# Patient Record
Sex: Female | Born: 1985
Health system: Southern US, Community
[De-identification: ages and names within clinical notes are randomized; demographics above are authoritative.]

## PROBLEM LIST (undated history)

## (undated) ENCOUNTER — Inpatient Hospital Stay (HOSPITAL_COMMUNITY): Payer: Self-pay

## (undated) DIAGNOSIS — J45909 Unspecified asthma, uncomplicated: Secondary | ICD-10-CM

## (undated) DIAGNOSIS — I517 Cardiomegaly: Secondary | ICD-10-CM

## (undated) DIAGNOSIS — G500 Trigeminal neuralgia: Secondary | ICD-10-CM

## (undated) HISTORY — DX: Unspecified asthma, uncomplicated: J45.909

## (undated) HISTORY — DX: Trigeminal neuralgia: G50.0

## (undated) HISTORY — PX: TOOTH EXTRACTION: SUR596

---
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
## 2007-04-13 ENCOUNTER — Emergency Department (HOSPITAL_COMMUNITY): Admission: EM | Admit: 2007-04-13 | Discharge: 2007-04-13 | Payer: Self-pay | Admitting: *Deleted

## 2007-10-12 ENCOUNTER — Emergency Department (HOSPITAL_COMMUNITY): Admission: EM | Admit: 2007-10-12 | Discharge: 2007-10-12 | Payer: Self-pay | Admitting: Emergency Medicine

## 2010-10-18 NOTE — Op Note (Signed)
[place]  Patient:    Nicole Brooks, Nicole Brooks Visit Number: [id number] MRN: [id number]          Service Type: EMS Location: ED Attending Physician:  Corlis Leak. Dictated by:   Angelia Mould. Derrell Lolling, M.D. Proc. Date: 04/22/01 Admit Date:  02/17/2001 Discharge Date: 02/17/2001   CC:         Dyanne Carrel, M.D.   Operative Report  PREOPERATIVE DIAGNOSIS:  Enlarging soft tissue mass, left buttock, suspect lipoma, 1.5 cm diameter.  POSTOPERATIVE DIAGNOSIS:  Enlarging soft tissue mass, left buttock, suspect lipoma, 1.5 cm diameter.  OPERATION PERFORMED:  Excision of 1.5 cm soft tissue mass from left buttock.  SURGEON:  Angelia Mould. Derrell Lolling, M.D.  ANESTHESIA:  INDICATIONS FOR PROCEDURE:  This is a 25 year old black female who comes with her mother.  There is a soft tissue mass on the left buttock that has been enlarging.  It is on exam a 1.5 to 2.0 cm diameter area of raised skin which is soft and compressible but quite noticeable.  The overlying skin is thinned out but not ulcerated or inflamed.  This feels like a lipoma.  Both the patient and her mother would like this excised because of the cosmetic problem and because of the concern for diagnosis because it has been enlarging.  DESCRIPTION OF PROCEDURE:  The patient was brought to the minor procedure room at [facility] and placed prone.  The left buttock was prepped and draped in sterile fashion.  1% Xylocaine with epinephrine was used as a local infiltration anesthetic.  An oblique elliptical incision was made around this mass.  This incision was parallel to the gluteal crease. Dissection was carried down into the subcutaneous tissues where I excised a fatty mass consistent with a benign lipoma.  This was aggressively removed and sent for pathologic exam.  Hemostasis was excellent.  The skin was closed with a running subcuticular suture of 3-0 Monocryl and  Steri-Strips.  Clean bandages were placed and the recovery room in stable condition.  Estimated blood loss was about 3 cc.  Complications were none.  Sponge, needle and instrument counts were correct. Dictated by:   Angelia Mould. Derrell Lolling, M.D. Attending Physician:  Corlis Leak DD:  04/22/01 TD:  04/22/01 Job: [id number]

## 2011-03-31 ENCOUNTER — Emergency Department (HOSPITAL_COMMUNITY)
Admission: EM | Admit: 2011-03-31 | Discharge: 2011-03-31 | Disposition: A | Discharge status: Home / Self Care | Payer: Self-pay | Attending: Emergency Medicine | Admitting: Emergency Medicine

## 2011-03-31 ENCOUNTER — Emergency Department (HOSPITAL_COMMUNITY): Payer: Self-pay

## 2011-03-31 DIAGNOSIS — R079 Chest pain, unspecified: Secondary | ICD-10-CM | POA: Insufficient documentation

## 2011-03-31 DIAGNOSIS — R071 Chest pain on breathing: Secondary | ICD-10-CM | POA: Insufficient documentation

## 2011-03-31 DIAGNOSIS — R51 Headache: Secondary | ICD-10-CM | POA: Insufficient documentation

## 2011-03-31 DIAGNOSIS — R0602 Shortness of breath: Secondary | ICD-10-CM | POA: Insufficient documentation

## 2011-03-31 DIAGNOSIS — J45909 Unspecified asthma, uncomplicated: Secondary | ICD-10-CM | POA: Insufficient documentation

## 2011-03-31 LAB — DIFFERENTIAL
Basophils Absolute: 0 10*3/uL (ref 0.0–0.1)
Basophils Relative: 0 % (ref 0–1)
Eosinophils Absolute: 0.1 10*3/uL (ref 0.0–0.7)
Eosinophils Relative: 1 % (ref 0–5)
Lymphocytes Relative: 26 % (ref 12–46)
Lymphs Abs: 2 10*3/uL (ref 0.7–4.0)
Monocytes Absolute: 0.7 10*3/uL (ref 0.1–1.0)
Monocytes Relative: 9 % (ref 3–12)
Neutro Abs: 4.8 10*3/uL (ref 1.7–7.7)
Neutrophils Relative %: 63 % (ref 43–77)

## 2011-03-31 LAB — CBC
HCT: 37.8 % (ref 36.0–46.0)
Hemoglobin: 12.3 g/dL (ref 12.0–15.0)
MCH: 28.3 pg (ref 26.0–34.0)
MCHC: 32.5 g/dL (ref 30.0–36.0)
MCV: 87.1 fL (ref 78.0–100.0)
Platelets: 300 10*3/uL (ref 150–400)
RBC: 4.34 MIL/uL (ref 3.87–5.11)
RDW: 12.9 % (ref 11.5–15.5)
WBC: 7.6 10*3/uL (ref 4.0–10.5)

## 2011-03-31 LAB — BASIC METABOLIC PANEL
BUN: 12 mg/dL (ref 6–23)
CO2: 29 mEq/L (ref 19–32)
Chloride: 100 mEq/L (ref 96–112)
Creatinine, Ser: 0.61 mg/dL (ref 0.50–1.10)
Glucose, Bld: 99 mg/dL (ref 70–99)

## 2012-01-26 ENCOUNTER — Encounter (HOSPITAL_COMMUNITY): Payer: Self-pay | Admitting: Emergency Medicine

## 2012-01-26 ENCOUNTER — Emergency Department (HOSPITAL_COMMUNITY)
Admission: EM | Admit: 2012-01-26 | Discharge: 2012-01-26 | Disposition: A | Discharge status: Home / Self Care | Payer: No Typology Code available for payment source | Attending: Emergency Medicine | Admitting: Emergency Medicine

## 2012-01-26 DIAGNOSIS — Y92410 Unspecified street and highway as the place of occurrence of the external cause: Secondary | ICD-10-CM | POA: Insufficient documentation

## 2012-01-26 DIAGNOSIS — S139XXA Sprain of joints and ligaments of unspecified parts of neck, initial encounter: Secondary | ICD-10-CM | POA: Insufficient documentation

## 2012-01-26 DIAGNOSIS — Y998 Other external cause status: Secondary | ICD-10-CM | POA: Insufficient documentation

## 2012-01-26 DIAGNOSIS — S161XXA Strain of muscle, fascia and tendon at neck level, initial encounter: Secondary | ICD-10-CM

## 2012-01-26 DIAGNOSIS — Y93I9 Activity, other involving external motion: Secondary | ICD-10-CM | POA: Insufficient documentation

## 2012-01-26 MED ORDER — IBUPROFEN 800 MG PO TABS: 800.0000 mg | ORAL_TABLET | Freq: Three times a day (TID) | ORAL | Status: AC

## 2012-01-26 MED ORDER — CYCLOBENZAPRINE HCL 10 MG PO TABS
5.0000 mg | ORAL_TABLET | Freq: Once | ORAL | Status: AC
  Administered 2012-01-26: 5 mg via ORAL
  Filled 2012-01-26: qty 1

## 2012-01-26 MED ORDER — ACETAMINOPHEN-CODEINE #3 300-30 MG PO TABS: 1.0000 | ORAL_TABLET | Freq: Four times a day (QID) | ORAL | Status: AC | PRN

## 2012-01-26 MED ORDER — IBUPROFEN 400 MG PO TABS
600.0000 mg | ORAL_TABLET | Freq: Once | ORAL | Status: AC
  Administered 2012-01-26: 600 mg via ORAL
  Filled 2012-01-26: qty 1

## 2012-01-26 MED ORDER — CYCLOBENZAPRINE HCL 5 MG PO TABS: 5.0000 mg | ORAL_TABLET | Freq: Three times a day (TID) | ORAL | Status: AC | PRN
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
## 2012-01-26 NOTE — Progress Notes (Signed)
Orthopedic Tech Progress Note Patient Details:  Nicole Brooks February 24, 1986 [id number] Cervical collar applied, care instruction given Ortho Devices Type of Ortho Device: Aspen cervical collar Ortho Device/Splint Location: Cervial collar applied Ortho Device/Splint Interventions: Application   Asia R Thompson 01/26/2012, 11:35 AM
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
## 2012-02-10 ENCOUNTER — Ambulatory Visit: Payer: No Typology Code available for payment source | Admitting: Physical Therapy

## 2012-02-17 ENCOUNTER — Ambulatory Visit: Payer: No Typology Code available for payment source | Attending: Sports Medicine | Admitting: Physical Therapy

## 2012-02-17 DIAGNOSIS — R293 Abnormal posture: Secondary | ICD-10-CM | POA: Insufficient documentation

## 2012-02-17 DIAGNOSIS — M2550 Pain in unspecified joint: Secondary | ICD-10-CM | POA: Insufficient documentation

## 2012-02-17 DIAGNOSIS — IMO0001 Reserved for inherently not codable concepts without codable children: Secondary | ICD-10-CM | POA: Insufficient documentation

## 2012-02-23 ENCOUNTER — Ambulatory Visit: Payer: No Typology Code available for payment source | Admitting: Physical Therapy

## 2012-02-25 ENCOUNTER — Encounter: Payer: Self-pay | Admitting: Physical Therapy

## 2012-02-26 ENCOUNTER — Encounter: Payer: Self-pay | Admitting: Physical Therapy

## 2012-03-02 ENCOUNTER — Ambulatory Visit: Payer: No Typology Code available for payment source | Attending: Sports Medicine | Admitting: Physical Therapy

## 2012-03-02 DIAGNOSIS — IMO0001 Reserved for inherently not codable concepts without codable children: Secondary | ICD-10-CM | POA: Insufficient documentation

## 2012-03-02 DIAGNOSIS — R293 Abnormal posture: Secondary | ICD-10-CM | POA: Insufficient documentation

## 2012-03-02 DIAGNOSIS — M2550 Pain in unspecified joint: Secondary | ICD-10-CM | POA: Insufficient documentation

## 2012-03-08 ENCOUNTER — Ambulatory Visit: Payer: No Typology Code available for payment source

## 2012-03-15 ENCOUNTER — Ambulatory Visit: Payer: No Typology Code available for payment source | Admitting: Rehabilitative and Restorative Service Providers"

## 2012-03-17 ENCOUNTER — Ambulatory Visit: Payer: No Typology Code available for payment source | Admitting: Rehabilitation

## 2012-03-23 ENCOUNTER — Ambulatory Visit: Payer: No Typology Code available for payment source | Admitting: Rehabilitation

## 2012-04-15 ENCOUNTER — Encounter (HOSPITAL_COMMUNITY): Payer: Self-pay | Admitting: Emergency Medicine

## 2012-04-15 ENCOUNTER — Emergency Department (HOSPITAL_COMMUNITY)
Admission: EM | Admit: 2012-04-15 | Discharge: 2012-04-15 | Disposition: A | Discharge status: Home / Self Care | Payer: Medicaid Other | Attending: Emergency Medicine | Admitting: Emergency Medicine

## 2012-04-15 DIAGNOSIS — Z3490 Encounter for supervision of normal pregnancy, unspecified, unspecified trimester: Secondary | ICD-10-CM

## 2012-04-15 DIAGNOSIS — Z3201 Encounter for pregnancy test, result positive: Secondary | ICD-10-CM | POA: Insufficient documentation

## 2012-04-15 DIAGNOSIS — N949 Unspecified condition associated with female genital organs and menstrual cycle: Secondary | ICD-10-CM | POA: Insufficient documentation

## 2012-04-15 DIAGNOSIS — N938 Other specified abnormal uterine and vaginal bleeding: Secondary | ICD-10-CM | POA: Insufficient documentation

## 2012-04-15 MED ORDER — FOLIC ACID 800 MCG PO TABS: 800.0000 ug | ORAL_TABLET | Freq: Every day | ORAL | Status: DC

## 2012-04-15 NOTE — ED Notes (Addendum)
Pt states she has taken 5 positive home pregnancy tests, "wants to make sure she is really pregnant". C/o abd pain for several days "when I'm sleeping".

## 2012-04-15 NOTE — ED Provider Notes (Signed)
History     CSN: [id number]  Arrival date & time 04/15/12  8657   First MD Initiated Contact with Patient 04/15/12 [phone]      Chief Complaint  Patient presents with  . Possible Pregnancy    (Consider location/radiation/quality/duration/timing/severity/associated sxs/prior treatment) HPI Comments: Patient presents with request for pregnancy test. Patient states that she took 5 tests at home but wanted confirmation from a hospital. Patient is currently taking prenatal vitamins. Denies abdominal pain, vaginal bleeding, or vaginal discharge. LMP: September 15th.  The history is provided by the patient. No language interpreter was used.    History reviewed. No pertinent past medical history.  History reviewed. No pertinent past surgical history.  History reviewed. No pertinent family history.  History  Substance Use Topics  . Smoking status: Never Smoker   . Smokeless tobacco: Not on file  . Alcohol Use: Yes     Comment: occasional    OB History    Grav Para Term Preterm Abortions TAB SAB Ect Mult Living                  Review of Systems  Gastrointestinal: Negative for abdominal pain.  Genitourinary: Positive for menstrual problem. Negative for vaginal bleeding and vaginal discharge.    Allergies  Review of patient's allergies indicates no known allergies.  Home Medications   Current Outpatient Rx  Name  Route  Sig  Dispense  Refill  . PRENATAL MULTIVITAMIN CH   Oral   Take 1 tablet by mouth daily.           BP 110/63  Pulse 95  Temp 98.4 F (36.9 C) (Oral)  Resp 16  SpO2 100%  LMP 01/14/2012  Physical Exam  Constitutional: She appears well-developed and well-nourished.  HENT:  Head: Normocephalic and atraumatic.  Mouth/Throat: Oropharynx is clear and moist.  Eyes: Conjunctivae normal and EOM are normal. No scleral icterus.  Neck: Normal range of motion. Neck supple.  Cardiovascular: Normal rate, regular rhythm and normal heart sounds.     Pulmonary/Chest: Effort normal and breath sounds normal.  Abdominal: Soft. Bowel sounds are normal. There is no tenderness.  Neurological: She is alert.  Skin: Skin is warm and dry.    ED Course  Procedures (including critical care time)  Labs Reviewed - No data to display No results found. Results for orders placed during the hospital encounter of 04/15/12  POCT PREGNANCY, URINE      Component Value Range   Preg Test, Ur POSITIVE (*) NEGATIVE     1. Pregnancy       MDM  Patient presented with request for confirmatory pregnancy test. Patient informed of POC results. Patient given Rx for supplemental folic acid and to continue taking prenatal vitamins. Referred to [facility] for first OB visit. Discharged with return precautions.         Pixie Casino, PA-C 04/15/12 1034

## 2012-04-16 NOTE — ED Provider Notes (Signed)
Medical screening examination/treatment/procedure(s) were performed by non-physician practitioner and as supervising physician I was immediately available for consultation/collaboration.   Tor Tsuda, MD 04/16/12 0809 

## 2012-05-12 ENCOUNTER — Ambulatory Visit (INDEPENDENT_AMBULATORY_CARE_PROVIDER_SITE_OTHER): Payer: Self-pay | Admitting: Obstetrics & Gynecology

## 2012-05-12 ENCOUNTER — Encounter: Payer: Self-pay | Admitting: Advanced Practice Midwife

## 2012-05-12 VITALS — BP 122/78 | Temp 99.2°F | Ht 61.0 in | Wt 176.0 lb

## 2012-05-12 DIAGNOSIS — B353 Tinea pedis: Secondary | ICD-10-CM

## 2012-05-12 DIAGNOSIS — Z3400 Encounter for supervision of normal first pregnancy, unspecified trimester: Secondary | ICD-10-CM

## 2012-05-12 DIAGNOSIS — O021 Missed abortion: Secondary | ICD-10-CM | POA: Insufficient documentation

## 2012-05-12 DIAGNOSIS — Z23 Encounter for immunization: Secondary | ICD-10-CM

## 2012-05-12 DIAGNOSIS — Z3490 Encounter for supervision of normal pregnancy, unspecified, unspecified trimester: Secondary | ICD-10-CM

## 2012-05-12 DIAGNOSIS — G43909 Migraine, unspecified, not intractable, without status migrainosus: Secondary | ICD-10-CM | POA: Insufficient documentation

## 2012-05-12 LAB — POCT URINALYSIS DIP (DEVICE)
Leukocytes, UA: NEGATIVE
Protein, ur: NEGATIVE mg/dL
Specific Gravity, Urine: 1.025 (ref 1.005–1.030)
pH: 6.5 (ref 5.0–8.0)

## 2012-05-12 MED ORDER — INFLUENZA VIRUS VACC SPLIT PF IM SUSP: 0.5000 mL | Freq: Once | INTRAMUSCULAR | Status: DC

## 2012-05-12 MED ORDER — CLOTRIMAZOLE-BETAMETHASONE 1-0.05 % EX CREA: TOPICAL_CREAM | Freq: Two times a day (BID) | CUTANEOUS | Status: DC

## 2012-05-12 MED ORDER — BUTALBITAL-APAP-CAFFEINE 50-325-40 MG PO TABS: 1.0000 | ORAL_TABLET | Freq: Two times a day (BID) | ORAL | Status: DC | PRN

## 2012-05-12 NOTE — Progress Notes (Signed)
   Subjective:    Nicole Brooks is a G2P0010 [redacted]w[redacted]d being seen today for her first obstetrical visit.  Her obstetrical history is significant for None. Patient does not intend to breast feed. Pregnancy history fully reviewed.  Patient reports Headaches with photo and scent sensitivity, and athletes foot. Marland Kitchen Headaches daily for 1 month, onset over and hour or so and last all fday long, some nausea previously, + photo and scent sensitivity.  Family Hx + for gestational diabetes, T2DM, and HTN  Filed Vitals:   05/12/12 0923 05/12/12 0927  BP: 122/78   Temp: 99.2 F (37.3 C)   Height:  5\' 1"  (1.549 m)  Weight: 176 lb (79.833 kg)     HISTORY: OB History    Grav Para Term Preterm Abortions TAB SAB Ect Mult Living   2 0 0 0 1 0 1 0 0 0      # Outc Date GA Lbr Len/2nd Wgt Sex Del Anes PTL Lv   1 SAB 2011           2 CUR              Past Medical History  Diagnosis Date  . Asthma    Past Surgical History  Procedure Date  . No past surgeries    Family History  Problem Relation Age of Onset  . Diabetes Mother   . Hypertension Mother      Exam    Uterus:     Pelvic Exam:    Perineum: Normal Perineum   Vulva: normal   Vagina:  Deferred   pH: Deferred   Cervix: Deferred   Adnexa: Deferred   Bony Pelvis: Deferred  System: Breast:  Deferred   Skin: normal coloration and turgor, no rashes    Neurologic: oriented, normal, grossly non-focal   Extremities: no deformities, No edema, areas of fine scale and hypopigmentation between toes BL   HEENT PERRLA, extra ocular movement intact and sclera clear, anicteric   Mouth/Teeth mucous membranes moist, pharynx normal without lesions   Neck supple and no masses   Cardiovascular: regular rate and rhythm, no murmurs or gallops   Respiratory:  appears well, vitals normal, no respiratory distress, acyanotic, normal RR, ear and throat exam is normal, neck free of mass or lymphadenopathy, chest clear, no wheezing, crepitations, rhonchi,  normal symmetric air entry   Abdomen: Pregnant, fundal height 2 finger breadths below umbilicus   Urinary: urethral meatus normal      Assessment:    Pregnancy: G2P0010 Patient Active Problem List  Diagnosis  . Supervision of normal pregnancy        Plan:     Initial labs drawn. Prenatal vitamins. Problem list reviewed and updated. Genetic Screening discussed Quad Screen: Deferred- see below.  Ultrasound discussed; fetal survey: Deferred- see below. .  Follow up in 4 weeks. 50% of 30 min visit spent on counseling and coordination of care.  Urine with trace blood and protein- Culture Headaches likely migraine with duration and photosensitivity- will Treat with Fioricet RX given Athletes foot for years, Lamasil PO not an option with pregnancy, has used lamasil cream for years. Will give Rx for Lotrizone cream, also suggest Tea tree oil.   Missed AB- Fetal heart tone not audible with doppler US, Bedside US confirms approx 12 week 4 days by CRL and no heartbeat. Will plan D&C.   Kevin Fenton 05/12/2012

## 2012-05-12 NOTE — Progress Notes (Signed)
P = 88  Patient complains of headaches.

## 2012-05-12 NOTE — Progress Notes (Signed)
Pt with missed Ab @12  weeks.  D/W pt method of delivery.  She has opted for D&C.  Will schedule.   Nicole Brooks L. Harraway-Smith, M.D., [place]

## 2012-05-12 NOTE — Patient Instructions (Addendum)
Incomplete Miscarriage Miscarriages in pregnancy are common. A miscarriage is a pregnancy that has ended before the twentieth week. You have had an incomplete miscarriage. Partial parts of the fetus or placenta (afterbirth) remain behind. Sometimes further treatment is needed. The most common reason for further treatment is continued bleeding (hemorrhage). Tissue left behind may also become infected. Treatment usually is curettage. Curettage for an incomplete abortion is a procedure in which the remaining products of pregnancy are removed. This can be done by a simple sucking procedure (suction curettage). It can also be done by a simple scraping (curettage) of the inside of the uterus (womb). This may be done in the hospital or in the caregiver's office. This is only done when your caregiver knows the pregnancy has ended. This is determined by physical examination and a negative pregnancy test. It may also include an ultrasound to confirm a dead fetus. The ultrasound may also prove that products of the pregnancy remain in the uterus. If your cervix remains dilated and you are still passing clots and tissue, your caregiver may wish to watch you for a little while. Your caregiver may want to see if you are going to finish passing all of the remaining parts of the pregnancy. If the bleeding continues, they may proceed with curettage. WHY DO I FEEL THIS WAY Miscarriages can be a very emotional time for prospective mothers. This is not you or your partner's fault. The miscarriage did not occur because of a lack in you or your partner. Nearly all miscarriages occur because the pregnancy has started off wrongly. At least half of miscarried pregnancies have a chromosomal abnormality (almost always not inherited). Others may have developmental problems with the fetus or placentas. Problems may not show up even when the products miscarried are studied under the microscope. You can usually begin trying for another  pregnancy as soon as your caregiver says it's okay. HOME CARE INSTRUCTIONS   Your caregiver may order bed rest (this means only getting up to use the bathroom). Your caregiver may allow you to continue light activity. If curettage was not done at this time, but you require further treatment.  Keep track of the number of pads you use each day. Keep track of how saturated (soaked) they are. Record this information.  Do not use tampons. Do not douche or have sexual intercourse until approved by your caregiver.  It is very important to keep all follow-up appointments for re-evaluation and continuing management.  Women who have an Rh negative blood type (ie, A, B, AB, or O negative) need to receive a drug called Rh(D) immune globulin. This medicine helps protect future fetuses against problems that can occur if an Rh negative mother is carrying a baby who is Rh positive. SEEK IMMEDIATE MEDICAL CARE IF:   You experience severe cramps in your stomach, back, or abdomen.  You run an unexplained temperature (record these).  You pass large clots or tissue (save any tissue for your caregiver to inspect).  Your bleeding increases or you become light-headed, weak, or have fainting episodes. MAKE SURE YOU:   Understand these instructions.  Will watch your condition.  Will get help right away if you are not doing well or get worse. Document Released: 05/19/2005 Document Revised: 08/11/2011 Document Reviewed: 01/07/2008 [facility] Patient Information 2013 [place], [place]. Recurrent Miscarriage Recurrent miscarriage means that a woman has lost two or more pregnancies in a row. The loss happens before 20 weeks of pregnancy. Primary recurrent miscarriage is with a woman  that has never been able to give birth to a child. Secondary recurrent miscarriage is a woman who had a child and then had two or more miscarriages in a row.  CAUSES   Your parents passed it to you (genetic).  Chromosomal  defects.  Endocrine disorders. A person's endocrine system includes glands that make hormones.  Having a lack of progesterone hormone in early pregnancy.  Thyroid problems.  Insulin resistance seen in diabetic women and women with Polycystic Ovary Syndrome that is hard to control.  Abnormalities of the uterus.  Certain viral and germ (bacterial) infections.  Autoimmune disorders. This is when the immune system attacks or destroys healthy body tissue.  Smoking, drinking or taking drugs or medicines.  Being exposed to certain chemicals or toxins at home or work. HOME CARE INSTRUCTIONS   See your caregiver if you have two or more miscarriages.  Follow the advice for testing and treatment that your caregiver recommends.  Discuss any concerns or questions with your caregiver.  Do not smoke or drink alcohol when pregnant.  Recurrent miscarriages can have a severe emotional and psychological effect on a couple wanting to have children. They may have feelings of anger, blame, guilt and become depressed after losing a pregnancy many times. Join a support group or see a grief counselor if you have emotional problems because of pregnancy loss. SEEK MEDICAL CARE IF:   You are or think you are pregnant and have any kind of vaginal spotting or bleeding.  You develop low abdominal cramps.  You develop a fever of 102 F (38.9 C) or higher.  You develop abnormal vaginal discharge.  You have been or think you have been exposed to a spreadable (contagious) illness, toxins or chemicals that make you sick to your stomach (nauseated) or throw up (vomit).  You think you have been exposed to a sexually transmitted disease. Document Released: 11/05/2007 Document Revised: 08/11/2011 Document Reviewed: 11/05/2007 [facility] Patient Information 2013 [place], [place].

## 2012-05-13 LAB — OBSTETRIC PANEL
Antibody Screen: NEGATIVE
Basophils Relative: 0 % (ref 0–1)
Eosinophils Absolute: 0.1 10*3/uL (ref 0.0–0.7)
HCT: 34.2 % — ABNORMAL LOW (ref 36.0–46.0)
Hemoglobin: 11.2 g/dL — ABNORMAL LOW (ref 12.0–15.0)
Lymphs Abs: 1.2 10*3/uL (ref 0.7–4.0)
MCH: 27.7 pg (ref 26.0–34.0)
MCHC: 32.7 g/dL (ref 30.0–36.0)
MCV: 84.4 fL (ref 78.0–100.0)
Monocytes Absolute: 0.5 10*3/uL (ref 0.1–1.0)
Monocytes Relative: 7 % (ref 3–12)
Rh Type: POSITIVE

## 2012-05-14 LAB — HEMOGLOBINOPATHY EVALUATION
Hemoglobin Other: 0 %
Hgb A2 Quant: 3 % (ref 2.2–3.2)
Hgb A: 96.6 % — ABNORMAL LOW (ref 96.8–97.8)
Hgb S Quant: 0 %

## 2012-05-14 LAB — CULTURE, OB URINE: Organism ID, Bacteria: NO GROWTH

## 2012-05-17 ENCOUNTER — Encounter (HOSPITAL_COMMUNITY): Payer: Self-pay | Admitting: Registered Nurse

## 2012-05-17 ENCOUNTER — Encounter (HOSPITAL_COMMUNITY)
Admission: RE | Disposition: A | Discharge status: Home / Self Care | Payer: Self-pay | Source: Ambulatory Visit | Attending: Obstetrics & Gynecology

## 2012-05-17 ENCOUNTER — Ambulatory Visit (HOSPITAL_COMMUNITY): Payer: Medicaid Other | Admitting: Registered Nurse

## 2012-05-17 ENCOUNTER — Encounter (HOSPITAL_COMMUNITY): Payer: Self-pay | Admitting: *Deleted

## 2012-05-17 ENCOUNTER — Ambulatory Visit (HOSPITAL_COMMUNITY)
Admission: RE | Admit: 2012-05-17 | Discharge: 2012-05-17 | Disposition: A | Discharge status: Home / Self Care | Payer: Medicaid Other | Source: Ambulatory Visit | Attending: Obstetrics & Gynecology | Admitting: Obstetrics & Gynecology

## 2012-05-17 DIAGNOSIS — O021 Missed abortion: Secondary | ICD-10-CM | POA: Insufficient documentation

## 2012-05-17 HISTORY — PX: DILATION AND EVACUATION: SHX1459

## 2012-05-17 LAB — CBC
HCT: 33.4 % — ABNORMAL LOW (ref 36.0–46.0)
MCHC: 33.5 g/dL (ref 30.0–36.0)
MCV: 83.5 fL (ref 78.0–100.0)
RDW: 12.2 % (ref 11.5–15.5)

## 2012-05-17 SURGERY — DILATION AND EVACUATION, UTERUS
Anesthesia: Monitor Anesthesia Care | Site: Vagina | Wound class: Clean Contaminated

## 2012-05-17 MED ORDER — PROPOFOL 10 MG/ML IV EMUL
INTRAVENOUS | Status: DC | PRN
  Administered 2012-05-17: 10 mg via INTRAVENOUS
  Administered 2012-05-17: 30 mg via INTRAVENOUS
  Administered 2012-05-17: 20 mg via INTRAVENOUS
  Administered 2012-05-17: 30 mg via INTRAVENOUS
  Administered 2012-05-17: 20 mg via INTRAVENOUS

## 2012-05-17 MED ORDER — ONDANSETRON HCL 4 MG/2ML IJ SOLN
INTRAMUSCULAR | Status: DC | PRN
  Administered 2012-05-17: 4 mg via INTRAVENOUS

## 2012-05-17 MED ORDER — KETOROLAC TROMETHAMINE 30 MG/ML IJ SOLN
INTRAMUSCULAR | Status: DC | PRN
  Administered 2012-05-17: 30 mg via INTRAVENOUS

## 2012-05-17 MED ORDER — ONDANSETRON HCL 4 MG/2ML IJ SOLN
INTRAMUSCULAR | Status: AC
  Filled 2012-05-17: qty 2

## 2012-05-17 MED ORDER — FENTANYL CITRATE 0.05 MG/ML IJ SOLN
INTRAMUSCULAR | Status: AC
  Filled 2012-05-17: qty 2

## 2012-05-17 MED ORDER — MIDAZOLAM HCL 5 MG/5ML IJ SOLN
INTRAMUSCULAR | Status: DC | PRN
  Administered 2012-05-17: 2 mg via INTRAVENOUS

## 2012-05-17 MED ORDER — DEXAMETHASONE SODIUM PHOSPHATE 10 MG/ML IJ SOLN
INTRAMUSCULAR | Status: DC | PRN
  Administered 2012-05-17: 10 mg via INTRAVENOUS

## 2012-05-17 MED ORDER — KETOROLAC TROMETHAMINE 30 MG/ML IJ SOLN: 15.0000 mg | Freq: Once | INTRAMUSCULAR | Status: DC | PRN

## 2012-05-17 MED ORDER — FENTANYL CITRATE 0.05 MG/ML IJ SOLN
INTRAMUSCULAR | Status: AC
  Administered 2012-05-17: 50 ug via INTRAVENOUS
  Filled 2012-05-17: qty 2

## 2012-05-17 MED ORDER — METHYLERGONOVINE MALEATE 0.2 MG/ML IJ SOLN
INTRAMUSCULAR | Status: DC | PRN
  Administered 2012-05-17: 0.2 mg via INTRAMUSCULAR

## 2012-05-17 MED ORDER — BUPIVACAINE HCL (PF) 0.5 % IJ SOLN
INTRAMUSCULAR | Status: AC
  Filled 2012-05-17: qty 30

## 2012-05-17 MED ORDER — LIDOCAINE HCL (CARDIAC) 20 MG/ML IV SOLN
INTRAVENOUS | Status: AC
  Filled 2012-05-17: qty 5

## 2012-05-17 MED ORDER — LIDOCAINE HCL (CARDIAC) 20 MG/ML IV SOLN
INTRAVENOUS | Status: DC | PRN
  Administered 2012-05-17: 50 mg via INTRAVENOUS

## 2012-05-17 MED ORDER — ONDANSETRON HCL 4 MG/2ML IJ SOLN: 4.0000 mg | Freq: Once | INTRAMUSCULAR | Status: DC | PRN

## 2012-05-17 MED ORDER — LACTATED RINGERS IV SOLN
INTRAVENOUS | Status: DC
  Administered 2012-05-17: 09:00:00 via INTRAVENOUS

## 2012-05-17 MED ORDER — OXYCODONE-ACETAMINOPHEN 5-325 MG PO TABS: 1.0000 | ORAL_TABLET | Freq: Four times a day (QID) | ORAL | Status: DC | PRN

## 2012-05-17 MED ORDER — IBUPROFEN 600 MG PO TABS: 600.0000 mg | ORAL_TABLET | Freq: Four times a day (QID) | ORAL | Status: DC | PRN

## 2012-05-17 MED ORDER — DOXYCYCLINE HYCLATE 100 MG PO CAPS: 100.0000 mg | ORAL_CAPSULE | Freq: Two times a day (BID) | ORAL | Status: DC

## 2012-05-17 MED ORDER — PROPOFOL 10 MG/ML IV EMUL
INTRAVENOUS | Status: AC
  Filled 2012-05-17: qty 20

## 2012-05-17 MED ORDER — LACTATED RINGERS IV SOLN
INTRAVENOUS | Status: DC
  Administered 2012-05-17 (×2): via INTRAVENOUS

## 2012-05-17 MED ORDER — MIDAZOLAM HCL 2 MG/2ML IJ SOLN
INTRAMUSCULAR | Status: AC
  Filled 2012-05-17: qty 2

## 2012-05-17 MED ORDER — FENTANYL CITRATE 0.05 MG/ML IJ SOLN
INTRAMUSCULAR | Status: DC | PRN
  Administered 2012-05-17 (×2): 50 ug via INTRAVENOUS
  Administered 2012-05-17: 100 ug via INTRAVENOUS

## 2012-05-17 MED ORDER — METHYLERGONOVINE MALEATE 0.2 MG/ML IJ SOLN
INTRAMUSCULAR | Status: AC
  Filled 2012-05-17: qty 1

## 2012-05-17 MED ORDER — BUPIVACAINE HCL (PF) 0.5 % IJ SOLN
INTRAMUSCULAR | Status: DC | PRN
  Administered 2012-05-17: 22 mL

## 2012-05-17 MED ORDER — KETOROLAC TROMETHAMINE 30 MG/ML IJ SOLN
INTRAMUSCULAR | Status: AC
  Filled 2012-05-17: qty 1

## 2012-05-17 MED ORDER — MEPERIDINE HCL 25 MG/ML IJ SOLN: 6.2500 mg | INTRAMUSCULAR | Status: DC | PRN

## 2012-05-17 MED ORDER — FENTANYL CITRATE 0.05 MG/ML IJ SOLN
25.0000 ug | INTRAMUSCULAR | Status: DC | PRN
  Administered 2012-05-17: 50 ug via INTRAVENOUS

## 2012-05-17 SURGICAL SUPPLY — 23 items
CATH ROBINSON RED A/P 16FR (CATHETERS) ×2 IMPLANT
CLOTH BEACON ORANGE TIMEOUT ST (SAFETY) ×2 IMPLANT
DECANTER SPIKE VIAL GLASS SM (MISCELLANEOUS) ×2 IMPLANT
GLOVE BIOGEL PI IND STRL 7.0 (GLOVE) ×1 IMPLANT
GLOVE BIOGEL PI INDICATOR 7.0 (GLOVE) ×1
GLOVE ECLIPSE 7.0 STRL STRAW (GLOVE) ×4 IMPLANT
GOWN PREVENTION PLUS XLARGE (GOWN DISPOSABLE) ×2 IMPLANT
GOWN STRL REIN XL XLG (GOWN DISPOSABLE) ×4 IMPLANT
KIT BERKELEY 1ST TRIMESTER 3/8 (MISCELLANEOUS) ×2 IMPLANT
NDL SPNL 22GX3.5 QUINCKE BK (NEEDLE) ×1 IMPLANT
NEEDLE SPNL 22GX3.5 QUINCKE BK (NEEDLE) ×2 IMPLANT
NS IRRIG 1000ML POUR BTL (IV SOLUTION) ×2 IMPLANT
PACK VAGINAL MINOR WOMEN LF (CUSTOM PROCEDURE TRAY) ×2 IMPLANT
PAD OB MATERNITY 4.3X12.25 (PERSONAL CARE ITEMS) ×2 IMPLANT
PAD PREP 24X48 CUFFED NSTRL (MISCELLANEOUS) ×2 IMPLANT
SET BERKELEY SUCTION TUBING (SUCTIONS) ×2 IMPLANT
SYR CONTROL 10ML LL (SYRINGE) ×2 IMPLANT
TOWEL OR 17X24 6PK STRL BLUE (TOWEL DISPOSABLE) ×4 IMPLANT
VACURETTE 10 RIGID CVD (CANNULA) IMPLANT
VACURETTE 12 RIGID CVD (CANNULA) ×1 IMPLANT
VACURETTE 7MM CVD STRL WRAP (CANNULA) IMPLANT
VACURETTE 8 RIGID CVD (CANNULA) IMPLANT
VACURETTE 9 RIGID CVD (CANNULA) IMPLANT

## 2012-05-17 NOTE — Anesthesia Preprocedure Evaluation (Signed)
Anesthesia Evaluation  Patient identified by MRN, date of birth, ID band Patient awake    Reviewed: Allergy & Precautions, H&P , NPO status , Patient's Chart, lab work & pertinent test results  Airway Mallampati: I TM Distance: >3 FB Neck ROM: full    Dental No notable dental hx.    Pulmonary    Pulmonary exam normal       Cardiovascular negative cardio ROS      Neuro/Psych negative psych ROS   GI/Hepatic negative GI ROS, Neg liver ROS,   Endo/Other  negative endocrine ROS  Renal/GU negative Renal ROS  negative genitourinary   Musculoskeletal negative musculoskeletal ROS (+)   Abdominal Normal abdominal exam  (+)   Peds  Hematology negative hematology ROS (+)   Anesthesia Other Findings   Reproductive/Obstetrics                           Anesthesia Physical Anesthesia Plan  ASA: II  Anesthesia Plan: MAC   Post-op Pain Management:    Induction: Intravenous  Airway Management Planned:   Additional Equipment:   Intra-op Plan:   Post-operative Plan:   Informed Consent: I have reviewed the patients History and Physical, chart, labs and discussed the procedure including the risks, benefits and alternatives for the proposed anesthesia with the patient or authorized representative who has indicated his/her understanding and acceptance.     Plan Discussed with: CRNA and Surgeon  Anesthesia Plan Comments:         Anesthesia Quick Evaluation

## 2012-05-17 NOTE — Transfer of Care (Signed)
Immediate Anesthesia Transfer of Care Note  Patient: Nicole Brooks  Procedure(s) Performed: Procedure(s) (LRB) with comments: DILATATION AND EVACUATION (N/A)  Patient Location: PACU  Anesthesia Type:MAC  Level of Consciousness: sedated  Airway & Oxygen Therapy: Patient Spontanous Breathing and Patient connected to nasal cannula oxygen  Post-op Assessment: Report given to PACU RN  Post vital signs: Reviewed and stable  Complications: No apparent anesthesia complications

## 2012-05-17 NOTE — Brief Op Note (Signed)
05/17/2012  9:29 AM  PATIENT:  Nicole Brooks  26 y.o. female  PRE-OPERATIVE DIAGNOSIS:  12 weeks;missed ab  POST-OPERATIVE DIAGNOSIS:  missed abortion  PROCEDURE:  Procedure(s) (LRB) with comments: DILATATION AND EVACUATION (N/A)  SURGEON:  Surgeon(s) and Role:    * Willodean Rosenthal, MD - Primary  ANESTHESIA:   general and paracervical block  EBL:  Total I/O In: 1000 [I.V.:1000] Out: 300 [Urine:100; Blood:200]  BLOOD ADMINISTERED:none  DRAINS: none   LOCAL MEDICATIONS USED:  MARCAINE     SPECIMEN:  Source of Specimen:  products of conception  DISPOSITION OF SPECIMEN:  PATHOLOGY  COUNTS:  YES  TOURNIQUET:  * No tourniquets in log *  DICTATION: .Note written in EPIC  PLAN OF CARE: Discharge to home after PACU  PATIENT DISPOSITION:  PACU - hemodynamically stable.   Delay start of Pharmacological VTE agent (>24hrs) due to surgical blood loss or risk of bleeding: not applicable

## 2012-05-17 NOTE — Anesthesia Postprocedure Evaluation (Signed)
Anesthesia Post Note  Patient: Nicole Brooks  Procedure(s) Performed: Procedure(s) (LRB): DILATATION AND EVACUATION (N/A)  Anesthesia type: MAC  Patient location: PACU  Post pain: Pain level controlled  Post assessment: Post-op Vital signs reviewed  Last Vitals:  Filed Vitals:   05/17/12 0935  BP: 120/68  Pulse:   Temp: 36.8 C  Resp: 16    Post vital signs: Reviewed  Level of consciousness: sedated  Complications: No apparent anesthesia complications

## 2012-05-17 NOTE — Op Note (Signed)
Nicole Brooks PROCEDURE DATE: 05/17/2012  PREOPERATIVE DIAGNOSIS: 12 week missed abortion POSTOPERATIVE DIAGNOSIS: The same PROCEDURE:     Dilation and Evacuation SURGEON:  Dr. Eber Jones L. Harraway-Smith  INDICATIONS: 26 y.o. G2P0010 with MAB at [redacted] weeks gestation   Risks of surgery were discussed with the patient including but not limited to: bleeding which may require transfusion; infection which may require antibiotics; injury to uterus or surrounding organs; need for additional procedures including laparotomy or laparoscopy; possibility of intrauterine scarring which may impair future fertility; and other postoperative/anesthesia complications. Written informed consent was obtained.    FINDINGS:  A 12week size uterus, moderate amounts of products of conception, specimen sent to pathology.  ANESTHESIA:    Monitored intravenous sedation, paracervical block. INTRAVENOUS FLUIDS:  1000 ml of LR ESTIMATED BLOOD LOSS:  Less than 20 ml. SPECIMENS:  Products of conception sent to pathology COMPLICATIONS:  None immediate.  PROCEDURE DETAILS:  The patient was taken to the operating room where monitored intravenous sedation was administered and was found to be adequate.  After an adequate timeout was performed, she was placed in the dorsal lithotomy position and examined; then prepped and draped in the sterile manner.   Her bladder was catheterized for an unmeasured amount of clear, yellow urine. A vaginal speculum was then placed in the patient's vagina and a single tooth tenaculum was applied to the anterior lip of the cervix.  A paracervical block using 20 ml of 0.5% Marcaine was administered. The cervix was gently dilated to accommodate a 12 mm curved suction curette that was gently advanced to the uterine fundus.  The suction device was then activated to a suction of 50-84mmHg and curette slowly rotated to clear the uterus of products of conception.  A sharp curettage was then performed to confirm  complete emptying of the uterus. There was minimal bleeding noted and the tenaculum removed with good hemostasis noted.   All instruments were removed from the patient's vagina. The patient tolerated the procedure well and was taken to the recovery area awake, and in stable condition.  The patient will be discharged to home as per PACU criteria.  Routine postoperative instructions given.  She was prescribed Percocet, Ibuprofen and Doxycycline.  She will follow up in the clinic on 2-3 for postoperative evaluation.

## 2012-05-17 NOTE — H&P (Signed)
  Nicole Brooks is a G2P0010 @12weeks  by CRL with missed abortion.   Family Hx + for gestational diabetes, T2DM, and HTN  Filed Vitals:    05/12/12 0923  05/12/12 0927   BP:  122/78    Temp:  99.2 F (37.3 C)    Height:   5\' 1"  (1.549 m)   Weight:  176 lb (79.833 kg)    HISTORY:  OB History    Grav  Para  Term  Preterm  Abortions  TAB  SAB  Ect  Mult  Living    2  0  0  0  1  0  1  0  0  0      #  Outc  Date  GA  Lbr Len/2nd  Wgt  Sex  Del  Anes  PTL  Lv    1  SAB  2011            2  CUR               Past Medical History   Diagnosis  Date   .  Asthma     Past Surgical History   Procedure  Date   .  No past surgeries     Family History   Problem  Relation  Age of Onset   .  Diabetes  Mother    .  Hypertension  Mother     Exam    Uterus:    Pelvic Exam:     Perineum:  Normal Perineum    Vulva:  normal    Vagina:  Deferred    pH:  Deferred    Cervix:  Deferred    Adnexa:  Deferred    Bony Pelvis:  Deferred   System:  Breast:  Deferred    Skin:  normal coloration and turgor, no rashes    Neurologic:  oriented, normal, grossly non-focal    Extremities:  no deformities, No edema, areas of fine scale and hypopigmentation between toes BL    HEENT  PERRLA, extra ocular movement intact and sclera clear, anicteric    Mouth/Teeth  mucous membranes moist, pharynx normal without lesions    Neck  supple and no masses    Cardiovascular:  regular rate and rhythm, no murmurs or gallops    Respiratory:  appears well, vitals normal, no respiratory distress, acyanotic, normal RR, ear and throat exam is normal, neck free of mass or lymphadenopathy, chest clear, no wheezing, crepitations, rhonchi, normal symmetric air entry    Abdomen:  Pregnant, fundal height 2 finger breadths below umbilicus    Urinary:  urethral meatus normal    Assessment:   Pregnancy: G2P0010  Patient Active Problem List   Diagnosis   .  Missed Abortion at 12 weeks   Plan:   D&C for missed abortion D/C  to home after procedure   Nicole Brooks, M.D., [facility]

## 2012-05-18 ENCOUNTER — Encounter (HOSPITAL_COMMUNITY): Payer: Self-pay | Admitting: Obstetrics & Gynecology

## 2012-06-09 ENCOUNTER — Encounter: Payer: Self-pay | Admitting: Family Medicine

## 2012-06-09 ENCOUNTER — Encounter: Payer: Self-pay | Admitting: Obstetrics & Gynecology

## 2012-06-09 LAB — POCT URINALYSIS DIP (DEVICE)
Bilirubin Urine: NEGATIVE
Glucose, UA: NEGATIVE mg/dL
Specific Gravity, Urine: 1.025 (ref 1.005–1.030)

## 2012-06-17 ENCOUNTER — Encounter: Payer: Self-pay | Admitting: Obstetrics & Gynecology

## 2012-06-17 ENCOUNTER — Ambulatory Visit: Payer: Self-pay | Admitting: Obstetrics & Gynecology

## 2012-06-17 ENCOUNTER — Ambulatory Visit (INDEPENDENT_AMBULATORY_CARE_PROVIDER_SITE_OTHER): Payer: Self-pay | Admitting: Obstetrics & Gynecology

## 2012-06-17 VITALS — BP 112/78 | HR 79 | Temp 98.3°F | Ht 60.0 in | Wt 177.3 lb

## 2012-06-17 DIAGNOSIS — O021 Missed abortion: Secondary | ICD-10-CM

## 2012-06-17 DIAGNOSIS — Z3049 Encounter for surveillance of other contraceptives: Secondary | ICD-10-CM

## 2012-06-17 DIAGNOSIS — Z309 Encounter for contraceptive management, unspecified: Secondary | ICD-10-CM

## 2012-06-17 MED ORDER — MEDROXYPROGESTERONE ACETATE 150 MG/ML IM SUSP
150.0000 mg | INTRAMUSCULAR | Status: DC
  Administered 2012-06-17: 150 mg via INTRAMUSCULAR

## 2012-06-17 NOTE — Progress Notes (Signed)
Subjective:     Patient ID: Nicole Brooks, female   DOB: 1985/06/30, 27 y.o.   MRN: [id number]  HPI Pt s/p D&C for missed AB.  Here for f/u.  No problems noted.  Does not desire pregnancy immediately want to try Depo Provera.   Review of Systems     Objective:   Physical ExamBP 112/78  Pulse 79  Temp 98.3 F (36.8 C) (Oral)  Ht 5' (1.524 m)  Wt 177 lb 4.8 oz (80.423 kg)  BMI 34.63 kg/m2  LMP 06/15/2012  Breastfeeding? No Pt in NAD GU: EGBUS: no lesions Vagina: no blood in vault Cervix: no lesion; no mucopurulent d/c Uterus: small, mobile Adnexa: no masses;non tender      05/17/12 Products of Conception - CHORIONIC VILLI CONSISTENT WITH PRODUCTS OF CONCEPTION.     Assessment:    h/o Missed AB s/p D&C Contraception management- desires Depo Provera     Plan:     Depo Provera today F/u 3months for Depo

## 2012-06-17 NOTE — Patient Instructions (Signed)

## 2012-07-05 NOTE — Progress Notes (Signed)
This encounter was created in error - please disregard.

## 2012-08-22 ENCOUNTER — Emergency Department (HOSPITAL_COMMUNITY)
Admission: EM | Admit: 2012-08-22 | Discharge: 2012-08-22 | Disposition: A | Discharge status: Home / Self Care | Payer: Self-pay | Source: Home / Self Care | Attending: Family Medicine | Admitting: Family Medicine

## 2012-09-09 ENCOUNTER — Ambulatory Visit: Payer: Self-pay | Admitting: Obstetrics & Gynecology

## 2012-09-15 ENCOUNTER — Ambulatory Visit: Payer: Self-pay

## 2013-07-02 ENCOUNTER — Emergency Department (HOSPITAL_COMMUNITY)
Admission: EM | Admit: 2013-07-02 | Discharge: 2013-07-02 | Disposition: A | Discharge status: Home / Self Care | Payer: Medicaid Other | Attending: Emergency Medicine | Admitting: Emergency Medicine

## 2013-07-02 ENCOUNTER — Encounter (HOSPITAL_COMMUNITY): Payer: Self-pay | Admitting: Emergency Medicine

## 2013-07-02 ENCOUNTER — Emergency Department (HOSPITAL_COMMUNITY): Payer: Medicaid Other

## 2013-07-02 DIAGNOSIS — B353 Tinea pedis: Secondary | ICD-10-CM

## 2013-07-02 DIAGNOSIS — R071 Chest pain on breathing: Secondary | ICD-10-CM | POA: Insufficient documentation

## 2013-07-02 DIAGNOSIS — R0789 Other chest pain: Secondary | ICD-10-CM

## 2013-07-02 DIAGNOSIS — J45901 Unspecified asthma with (acute) exacerbation: Secondary | ICD-10-CM | POA: Insufficient documentation

## 2013-07-02 DIAGNOSIS — R209 Unspecified disturbances of skin sensation: Secondary | ICD-10-CM | POA: Insufficient documentation

## 2013-07-02 DIAGNOSIS — M79602 Pain in left arm: Secondary | ICD-10-CM

## 2013-07-02 DIAGNOSIS — M79609 Pain in unspecified limb: Secondary | ICD-10-CM | POA: Insufficient documentation

## 2013-07-02 DIAGNOSIS — IMO0001 Reserved for inherently not codable concepts without codable children: Secondary | ICD-10-CM | POA: Insufficient documentation

## 2013-07-02 LAB — CBC
HCT: 39.2 % (ref 36.0–46.0)
Hemoglobin: 12.8 g/dL (ref 12.0–15.0)
MCH: 28.3 pg (ref 26.0–34.0)
MCHC: 32.7 g/dL (ref 30.0–36.0)
MCV: 86.7 fL (ref 78.0–100.0)
Platelets: 294 K/uL (ref 150–400)
RBC: 4.52 MIL/uL (ref 3.87–5.11)
RDW: 13.4 % (ref 11.5–15.5)
WBC: 7.8 K/uL (ref 4.0–10.5)

## 2013-07-02 LAB — BASIC METABOLIC PANEL
BUN: 14 mg/dL (ref 6–23)
Chloride: 102 mEq/L (ref 96–112)
GFR calc Af Amer: >90 mL/min (ref >90)
GFR calc non Af Amer: >90 mL/min (ref >90)

## 2013-07-02 LAB — POCT I-STAT TROPONIN I: Troponin i, poc: 0.05 ng/mL (ref 0.00–0.08)

## 2013-07-02 LAB — BASIC METABOLIC PANEL WITH GFR
CO2: 26 meq/L (ref 19–32)
Calcium: 9.5 mg/dL (ref 8.4–10.5)
Creatinine, Ser: 0.58 mg/dL (ref 0.50–1.10)
Glucose, Bld: 92 mg/dL (ref 70–99)
Potassium: 4 meq/L (ref 3.7–5.3)
Sodium: 140 meq/L (ref 137–147)

## 2013-07-02 MED ORDER — TERBINAFINE HCL 250 MG PO TABS: 250.0000 mg | ORAL_TABLET | Freq: Every day | ORAL | Status: DC

## 2013-07-02 MED ORDER — OMEPRAZOLE 20 MG PO CPDR: 20.0000 mg | DELAYED_RELEASE_CAPSULE | Freq: Every day | ORAL | Status: DC

## 2013-07-02 MED ORDER — HYDROCODONE-ACETAMINOPHEN 5-325 MG PO TABS
1.0000 | ORAL_TABLET | Freq: Once | ORAL | Status: AC
  Administered 2013-07-02: 1 via ORAL
  Filled 2013-07-02: qty 1

## 2013-07-02 MED ORDER — GI COCKTAIL ~~LOC~~
30.0000 mL | Freq: Once | ORAL | Status: AC
  Administered 2013-07-02: 30 mL via ORAL
  Filled 2013-07-02: qty 30

## 2013-07-02 NOTE — Discharge Instructions (Signed)
Please read and follow all provided instructions.  Your diagnoses today include:  1. Chest wall pain   2. Left arm pain   3. Tinea pedis     Tests performed today include:  An EKG of your heart  A chest x-ray - slightly large heart  Cardiac enzymes - a blood test for heart muscle damage, normal  Blood counts and electrolytes  Vital signs. See below for your results today.   Medications prescribed:   Terbinafine - oral medication for athlete's foot   Omeprazole (Prilosec) - stomach acid reducer  This medication can be found over-the-counter  Take any prescribed medications only as directed.  Follow-up instructions: Please follow-up with your primary care provider as soon as you can for further evaluation of your symptoms. If you do not have a primary care doctor -- see below for referral information.   Return instructions:  SEEK IMMEDIATE MEDICAL ATTENTION IF:  You have severe chest pain, especially if the pain is crushing or pressure-like and spreads to the arms, back, neck, or jaw, or if you have sweating, nausea (feeling sick to your stomach), or shortness of breath. THIS IS AN EMERGENCY. Don't wait to see if the pain will go away. Get medical help at once. Call 911 or 0 (operator). DO NOT drive yourself to the hospital.   Your chest pain gets worse and does not go away with rest.   You have an attack of chest pain lasting longer than usual, despite rest and treatment with the medications your caregiver has prescribed.   You wake from sleep with chest pain or shortness of breath.  You feel dizzy or faint.  You have chest pain not typical of your usual pain for which you originally saw your caregiver.   You have any other emergent concerns regarding your health.  Additional Information: Chest pain comes from many different causes. Your caregiver has diagnosed you as having chest pain that is not specific for one problem, but does not require admission.  You are at  low risk for an acute heart condition or other serious illness.   Your vital signs today were: BP 113/60   Pulse 76   Temp(Src) 98.8 F (37.1 C) (Oral)   Resp 18   Ht 5' (1.524 m)   Wt 160 lb (72.576 kg)   BMI 31.25 kg/m2   SpO2 100%   LMP 06/26/2013 If your blood pressure (BP) was elevated above 135/85 this visit, please have this repeated by your doctor within one month. --------------  Emergency Department Resource Guide 1) Find a Doctor and Pay Out of Pocket Although you won't have to find out who is covered by your insurance plan, it is a good idea to ask around and get recommendations. You will then need to call the office and see if the doctor you have chosen will accept you as a new patient and what types of options they offer for patients who are self-pay. Some doctors offer discounts or will set up payment plans for their patients who do not have insurance, but you will need to ask so you aren't surprised when you get to your appointment.  2) Contact Your Local Health Department Not all health departments have doctors that can see patients for sick visits, but many do, so it is worth a call to see if yours does. If you don't know where your local health department is, you can check in your phone book. The CDC also has a tool to help  you locate your state's health department, and many state websites also have listings of all of their local health departments.  3) Find a Walk-in Clinic If your illness is not likely to be very severe or complicated, you may want to try a walk in clinic. These are popping up all over the country in pharmacies, drugstores, and shopping centers. They're usually staffed by nurse practitioners or physician assistants that have been trained to treat common illnesses and complaints. They're usually fairly quick and inexpensive. However, if you have serious medical issues or chronic medical problems, these are probably not your best option.  No Primary Care  Doctor: - Call Health Connect at  [phone] - they can help you locate a primary care doctor that  accepts your insurance, provides certain services, etc. - Physician Referral Service- [phone]  Chronic Pain Problems: Organization         Address  Phone   Notes  [facility]  [phone] Patients need to be referred by their primary care doctor.   Medication Assistance: Organization         Address  Phone   Notes  [facility] Medication [facility] [address] [place] [place], [place] [address] [phone] --Must be a resident of [facility] -- Must have NO insurance coverage whatsoever (no Medicaid/ Medicare, etc.) -- The pt. MUST have a primary care doctor that directs their care regularly and follows them in the community   [facility]ist  [phone]   [employer]  [phone]    Agencies that provide inexpensive medical care: Organization         Address  Phone   Notes  [facility]  [phone]   [facility]    [phone]   [facility] [address] [place], [place] [address] [phone]   [facility] [address] [place], [place] [phone]   Planned Parenthood    [phone]   [facility]    [phone]   [facility]  [address], [place] Phone:  [phone], Fax:  [phone] Hours of Operation:  9 am - 6 pm, M-F.  Also accepts Medicaid/Medicare and self-pay.  [facility]  [address], [place] Phone: [phone], Fax: [phone]. Hours of Operation:  8:30 am - 5:30 pm, M-F.  Also accepts Medicaid and self-pay.  [facility] [place] [address], [place] Phone: [phone]   [facility] [address] Natasha Bence [place], [place] [phone], Ext. [phone] Mondays & Thursdays: 7-9 AM.  First 15 patients are seen on a first  come, first serve basis.    Medicaid-accepting [facility] Providers:  Organization         Address  Phone   Notes  [facility] [address], [place] [phone] Also accepts self-pay patients.  [facility] [address] Laurell Josephs [place], [place]  [phone]   [facility] [address], [place] [phone]   [facility] [address] [place] [phone]   Renaye Rakers [address], [place]   [phone] Only accepts [place] Access [place] patients after they have their name applied to their card.   Self-Pay (no insurance) in [facility]:  Organization  Address  Phone   Notes  Sickle Cell Patients, [facility] [place] [phone]   [facility] [place] [phone]   [facility] [place]  [place], [place] [phone]   [facility]/Dr. Osei-Bonsu  [address], [place] or [address], [place] [phone] Phone number for both [place] and [place] locations is the same.  Urgent Medical and [facility] [address] [place] [phone]   [facility] ([facility]) [address], [place] or [address] [phone]   [facility] [address] [place] [phone], phone; [phone], fax Sees patients 1st and 3rd Saturday of every month.  Must not qualify for public or private insurance (i.e. Medicaid, Medicare, [place] Health Choice, Veterans' Benefits)  Household income should be no more than 200% of the poverty level The clinic cannot treat you if you are pregnant or think you are pregnant  Sexually transmitted diseases are not treated at the clinic.    Dental Care: Organization          Address  Phone  Notes  [facility] Department of [facility] [place] [phone] Accepts children up to age 57 who are enrolled in [place] or [place]; pregnant women with a Medicaid card; and children who have applied for Medicaid or [place] Health Choice, but were declined, whose parents can pay a reduced fee at time of service.  [facility] Department of [facility]  [address], [place] [phone] Accepts children up to age 67 who are enrolled in [place] or [place]; pregnant women with a Medicaid card; and children who have applied for Medicaid or [place] Health Choice, but were declined, whose parents can pay a reduced fee at time of service.  [facility]  [place] [phone] Patients are seen by appointment only. Walk-ins are not accepted. [place] will see patients 49 years of age and older. Monday - Tuesday (8am-5pm) Most Wednesdays (8:30-5pm) $30 per visit, cash only  [facility] Adult Dental Access PROGRAM  [address], [facility] [phone] Patients are seen by appointment only. Walk-ins are not accepted. [place] will see patients 38 years of age and older. One Wednesday Evening (Monthly: Volunteer Based).  $30 per visit, cash only  [place]  [phone] for adults; Children under age 59, call Graduate Pediatric Dentistry at [phone]. Children aged 77-14, please call [phone] to request a pediatric application.  Dental services are provided in all areas of dental care including fillings, crowns and bridges, complete and partial dentures, implants, gum treatment, root canals, and extractions. Preventive care is also provided. Treatment is provided to both adults and children. Patients are selected via a lottery and there is often a waiting list.   [facility] [address], [place]  [phone] [email or web address]   [facility] [address] [place], [place] [phone], Ext. [phone] Second and Fourth Thursday of each month, opens at 6:30 AM; Clinic ends at 9 AM.  Patients are seen on a first-come first-served basis, and a limited number are seen during each clinic.   [facility]  [address] [place], [place]  [place], [place] [phone]   Eligibility Requirements You must have lived in [place], [place], or [place] counties for at least the last three months.   You cannot be eligible for state or federal sponsored [employer], including [employer], [place], or [employer].   You generally cannot be eligible for healthcare insurance through your employer.    How to apply: Eligibility screenings are held every Tuesday and Wednesday afternoon from 1:00 pm until 4:00 pm. You do not need an appointment for the interview!  [facility] [address], [place], [place]   [place]  [place] Department  [place]  [phone]    Behavioral Health Resources in the Community: Intensive Outpatient Programs Organization         Address  Phone  Notes  [place]. [address], [place], [place] [phone]   [facility] Outpatient [address] [place], [place]   ADS: Alcohol & Drug Svcs [address] [place], [place]   [place] [address]  [place], [place] or [phone]   Substance Abuse Resources Organization         Address  Phone  Notes  Alcohol and Drug Services  [phone]   [place]  [phone]   [facility] [place]   Chinita Pester  [phone]   Residential & Outpatient Substance Abuse Program  [phone]   Psychological  Services Organization         Address  Phone  Notes  [facility] [place]  [place]  [phone]   [place] [address] [place] or [phone]    Mobile Crisis Teams Organization         Address  Phone  Notes  [facility], Mobile Crisis Care Unit  [phone]   [facility]  [address] [place], [place]   [place] [address], [place] [phone]    Self-Help/Support Groups Organization         Address  Phone             Notes  [place]. of [place] - variety of support groups  [place] Call for more information  Narcotics Anonymous (NA), [facility] [address], [employer] [place]  2 meetings at this location   Special educational needs teacher         Address  Phone  Notes  [facility]idential [facility] [place],    [place]  [phone]   [facility]  [address] [place] 846659, [place], [place]   [place], [place] [phone] Admissions: 8am-3pm M-F  Incentives Substance [place] [address],    [place], [place] [phone]   [facility] [address] Jadene Pierini [place], [place]   [facility] [address],  [place], [place]   [facility] - Intensive Outpatient [address] Kristeen [address], [place], [place]   [facility] (Centre.) [place].,  [place], [place] or [phone]   [facility] (RTS) [address], [place], [place] Accepts Medicaid  [facility] [place] [address],  [place] [phone] Substance Abuse/Addiction Treatment   [facility] Resources Organization  Address  Phone  Notes  [facility]  [phone]   Domenic Schwab, PhD [address] Arlis Porta [place], [place]   [phone] or [phone]   [place] [facility] [place], [place] [phone]   [address], [place], [place] [phone] Insurance/Medicaid/sponsorship through [facility] and Families [address] [place]                                    [place], [place] [phone] [place] [address], [place] [phone]    Dr. Adele Schilder  [phone]   [facility] Dept. 1) [address] [place] 2) [place] 3)  [place] [address], [place] [phone]  [phone]   [place] [phone] or [phone] (After Hours)

## 2013-07-02 NOTE — ED Provider Notes (Signed)
CSN: [id number]     Arrival date & time 07/02/13  0845 History   First MD Initiated Contact with Patient 07/02/13 1000     Chief Complaint  Patient presents with  . Chest Pain  . Hand Pain   (Consider location/radiation/quality/duration/timing/severity/associated sxs/prior Treatment) HPI Comments: Patient with history of suspected carpal tunnel of right hand -- presents with complaint of 'pins and needles' in left hand and pain of entire left arm for the past 2 weeks. 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 She denies color change of her arm. She's been taking ibuprofen and Tylenol without relief. She has not seen a doctor for this. Patient also complains of chest pain. Patient awoke from sleep yesterday morning (>24 hrs ago) with complaint of sharp stabbing middle chest pain, worse with deep breathing, with associated shortness of breath and sweating. Patient states that she laid on the floor which helped. Her shortness of breath persisted throughout the day yesterday along with some mild chest discomfort. She denies nausea, vomiting, palpitations. Patient denies risk factors for pulmonary embolism including: unilateral leg swelling, history of DVT/PE/other blood clots, use of estrogens, recent immobilizations, recent surgery, recent travel (>4hr segment), malignancy, hemoptysis. No family history of sudden cardiac death at a young age.   The history is provided by the patient.    Past Medical History  Diagnosis Date  . Asthma    Past Surgical History  Procedure Laterality Date  . No past surgeries    . Dilation and evacuation  05/17/2012    Procedure: DILATATION AND EVACUATION;  Surgeon: Willodean Rosenthalarolyn Harraway-Smith, MD;  Location: [facility];  Service: Gynecology;  Laterality: N/A;   Family History  Problem Relation Age of Onset  . Diabetes Mother   . Hypertension Mother    History  Substance Use Topics  . Smoking status: Never Smoker   . Smokeless tobacco:  Not on file  . Alcohol Use: No   OB History   Grav Para Term Preterm Abortions TAB SAB Ect Mult Living   2 0 0 0 2 0 2 0 0 0      Review of Systems  Constitutional: Negative for fever and diaphoresis.  HENT: Negative for rhinorrhea and sore throat.   Eyes: Negative for redness.  Respiratory: Positive for shortness of breath. Negative for cough.   Cardiovascular: Positive for chest pain. Negative for palpitations and leg swelling.  Gastrointestinal: Negative for nausea, vomiting, abdominal pain and diarrhea.  Genitourinary: Negative for dysuria.  Musculoskeletal: Positive for myalgias. Negative for back pain and neck pain.  Skin: Negative for rash.  Neurological: Positive for numbness (tingling). Negative for syncope, light-headedness and headaches.    Allergies  Review of patient's allergies indicates no known allergies.  Home Medications   Current Outpatient Rx  Name  Route  Sig  Dispense  Refill  . acetaminophen (TYLENOL) 500 MG tablet   Oral   Take 1,000 mg by mouth every 6 (six) hours as needed.         Marland Kitchen. ibuprofen (ADVIL,MOTRIN) 200 MG tablet   Oral   Take 600 mg by mouth every 6 (six) hours as needed for moderate pain.          BP 143/65  Pulse 89  Temp(Src) 98.8 F (37.1 C) (Oral)  Resp 18  Ht 5' (1.524 m)  Wt 160 lb (72.576 kg)  BMI 31.25 kg/m2  SpO2 100%  LMP 06/26/2013  Physical Exam  Nursing note and vitals reviewed. Constitutional:  She appears well-developed and well-nourished.  HENT:  Head: Normocephalic and atraumatic.  Mouth/Throat: Mucous membranes are normal. Mucous membranes are not dry.  Eyes: Conjunctivae are normal.  Neck: Trachea normal and normal range of motion. Neck supple. Normal carotid pulses and no JVD present. No muscular tenderness present. Carotid bruit is not present. No tracheal deviation present.  Cardiovascular: Normal rate, regular rhythm, S1 normal, S2 normal, normal heart sounds and intact distal pulses.  Exam reveals  no decreased pulses.   No murmur heard. No murmurs  Pulmonary/Chest: Effort normal. No respiratory distress. She has no wheezes. She exhibits tenderness (palpation of middle chest wall reproduces pain).  Abdominal: Soft. Normal aorta and bowel sounds are normal. There is no tenderness. There is no rebound, no guarding and negative Murphy's sign.  Musculoskeletal: Normal range of motion. She exhibits no edema.       Left shoulder: She exhibits normal range of motion. Tenderness: generalized.       Left elbow: She exhibits normal range of motion.       Right wrist: Normal.       Left wrist: She exhibits normal range of motion, no bony tenderness and no swelling.       Cervical back: Normal. She exhibits normal range of motion and no tenderness.       Right hand: Normal.       Left hand: She exhibits normal range of motion and no tenderness. Decreased sensation noted. Decreased sensation is present in the ulnar distribution, is present in the medial distribution and is present in the radial distribution. Decreased strength (slight decreased when compared to R ) noted. She exhibits finger abduction and wrist extension trouble.  Neurological: She is alert.  Patient states she has 'pins and needles' sensation in all digits of left hand, dorsal and volar. Sensation normal proximal to wrist.   Skin: Skin is warm and dry. She is not diaphoretic. No cyanosis. No pallor.  Psychiatric: She has a normal mood and affect.    ED Course  Procedures (including critical care time) Labs Review Labs Reviewed  CBC  BASIC METABOLIC PANEL  POCT I-STAT TROPONIN I   Imaging Review Dg Chest 2 View  07/02/2013   CLINICAL DATA:  Chest pain, shortness of breath, history asthma  EXAM: CHEST  2 VIEW  COMPARISON:  03/31/2011  FINDINGS: Enlargement of cardiac silhouette.  Mediastinal contours and pulmonary vascularity normal.  Lungs clear.  No pleural effusion or pneumothorax.  Bones unremarkable.  IMPRESSION:  Enlargement of cardiac silhouette.  No acute abnormalities.   Electronically Signed   By: Ulyses Southward M.D.   On: 07/02/2013 11:08    EKG Interpretation   None      10:01 AM Patient seen and examined. Work-up initiated. Medications ordered.   Vital signs reviewed and are as follows: Filed Vitals:   07/02/13 0856  BP: 143/65  Pulse: 89  Temp: 98.8 F (37.1 C)  Resp: 18    Date: 07/02/2013  Rate: 100  Rhythm: normal sinus rhythm  QRS Axis: left  Intervals: normal  ST/T Wave abnormalities: normal  Conduction Disutrbances:none  Narrative Interpretation: LVH  Old EKG Reviewed: unchanged, new findings of LVH from 2012.   12:39 PM Patient informed of all results, including x-ray findings and cardiac enlargement demonstrated on CXR and EKG. Patient and mother instructed to follow-up with PCP for evaluation of this.    Patient does report some relief with GI cocktail. Will discharge to home with omeprazole.  Patient  was counseled to return with severe chest pain, especially if the pain is crushing or pressure-like and spreads to the arms, back, neck, or jaw, or if they have sweating, nausea, or shortness of breath with the pain. They were encouraged to call 911 with these symptoms.   They were also told to return if their chest pain gets worse and does not go away with rest, they have an attack of chest pain lasting longer than usual despite rest and treatment with the medications their caregiver has prescribed, if they wake from sleep with chest pain or shortness of breath, if they feel dizzy or faint, if they have chest pain not typical of their usual pain, or if they have any other emergent concerns regarding their health.  The patient verbalized understanding and agreed.   Prior to discharge, patient brought up fungal infection of feet. She has been using OTC cream starting with 'L'. She states this is not working. She is encouraged to keep feet dry. Rx oral terbinafine x 2 weeks.    MDM   1. Chest wall pain   2. Left arm pain   3. Tinea pedis    Chest wall pain: Reproducible on exam. Pt PERC neg. EKG shows LVH. CXR clear but enlarged heart. No syncope/near syncope. No murmur on exam. No FH suggestive of HOCM. Trop neg. Patient informed of this finding and asked to f/u with PCP.   Left arm pain/tingling: No LE sx to suggest central cord syndrome. She likely has compression in proximal extremity as all dermatomes affected. She does not have other paresthesia symptoms. Again, PCP/ortho f/u indicated.   Tinea pedis: Per above.   No dangerous or life-threatening conditions suspected or identified by history, physical exam, and by work-up. No indications for hospitalization identified.      Renne Crigler, PA-C 07/02/13 1300

## 2013-07-02 NOTE — ED Notes (Signed)
Patient transported to X-ray 

## 2013-07-02 NOTE — ED Notes (Signed)
Pt c/o center chest pain onset yesterday while sleeping with shortness of breath and sweating. Pt also c/o left hand pain x 2 weeks. Pt has history of carpal tunnel in right hand.

## 2013-07-03 NOTE — ED Provider Notes (Signed)
Medical screening examination/treatment/procedure(s) were performed by non-physician practitioner and as supervising physician I was immediately available for consultation/collaboration.  EKG Interpretation   None         Gavin PoundMichael Y. Layna Roeper, MD 07/03/13 1506

## 2013-07-05 DIAGNOSIS — R202 Paresthesia of skin: Secondary | ICD-10-CM | POA: Insufficient documentation

## 2013-07-05 DIAGNOSIS — R079 Chest pain, unspecified: Secondary | ICD-10-CM | POA: Insufficient documentation

## 2013-07-05 DIAGNOSIS — M791 Myalgia, unspecified site: Secondary | ICD-10-CM | POA: Insufficient documentation

## 2013-07-05 DIAGNOSIS — R0602 Shortness of breath: Secondary | ICD-10-CM | POA: Insufficient documentation

## 2013-07-07 ENCOUNTER — Ambulatory Visit (INDEPENDENT_AMBULATORY_CARE_PROVIDER_SITE_OTHER): Payer: Self-pay | Admitting: Cardiovascular Disease

## 2013-07-07 ENCOUNTER — Encounter: Payer: Self-pay | Admitting: Cardiovascular Disease

## 2013-07-07 VITALS — BP 112/78 | HR 100 | Ht 60.0 in | Wt 204.8 lb

## 2013-07-07 DIAGNOSIS — I517 Cardiomegaly: Secondary | ICD-10-CM | POA: Insufficient documentation

## 2013-07-07 DIAGNOSIS — R0602 Shortness of breath: Secondary | ICD-10-CM

## 2013-07-07 NOTE — Assessment & Plan Note (Signed)
The patient clearly has chest wall pain. She is to continue her current medications including Motrin and Tylenol as needed. She does not need any further cardiac evaluation for this chest wall pain.

## 2013-07-07 NOTE — Patient Instructions (Signed)
Your physician has requested that you have an echocardiogram. Echocardiography is a painless test that uses sound waves to create images of your heart. It provides your doctor with information about the size and shape of your heart and how well your heart's chambers and valves are working. This procedure takes approximately one hour. There are no restrictions for this procedure.  Your physician recommends that you schedule a follow-up appointment in: as needed basis depending on outcome of echo  Your physician recommends that you continue on your current medications as directed. Please refer to the Current Medication list given to you today.

## 2013-07-07 NOTE — Assessment & Plan Note (Signed)
She was noted to have cardiomegaly incidentally on her chest x-ray. We will get an echocardiogram for formal measurement of chamber size and LV function. If the echo is normal, I do not think that she needs any further followup.

## 2013-07-07 NOTE — Assessment & Plan Note (Signed)
She has chronic shortness of breath. I suspect this is because of her obesity. I've encouraged her to start a regular exercise program. She'll followup with her medical doctor.

## 2013-07-07 NOTE — Progress Notes (Signed)
Nicole Brooks Date of Birth  07/25/1985       [facility]    [place] [facility] [address], [address]  [address] [place]  [address]   [place]  [id number] [phone]     [phone]   Fax  [phone]    Fax [phone]  Problem List: 1. " enlarged heart" 2. Left writ injury  History of Present Illness:  Pt recently presented to the emergency room with left wrist pain. She also told the ER doctors that she was having some chest wall discomfort. She had a chest X. it x-ray which revealed borderline cardiomegaly. Her EKG was unremarkable. Her evaluation in the emergency room was normal.  She works at [employer]. She lifts heavy objects throughout much of the day.  She has episodes of sharp chest pain. He seemed to be positional. They seem to be worse with lying down.  The pain seems to be somewhat relieved when she sits forward.   She does not do any aerobic exercise.    She does all of her normal daily activities without difficulties.  She was referred to cardiology for further evaluation of this mild cardiomegaly noted on chest x-ray. Current Outpatient Prescriptions on File Prior to Visit  Medication Sig Dispense Refill  . acetaminophen (TYLENOL) 500 MG tablet Take 1,000 mg by mouth every 6 (six) hours as needed.      Marland Kitchen. ibuprofen (ADVIL,MOTRIN) 200 MG tablet Take 600 mg by mouth every 6 (six) hours as needed for moderate pain.      Marland Kitchen. terbinafine (LAMISIL) 250 MG tablet Take 1 tablet (250 mg total) by mouth daily.  14 tablet  0   No current facility-administered medications on file prior to visit.    No Known Allergies  Past Medical History  Diagnosis Date  . Asthma     Past Surgical History  Procedure Laterality Date  . No past surgeries    . Dilation and evacuation  05/17/2012    Procedure: DILATATION AND EVACUATION;  Surgeon: Willodean Rosenthalarolyn Harraway-Smith, MD;  Location: [facility];  Service: Gynecology;  Laterality:  N/A;  . Dilation and evacuation  05/17/2012    History  Smoking status  . Never Smoker   Smokeless tobacco  . Not on file    History  Alcohol Use No    Family History  Problem Relation Age of Onset  . Diabetes Mother   . Hypertension Mother     Reviw of Systems:  Reviewed in the HPI.  All other systems are negative.  Physical Exam: Blood pressure 112/78, pulse 100, height 5' (1.524 m), weight 204 lb 12.8 oz (92.897 kg), last menstrual period 06/26/2013. Wt Readings from Last 3 Encounters:  07/07/13 204 lb 12.8 oz (92.897 kg)  07/02/13 160 lb (72.576 kg)  06/17/12 177 lb 4.8 oz (80.423 kg)     General: Well developed, well nourished, in no acute distress.  Head: Normocephalic, atraumatic, sclera non-icteric, mucus membranes are moist,   Neck: Supple. Carotids are 2 + without bruits. No JVD   Lungs: Clear   Heart: RR, normal S1, S2  Abdomen: Soft, non-tender, non-distended with normal bowel sounds.  Msk:  Strength and tone are normal   Extremities: No clubbing or cyanosis. No edema.  Distal pedal pulses are 2+ and equal    Neuro: CN II - XII intact.  Alert and oriented X 3.   Psych:  Normal   ECG: Feb.  1, 2015 ( in ER) NSR at 93, normal ECG  Assessment / Plan:

## 2013-07-13 ENCOUNTER — Ambulatory Visit (HOSPITAL_COMMUNITY): Payer: Self-pay | Admitting: Cardiology

## 2013-07-13 NOTE — Progress Notes (Signed)
Encounter in error

## 2013-07-14 ENCOUNTER — Encounter (HOSPITAL_COMMUNITY): Payer: Self-pay | Admitting: Cardiovascular Disease

## 2013-08-05 ENCOUNTER — Telehealth: Payer: Self-pay | Admitting: *Deleted

## 2013-08-05 NOTE — Telephone Encounter (Signed)
Left message/ pt can call back to reschedule echo at her convenience.  Number provided.

## 2013-08-05 NOTE — Telephone Encounter (Signed)
Message copied by Antony OdeaBRILEY, Shelsey Rieth J on Fri Aug 05, 2013  2:38 PM ------      Message from: Mariane MastersMILLER, DEBORAH D      Created: Mon Jul 18, 2013  8:03 AM      Regarding: ECHO       07/18/13 Patient cancel. ------

## 2014-01-02 ENCOUNTER — Ambulatory Visit (HOSPITAL_COMMUNITY): Payer: No Typology Code available for payment source | Attending: Cardiovascular Disease | Admitting: Cardiology

## 2014-01-02 DIAGNOSIS — R579 Shock, unspecified: Secondary | ICD-10-CM

## 2014-01-02 DIAGNOSIS — R0602 Shortness of breath: Secondary | ICD-10-CM

## 2014-01-02 DIAGNOSIS — I517 Cardiomegaly: Secondary | ICD-10-CM | POA: Diagnosis not present

## 2014-01-02 DIAGNOSIS — R0789 Other chest pain: Secondary | ICD-10-CM

## 2014-01-02 NOTE — Progress Notes (Signed)
Echo performed. 

## 2014-01-11 ENCOUNTER — Telehealth: Payer: Self-pay | Admitting: Cardiovascular Disease

## 2014-01-11 NOTE — Telephone Encounter (Signed)
New problem   Pt returning a call from nurse that called her concerning her lab results. Please leave message if she doesn't answer phone.

## 2014-01-11 NOTE — Telephone Encounter (Signed)
Left pt a message that we have no recent  labs records on her since January this year. Pt to call back if she has any questions.

## 2014-01-18 ENCOUNTER — Telehealth: Payer: Self-pay | Admitting: Cardiovascular Disease

## 2014-01-18 NOTE — Telephone Encounter (Signed)
Left message for patient to call back  

## 2014-01-18 NOTE — Telephone Encounter (Signed)
New Message   Pt called to retrieve ECHO results from 01/02/2014. Please called

## 2014-01-18 NOTE — Telephone Encounter (Signed)
ECHO results reviewed with patient who verbalized understanding

## 2014-04-03 ENCOUNTER — Encounter: Payer: Self-pay | Admitting: Cardiovascular Disease

## 2014-06-24 ENCOUNTER — Emergency Department (HOSPITAL_COMMUNITY)
Admission: EM | Admit: 2014-06-24 | Discharge: 2014-06-24 | Disposition: A | Discharge status: Home / Self Care | Payer: No Typology Code available for payment source | Attending: Emergency Medicine | Admitting: Emergency Medicine

## 2014-06-24 ENCOUNTER — Encounter (HOSPITAL_COMMUNITY): Payer: Self-pay

## 2014-06-24 DIAGNOSIS — R519 Headache, unspecified: Secondary | ICD-10-CM

## 2014-06-24 DIAGNOSIS — Z8679 Personal history of other diseases of the circulatory system: Secondary | ICD-10-CM | POA: Insufficient documentation

## 2014-06-24 DIAGNOSIS — J45909 Unspecified asthma, uncomplicated: Secondary | ICD-10-CM | POA: Insufficient documentation

## 2014-06-24 DIAGNOSIS — O99511 Diseases of the respiratory system complicating pregnancy, first trimester: Secondary | ICD-10-CM | POA: Insufficient documentation

## 2014-06-24 DIAGNOSIS — O9989 Other specified diseases and conditions complicating pregnancy, childbirth and the puerperium: Secondary | ICD-10-CM | POA: Insufficient documentation

## 2014-06-24 DIAGNOSIS — R51 Headache: Secondary | ICD-10-CM | POA: Insufficient documentation

## 2014-06-24 DIAGNOSIS — Z3490 Encounter for supervision of normal pregnancy, unspecified, unspecified trimester: Secondary | ICD-10-CM

## 2014-06-24 DIAGNOSIS — Z3A00 Weeks of gestation of pregnancy not specified: Secondary | ICD-10-CM | POA: Insufficient documentation

## 2014-06-24 HISTORY — DX: Cardiomegaly: I51.7

## 2014-06-24 LAB — POC URINE PREG, ED: Preg Test, Ur: POSITIVE — AB

## 2014-06-24 MED ORDER — ACETAMINOPHEN 325 MG PO TABS
325.0000 mg | ORAL_TABLET | Freq: Once | ORAL | Status: AC
  Administered 2014-06-24: 325 mg via ORAL
  Filled 2014-06-24: qty 1

## 2014-06-24 MED ORDER — ONDANSETRON 4 MG PO TBDP: 4.0000 mg | ORAL_TABLET | Freq: Three times a day (TID) | ORAL | Status: DC | PRN

## 2014-06-24 MED ORDER — ACETAMINOPHEN 500 MG PO TABS: 500.0000 mg | ORAL_TABLET | Freq: Four times a day (QID) | ORAL | Status: DC | PRN

## 2014-06-24 NOTE — ED Notes (Signed)
Pt A&OX4, ambulatory at d/c with steady gait, NAD 

## 2014-06-24 NOTE — ED Notes (Signed)
Jen, PA at bedside.

## 2014-06-24 NOTE — ED Provider Notes (Signed)
CSN: [id number]     Arrival date & time 06/24/14  0536 History   First MD Initiated Contact with Patient 06/24/14 [phone]     Chief Complaint  Patient presents with  . Headache  . Possible Pregnancy     (Consider location/radiation/quality/duration/timing/severity/associated sxs/prior Treatment) HPI Comments: Patient is a [id number] 29 yo F presenting to the ED for two complaints. Patient's first complaint is intermittent throbbing frontal headaches over the last month with one episode of emesis today. No other associated symptoms. She has not tried any medications at home PTA. No modifying factors identified. Patient is also complaining that she may be pregnant. She states she has taken five at home pregnancy tests, all of which have been positive. LMP 04/2014. She has not been to the Ob/Gyn regarding this yet. Denies any fevers, chills, nausea, vomiting, abdominal pain, pelvic pain, vaginal bleeding or discharge, urinary symptoms.  Patient is a 29 y.o. female presenting with headaches and pregnancy problem.  Headache Possible Pregnancy Associated symptoms include headaches.    Past Medical History  Diagnosis Date  . Asthma   . Cardiomegaly    Past Surgical History  Procedure Laterality Date  . No past surgeries    . Dilation and evacuation  05/17/2012    Procedure: DILATATION AND EVACUATION;  Surgeon: Willodean Rosenthal, MD;  Location: [facility];  Service: Gynecology;  Laterality: N/A;  . Dilation and evacuation  05/17/2012   Family History  Problem Relation Age of Onset  . Diabetes Mother   . Hypertension Mother    History  Substance Use Topics  . Smoking status: Never Smoker   . Smokeless tobacco: Not on file  . Alcohol Use: No   OB History    Gravida Para Term Preterm AB TAB SAB Ectopic Multiple Living       Review of Systems  Neurological: Positive for headaches.  All other systems reviewed and are negative.     Allergies  Review of  patient's allergies indicates no known allergies.  Home Medications   Prior to Admission medications   Medication Sig Start Date End Date Taking? Authorizing Provider  acetaminophen (TYLENOL) 500 MG tablet Take 1,000 mg by mouth every 6 (six) hours as needed for mild pain.    Yes Historical Provider, MD  ibuprofen (ADVIL,MOTRIN) 200 MG tablet Take 600 mg by mouth every 6 (six) hours as needed for moderate pain.   Yes Historical Provider, MD  omeprazole (PRILOSEC) 20 MG capsule Take 20 mg by mouth daily as needed (acid reflux).  07/02/13  Yes Renne Crigler, PA-C  terbinafine (LAMISIL) 250 MG tablet Take 1 tablet (250 mg total) by mouth daily. Patient taking differently: Take 250 mg by mouth daily as needed (fungus).  07/02/13  Yes Renne Crigler, PA-C  acetaminophen (TYLENOL) 500 MG tablet Take 1 tablet (500 mg total) by mouth every 6 (six) hours as needed. 06/24/14   Tinley Rought L Veleria Barnhardt, PA-C  ondansetron (ZOFRAN ODT) 4 MG disintegrating tablet Take 1 tablet (4 mg total) by mouth every 8 (eight) hours as needed for nausea or vomiting. 06/24/14   Victorino Dike L Chanceler Pullin, PA-C   BP 106/41 mmHg  Pulse 76  Temp(Src) 98.8 F (37.1 C) (Oral)  Resp 16  Ht  (1.499 m)  Wt 175 lb (79.379 kg)  BMI 35.33 kg/m2  SpO2 100%  LMP 04/24/2014 Physical Exam  Constitutional: She is oriented to person, place, and time. She appears well-developed and well-nourished.  No distress.  HENT:  Head: Normocephalic and atraumatic.  Right Ear: External ear normal.  Left Ear: External ear normal.  Nose: Nose normal.  Mouth/Throat: Oropharynx is clear and moist. No oropharyngeal exudate.  Eyes: Conjunctivae and EOM are normal. Pupils are equal, round, and reactive to light.  Neck: Normal range of motion. Neck supple.  Cardiovascular: Normal rate, regular rhythm, normal heart sounds and intact distal pulses.   Pulmonary/Chest: Effort normal and breath sounds normal. No respiratory distress.  Abdominal: Soft.  There is no tenderness.  Neurological: She is alert and oriented to person, place, and time. She has normal strength. No cranial nerve deficit. Gait normal. GCS eye subscore is 4. GCS verbal subscore is 5. GCS motor subscore is 6.  Sensation grossly intact.  No pronator drift.  Bilateral heel-knee-shin intact.  Skin: Skin is warm and dry. She is not diaphoretic.  Nursing note and vitals reviewed.   ED Course  Procedures (including critical care time) Medications  acetaminophen (TYLENOL) tablet 325 mg (325 mg Oral Given 06/24/14 0603)    Labs Review Labs Reviewed  POC URINE PREG, ED - Abnormal; Notable for the following:    Preg Test, Ur POSITIVE (*)    All other components within normal limits    Imaging Review No results found.   EKG Interpretation None      MDM   Final diagnoses:  Bad headache  Pregnancy    Filed Vitals:   06/24/14 0600  BP: 106/41  Pulse: 76  Temp:   Resp:    Afebrile, NAD, non-toxic appearing, AAOx4.   1) HA: Pt HA treated and improved while in ED.  Presentation is like pts typical HA and non concerning for [facility], ICH, Meningitis, or temporal arteritis. Pt is afebrile with no focal neuro deficits, nuchal rigidity, or change in vision. Pt is to follow up with PCP to discuss prophylactic medication. Pt verbalizes understanding and is agreeable with plan to dc.   2) Pregnancy:  Abdomen soft, non-tender, non-distended. No abdominal or pelvic complaints. Advised OB/GYN follow up.  Return precautions discussed. Patient is agreeable to plan. Patient is stable at time of discharge     Jeannetta EllisJennifer L Annalea Alguire, PA-C 06/24/14 1656  Dione Boozeavid Glick, MD 06/27/14 2300

## 2014-06-24 NOTE — Discharge Instructions (Signed)
Please follow up with your primary care physician in 1-2 days. If you do not have one please call the [facility] and wellness Center number listed above. Please follow up with the ob/gyn to schedule a follow up appointment.  Please read all discharge instructions and return precautions.   General Headache Without Cause A headache is pain or discomfort felt around the head or neck area. The specific cause of a headache may not be found. There are many causes and types of headaches. A few common ones are:  Tension headaches.  Migraine headaches.  Cluster headaches.  Chronic daily headaches. HOME CARE INSTRUCTIONS   Keep all follow-up appointments with your caregiver or any specialist referral.  Only take over-the-counter or prescription medicines for pain or discomfort as directed by your caregiver.  Lie down in a dark, quiet room when you have a headache.  Keep a headache journal to find out what may trigger your migraine headaches. For example, write down:  What you eat and drink.  How much sleep you get.  Any change to your diet or medicines.  Try massage or other relaxation techniques.  Put ice packs or heat on the head and neck. Use these 3 to 4 times per day for 15 to 20 minutes each time, or as needed.  Limit stress.  Sit up straight, and do not tense your muscles.  Quit smoking if you smoke.  Limit alcohol use.  Decrease the amount of caffeine you drink, or stop drinking caffeine.  Eat and sleep on a regular schedule.  Get 7 to 9 hours of sleep, or as recommended by your caregiver.  Keep lights dim if bright lights bother you and make your headaches worse. SEEK MEDICAL CARE IF:   You have problems with the medicines you were prescribed.  Your medicines are not working.  You have a change from the usual headache.  You have nausea or vomiting. SEEK IMMEDIATE MEDICAL CARE IF:   Your headache becomes severe.  You have a fever.  You have a stiff  neck.  You have loss of vision.  You have muscular weakness or loss of muscle control.  You start losing your balance or have trouble walking.  You feel faint or pass out.  You have severe symptoms that are different from your first symptoms. MAKE SURE YOU:   Understand these instructions.  Will watch your condition.  Will get help right away if you are not doing well or get worse. Document Released: 05/19/2005 Document Revised: 08/11/2011 Document Reviewed: 06/04/2011 [facility] Patient Information 2015 [place], [place]. This information is not intended to replace advice given to you by your health care provider. Make sure you discuss any questions you have with your health care provider. First Trimester of Pregnancy The first trimester of pregnancy is from week 1 until the end of week 12 (months 1 through 3). A week after a sperm fertilizes an egg, the egg will implant on the wall of the uterus. This embryo will begin to develop into a baby. Genes from you and your partner are forming the baby. The female genes determine whether the baby is a boy or a girl. At 6-8 weeks, the eyes and face are formed, and the heartbeat can be seen on ultrasound. At the end of 12 weeks, all the baby's organs are formed.  Now that you are pregnant, you will want to do everything you can to have a healthy baby. Two of the most important things are to get  good prenatal care and to follow your health care provider's instructions. Prenatal care is all the medical care you receive before the baby's birth. This care will help prevent, find, and treat any problems during the pregnancy and childbirth. BODY CHANGES Your body goes through many changes during pregnancy. The changes vary from woman to woman.   You may gain or lose a couple of pounds at first.  You may feel sick to your stomach (nauseous) and throw up (vomit). If the vomiting is uncontrollable, call your health care provider.  You may tire easily.  You  may develop headaches that can be relieved by medicines approved by your health care provider.  You may urinate more often. Painful urination may mean you have a bladder infection.  You may develop heartburn as a result of your pregnancy.  You may develop constipation because certain hormones are causing the muscles that push waste through your intestines to slow down.  You may develop hemorrhoids or swollen, bulging veins (varicose veins).  Your breasts may begin to grow larger and become tender. Your nipples may stick out more, and the tissue that surrounds them (areola) may become darker.  Your gums may bleed and may be sensitive to brushing and flossing.  Dark spots or blotches (chloasma, mask of pregnancy) may develop on your face. This will likely fade after the baby is born.  Your menstrual periods will stop.  You may have a loss of appetite.  You may develop cravings for certain kinds of food.  You may have changes in your emotions from day to day, such as being excited to be pregnant or being concerned that something may go wrong with the pregnancy and baby.  You may have more vivid and strange dreams.  You may have changes in your hair. These can include thickening of your hair, rapid growth, and changes in texture. Some women also have hair loss during or after pregnancy, or hair that feels dry or thin. Your hair will most likely return to normal after your baby is born. WHAT TO EXPECT AT YOUR PRENATAL VISITS During a routine prenatal visit:  You will be weighed to make sure you and the baby are growing normally.  Your blood pressure will be taken.  Your abdomen will be measured to track your baby's growth.  The fetal heartbeat will be listened to starting around week 10 or 12 of your pregnancy.  Test results from any previous visits will be discussed. Your health care provider may ask you:  How you are feeling.  If you are feeling the baby move.  If you have  had any abnormal symptoms, such as leaking fluid, bleeding, severe headaches, or abdominal cramping.  If you have any questions. Other tests that may be performed during your first trimester include:  Blood tests to find your blood type and to check for the presence of any previous infections. They will also be used to check for low iron levels (anemia) and Rh antibodies. Later in the pregnancy, blood tests for diabetes will be done along with other tests if problems develop.  Urine tests to check for infections, diabetes, or protein in the urine.  An ultrasound to confirm the proper growth and development of the baby.  An amniocentesis to check for possible genetic problems.  Fetal screens for spina bifida and Down syndrome.  You may need other tests to make sure you and the baby are doing well. HOME CARE INSTRUCTIONS  Medicines  Follow your health  care provider's instructions regarding medicine use. Specific medicines may be either safe or unsafe to take during pregnancy.  Take your prenatal vitamins as directed.  If you develop constipation, try taking a stool softener if your health care provider approves. Diet  Eat regular, well-balanced meals. Choose a variety of foods, such as meat or vegetable-based protein, fish, milk and low-fat dairy products, vegetables, fruits, and whole grain breads and cereals. Your health care provider will help you determine the amount of weight gain that is right for you.  Avoid raw meat and uncooked cheese. These carry germs that can cause birth defects in the baby.  Eating four or five small meals rather than three large meals a day may help relieve nausea and vomiting. If you start to feel nauseous, eating a few soda crackers can be helpful. Drinking liquids between meals instead of during meals also seems to help nausea and vomiting.  If you develop constipation, eat more high-fiber foods, such as fresh vegetables or fruit and whole grains. Drink  enough fluids to keep your urine clear or pale yellow. Activity and Exercise  Exercise only as directed by your health care provider. Exercising will help you:  Control your weight.  Stay in shape.  Be prepared for labor and delivery.  Experiencing pain or cramping in the lower abdomen or low back is a good sign that you should stop exercising. Check with your health care provider before continuing normal exercises.  Try to avoid standing for long periods of time. Move your legs often if you must stand in one place for a long time.  Avoid heavy lifting.  Wear low-heeled shoes, and practice good posture.  You may continue to have sex unless your health care provider directs you otherwise. Relief of Pain or Discomfort  Wear a good support bra for breast tenderness.   Take warm sitz baths to soothe any pain or discomfort caused by hemorrhoids. Use hemorrhoid cream if your health care provider approves.   Rest with your legs elevated if you have leg cramps or low back pain.  If you develop varicose veins in your legs, wear support hose. Elevate your feet for 15 minutes, 3-4 times a day. Limit salt in your diet. Prenatal Care  Schedule your prenatal visits by the twelfth week of pregnancy. They are usually scheduled monthly at first, then more often in the last 2 months before delivery.  Write down your questions. Take them to your prenatal visits.  Keep all your prenatal visits as directed by your health care provider. Safety  Wear your seat belt at all times when driving.  Make a list of emergency phone numbers, including numbers for family, friends, the hospital, and police and fire departments. General Tips  Ask your health care provider for a referral to a local prenatal education class. Begin classes no later than at the beginning of month 6 of your pregnancy.  Ask for help if you have counseling or nutritional needs during pregnancy. Your health care provider can  offer advice or refer you to specialists for help with various needs.  Do not use hot tubs, steam rooms, or saunas.  Do not douche or use tampons or scented sanitary pads.  Do not cross your legs for long periods of time.  Avoid cat litter boxes and soil used by cats. These carry germs that can cause birth defects in the baby and possibly loss of the fetus by miscarriage or stillbirth.  Avoid all smoking, herbs, alcohol, and  medicines not prescribed by your health care provider. Chemicals in these affect the formation and growth of the baby.  Schedule a dentist appointment. At home, brush your teeth with a soft toothbrush and be gentle when you floss. SEEK MEDICAL CARE IF:   You have dizziness.  You have mild pelvic cramps, pelvic pressure, or nagging pain in the abdominal area.  You have persistent nausea, vomiting, or diarrhea.  You have a bad smelling vaginal discharge.  You have pain with urination.  You notice increased swelling in your face, hands, legs, or ankles. SEEK IMMEDIATE MEDICAL CARE IF:   You have a fever.  You are leaking fluid from your vagina.  You have spotting or bleeding from your vagina.  You have severe abdominal cramping or pain.  You have rapid weight gain or loss.  You vomit blood or material that looks like coffee grounds.  You are exposed to [place] measles and have never had them.  You are exposed to fifth disease or chickenpox.  You develop a severe headache.  You have shortness of breath.  You have any kind of trauma, such as from a fall or a car accident. Document Released: 05/13/2001 Document Revised: 10/03/2013 Document Reviewed: 03/29/2013 [facility] Patient Information 2015 [place]. This information is not intended to replace advice given to you by your health care provider. Make sure you discuss any questions you have with your health care provider.

## 2014-06-24 NOTE — ED Notes (Signed)
Pt reports she has been having headaches for over a month now and states the HA "felt like it was just getting worse today." No other symptoms or complaints but pt also states she took 5 home pregnancy tests and they all came back positive and so she would like another pregnancy test today. LMP-04/2014

## 2014-06-26 ENCOUNTER — Telehealth: Payer: Self-pay | Admitting: *Deleted

## 2014-06-26 DIAGNOSIS — O3680X1 Pregnancy with inconclusive fetal viability, fetus 1: Secondary | ICD-10-CM

## 2014-06-26 NOTE — Telephone Encounter (Signed)
Pt called stating she is pregnant and [facility]  Ultrasound scheduled for 06/29/14 @ 0800.  Contacted patient, ultrasound appointment given.  Pt verbalizes understanding.

## 2014-06-29 ENCOUNTER — Ambulatory Visit (HOSPITAL_COMMUNITY)
Admission: RE | Admit: 2014-06-29 | Discharge: 2014-06-29 | Disposition: A | Discharge status: Home / Self Care | Payer: Self-pay | Source: Ambulatory Visit | Attending: Obstetrics & Gynecology | Admitting: Obstetrics & Gynecology

## 2014-06-29 DIAGNOSIS — Z36 Encounter for antenatal screening of mother: Secondary | ICD-10-CM | POA: Insufficient documentation

## 2014-06-29 DIAGNOSIS — O208 Other hemorrhage in early pregnancy: Secondary | ICD-10-CM | POA: Insufficient documentation

## 2014-06-29 DIAGNOSIS — O3481 Maternal care for other abnormalities of pelvic organs, first trimester: Secondary | ICD-10-CM | POA: Insufficient documentation

## 2014-06-29 DIAGNOSIS — O3680X1 Pregnancy with inconclusive fetal viability, fetus 1: Secondary | ICD-10-CM

## 2014-06-29 DIAGNOSIS — Z3A01 Less than 8 weeks gestation of pregnancy: Secondary | ICD-10-CM | POA: Insufficient documentation

## 2014-06-29 DIAGNOSIS — N831 Corpus luteum cyst: Secondary | ICD-10-CM | POA: Insufficient documentation

## 2014-07-04 ENCOUNTER — Telehealth: Payer: Self-pay | Admitting: *Deleted

## 2014-07-04 NOTE — Telephone Encounter (Signed)
Attempted to contact patient to give ultrasound results and to she if she is going to start her prenatal care at the clinic, no answer, left message for patient to contact the clinic for results.

## 2014-07-04 NOTE — Telephone Encounter (Signed)
Pt returned the call to the front desk and call was transferred to me I informed pt that her US results showed a living pregnancy and that her due date is 02/15/15.  Pt asked if she could get prenatal care

## 2014-07-05 NOTE — Telephone Encounter (Signed)
Pt is scheduled for 08/01/14.

## 2014-08-01 ENCOUNTER — Telehealth: Payer: Self-pay

## 2014-08-01 ENCOUNTER — Other Ambulatory Visit: Payer: Self-pay | Admitting: Physician Assistant

## 2014-08-01 ENCOUNTER — Encounter: Payer: Self-pay | Admitting: Physician Assistant

## 2014-08-01 ENCOUNTER — Ambulatory Visit (INDEPENDENT_AMBULATORY_CARE_PROVIDER_SITE_OTHER): Payer: 59 | Admitting: Physician Assistant

## 2014-08-01 VITALS — BP 125/68 | HR 92 | Temp 98.0°F | Ht 60.0 in | Wt 192.0 lb

## 2014-08-01 DIAGNOSIS — Z3481 Encounter for supervision of other normal pregnancy, first trimester: Secondary | ICD-10-CM

## 2014-08-01 DIAGNOSIS — Z113 Encounter for screening for infections with a predominantly sexual mode of transmission: Secondary | ICD-10-CM | POA: Diagnosis not present

## 2014-08-01 DIAGNOSIS — Z118 Encounter for screening for other infectious and parasitic diseases: Secondary | ICD-10-CM | POA: Diagnosis not present

## 2014-08-01 DIAGNOSIS — Z124 Encounter for screening for malignant neoplasm of cervix: Secondary | ICD-10-CM | POA: Diagnosis not present

## 2014-08-01 DIAGNOSIS — Z3491 Encounter for supervision of normal pregnancy, unspecified, first trimester: Secondary | ICD-10-CM | POA: Insufficient documentation

## 2014-08-01 DIAGNOSIS — Z3682 Encounter for antenatal screening for nuchal translucency: Secondary | ICD-10-CM

## 2014-08-01 LAB — POCT URINALYSIS DIP (DEVICE)
Bilirubin Urine: NEGATIVE
Glucose, UA: NEGATIVE mg/dL
Hgb urine dipstick: NEGATIVE
Ketones, ur: NEGATIVE mg/dL
Leukocytes, UA: NEGATIVE
Nitrite: NEGATIVE
Protein, ur: NEGATIVE mg/dL
SPECIFIC GRAVITY, URINE: 1.015 (ref 1.005–1.030)
UROBILINOGEN UA: 0.2 mg/dL (ref 0.0–1.0)
pH: 7 (ref 5.0–8.0)

## 2014-08-01 LAB — OB RESULTS CONSOLE GC/CHLAMYDIA
Chlamydia: NEGATIVE
Gonorrhea: NEGATIVE

## 2014-08-01 NOTE — Progress Notes (Signed)
Initial visit today.  Initial labs and early glucola given family history and BMI.  New OB packet given.

## 2014-08-01 NOTE — Patient Instructions (Signed)
First Trimester of Pregnancy The first trimester of pregnancy is from week 1 until the end of week 12 (months 1 through 3). A week after a sperm fertilizes an egg, the egg will implant on the wall of the uterus. This embryo will begin to develop into a baby. Genes from you and your partner are forming the baby. The female genes determine whether the baby is a boy or a girl. At 6-8 weeks, the eyes and face are formed, and the heartbeat can be seen on ultrasound. At the end of 12 weeks, all the baby's organs are formed.  Now that you are pregnant, you will want to do everything you can to have a healthy baby. Two of the most important things are to get good prenatal care and to follow your health care provider's instructions. Prenatal care is all the medical care you receive before the baby's birth. This care will help prevent, find, and treat any problems during the pregnancy and childbirth. BODY CHANGES Your body goes through many changes during pregnancy. The changes vary from woman to woman.   You may gain or lose a couple of pounds at first.  You may feel sick to your stomach (nauseous) and throw up (vomit). If the vomiting is uncontrollable, call your health care provider.  You may tire easily.  You may develop headaches that can be relieved by medicines approved by your health care provider.  You may urinate more often. Painful urination may mean you have a bladder infection.  You may develop heartburn as a result of your pregnancy.  You may develop constipation because certain hormones are causing the muscles that push waste through your intestines to slow down.  You may develop hemorrhoids or swollen, bulging veins (varicose veins).  Your breasts may begin to grow larger and become tender. Your nipples may stick out more, and the tissue that surrounds them (areola) may become darker.  Your gums may bleed and may be sensitive to brushing and flossing.  Dark spots or blotches (chloasma,  mask of pregnancy) may develop on your face. This will likely fade after the baby is born.  Your menstrual periods will stop.  You may have a loss of appetite.  You may develop cravings for certain kinds of food.  You may have changes in your emotions from day to day, such as being excited to be pregnant or being concerned that something may go wrong with the pregnancy and baby.  You may have more vivid and strange dreams.  You may have changes in your hair. These can include thickening of your hair, rapid growth, and changes in texture. Some women also have hair loss during or after pregnancy, or hair that feels dry or thin. Your hair will most likely return to normal after your baby is born. WHAT TO EXPECT AT YOUR PRENATAL VISITS During a routine prenatal visit:  You will be weighed to make sure you and the baby are growing normally.  Your blood pressure will be taken.  Your abdomen will be measured to track your baby's growth.  The fetal heartbeat will be listened to starting around week 10 or 12 of your pregnancy.  Test results from any previous visits will be discussed. Your health care provider may ask you:  How you are feeling.  If you are feeling the baby move.  If you have had any abnormal symptoms, such as leaking fluid, bleeding, severe headaches, or abdominal cramping.  If you have any questions. Other tests   that may be performed during your first trimester include:  Blood tests to find your blood type and to check for the presence of any previous infections. They will also be used to check for low iron levels (anemia) and Rh antibodies. Later in the pregnancy, blood tests for diabetes will be done along with other tests if problems develop.  Urine tests to check for infections, diabetes, or protein in the urine.  An ultrasound to confirm the proper growth and development of the baby.  An amniocentesis to check for possible genetic problems.  Fetal screens for  spina bifida and Down syndrome.  You may need other tests to make sure you and the baby are doing well. HOME CARE INSTRUCTIONS  Medicines  Follow your health care provider's instructions regarding medicine use. Specific medicines may be either safe or unsafe to take during pregnancy.  Take your prenatal vitamins as directed.  If you develop constipation, try taking a stool softener if your health care provider approves. Diet  Eat regular, well-balanced meals. Choose a variety of foods, such as meat or vegetable-based protein, fish, milk and low-fat dairy products, vegetables, fruits, and whole grain breads and cereals. Your health care provider will help you determine the amount of weight gain that is right for you.  Avoid raw meat and uncooked cheese. These carry germs that can cause birth defects in the baby.  Eating four or five small meals rather than three large meals a day may help relieve nausea and vomiting. If you start to feel nauseous, eating a few soda crackers can be helpful. Drinking liquids between meals instead of during meals also seems to help nausea and vomiting.  If you develop constipation, eat more high-fiber foods, such as fresh vegetables or fruit and whole grains. Drink enough fluids to keep your urine clear or pale yellow. Activity and Exercise  Exercise only as directed by your health care provider. Exercising will help you:  Control your weight.  Stay in shape.  Be prepared for labor and delivery.  Experiencing pain or cramping in the lower abdomen or low back is a good sign that you should stop exercising. Check with your health care provider before continuing normal exercises.  Try to avoid standing for long periods of time. Move your legs often if you must stand in one place for a long time.  Avoid heavy lifting.  Wear low-heeled shoes, and practice good posture.  You may continue to have sex unless your health care provider directs you  otherwise. Relief of Pain or Discomfort  Wear a good support bra for breast tenderness.   Take warm sitz baths to soothe any pain or discomfort caused by hemorrhoids. Use hemorrhoid cream if your health care provider approves.   Rest with your legs elevated if you have leg cramps or low back pain.  If you develop varicose veins in your legs, wear support hose. Elevate your feet for 15 minutes, 3-4 times a day. Limit salt in your diet. Prenatal Care  Schedule your prenatal visits by the twelfth week of pregnancy. They are usually scheduled monthly at first, then more often in the last 2 months before delivery.  Write down your questions. Take them to your prenatal visits.  Keep all your prenatal visits as directed by your health care provider. Safety  Wear your seat belt at all times when driving.  Make a list of emergency phone numbers, including numbers for family, friends, the hospital, and police and fire departments. General Tips    Ask your health care provider for a referral to a local prenatal education class. Begin classes no later than at the beginning of month 6 of your pregnancy.  Ask for help if you have counseling or nutritional needs during pregnancy. Your health care provider can offer advice or refer you to specialists for help with various needs.  Do not use hot tubs, steam rooms, or saunas.  Do not douche or use tampons or scented sanitary pads.  Do not cross your legs for long periods of time.  Avoid cat litter boxes and soil used by cats. These carry germs that can cause birth defects in the baby and possibly loss of the fetus by miscarriage or stillbirth.  Avoid all smoking, herbs, alcohol, and medicines not prescribed by your health care provider. Chemicals in these affect the formation and growth of the baby.  Schedule a dentist appointment. At home, brush your teeth with a soft toothbrush and be gentle when you floss. SEEK MEDICAL CARE IF:   You have  dizziness.  You have mild pelvic cramps, pelvic pressure, or nagging pain in the abdominal area.  You have persistent nausea, vomiting, or diarrhea.  You have a bad smelling vaginal discharge.  You have pain with urination.  You notice increased swelling in your face, hands, legs, or ankles. SEEK IMMEDIATE MEDICAL CARE IF:   You have a fever.  You are leaking fluid from your vagina.  You have spotting or bleeding from your vagina.  You have severe abdominal cramping or pain.  You have rapid weight gain or loss.  You vomit blood or material that looks like coffee grounds.  You are exposed to German measles and have never had them.  You are exposed to fifth disease or chickenpox.  You develop a severe headache.  You have shortness of breath.  You have any kind of trauma, such as from a fall or a car accident. Document Released: 05/13/2001 Document Revised: 10/03/2013 Document Reviewed: 03/29/2013 [employer] Patient Information 2015 [employer]. This information is not intended to replace advice given to you by your health care provider. Make sure you discuss any questions you have with your health care provider.  

## 2014-08-01 NOTE — Progress Notes (Signed)
  Subjective:    Nicole Brooks is a G2P0010 [id number] being seen today for her first obstetrical visit.  Patient does not intend to breast feed. Pregnancy history fully reviewed.  Patient reports no complaints.  Filed Vitals:   08/01/14 0847 08/01/14 0850  BP: 125/68   Pulse: 92   Temp: 98 F (36.7 C)   Height:  5' (1.524 m)  Weight: 192 lb (87.091 kg)     HISTORY: OB History  Gravida Para Term Preterm AB SAB TAB Ectopic Multiple Living  2 0 0 0 1 1 0 0 0 0     # Outcome Date GA Lbr Len/2nd Weight Sex Delivery Anes PTL Lv  2 Current           1 SAB 2013             Past Medical History  Diagnosis Date  . Asthma   . Cardiomegaly    Past Surgical History  Procedure Laterality Date  . No past surgeries    . Dilation and evacuation  05/17/2012    Procedure: DILATATION AND EVACUATION;  Surgeon: Willodean Rosenthalarolyn Harraway-Smith, MD;  Location: [facility];  Service: Gynecology;  Laterality: N/A;  . Dilation and evacuation  05/17/2012   Family History  Problem Relation Age of Onset  . Diabetes Mother   . Hypertension Mother      Exam    Uterus:   gravid  Pelvic Exam:    Perineum: No Hemorrhoids, Normal Perineum   Vulva: normal   Vagina:  normal mucosa, normal discharge   pH:    Cervix: no bleeding following Pap and no cervical motion tenderness   Adnexa: normal adnexa   Bony Pelvis: average  System: Breast:  normal appearance, no masses or tenderness   Skin: normal coloration and turgor, no rashes    Neurologic: normal mood   Extremities: normal strength, tone, and muscle mass   HEENT extra ocular movement intact   Mouth/Teeth mucous membranes moist, pharynx normal without lesions and dental hygiene good   Neck supple   Cardiovascular: regular rate and rhythm   Respiratory:  appears well, vitals normal, no respiratory distress, acyanotic, normal RR, ear and throat exam is normal, neck free of mass or lymphadenopathy, chest clear, no wheezing, crepitations, rhonchi, normal  symmetric air entry   Abdomen: soft, non-tender; bowel sounds normal; no masses,  no organomegaly   Urinary: urethral meatus normal      Assessment:    Pregnancy: G2P0010 Patient Active Problem List   Diagnosis Date Noted  . Supervision of normal pregnancy in first trimester 08/01/2014  . SOB (shortness of breath) 07/05/2013  . Chest pain 07/05/2013  . Myalgia 07/05/2013  . Tingling 07/05/2013  . Migraine 05/12/2012  . Tinea pedis 05/12/2012       Plan:    Initial labs drawn. Prenatal vitamins. Problem list reviewed and updated. Genetic Screening discussed First Screen: requested.  Ultrasound discussed; fetal survey: requested.  Follow up in 4 weeks. 90% of 45 min visit spent on counseling and coordination of care.  No treatment requested for HA at this time as HAs are improved.  Pt may use Tylenol if needed.  Call office if ineffective.    Bertram Denvereague Clark, Karen E 08/01/2014

## 2014-08-01 NOTE — Telephone Encounter (Signed)
Patient left clinic before first trimester screen scheduled. Appointment made for Thursday 08/10/14 at 3:15PM. Called patient and informed her of appointment day and time-- patient states she may not be able to make this. Advised that she try her best because it was the only appointment available. Informed her if she cannot make this appointment we will do a quad screen later in pregnancy. MFM number given to patient to call if she needs to cancel. Patient verbalized understanding and gratitude. No further questions or concerns.

## 2014-08-02 LAB — PRESCRIPTION MONITORING PROFILE (19 PANEL)
AMPHETAMINE/METH: NEGATIVE ng/mL
BARBITURATE SCREEN, URINE: NEGATIVE ng/mL
BUPRENORPHINE, URINE: NEGATIVE ng/mL
Benzodiazepine Screen, Urine: NEGATIVE ng/mL
CANNABINOID SCRN UR: NEGATIVE ng/mL
CARISOPRODOL, URINE: NEGATIVE ng/mL
Cocaine Metabolites: NEGATIVE ng/mL
Creatinine, Urine: 140.87 mg/dL (ref >20.0)
Fentanyl, Ur: NEGATIVE ng/mL
MDMA URINE: NEGATIVE ng/mL
Meperidine, Ur: NEGATIVE ng/mL
Methadone Screen, Urine: NEGATIVE ng/mL
Methaqualone: NEGATIVE ng/mL
NITRITES URINE, INITIAL: NEGATIVE ug/mL
OPIATE SCREEN, URINE: NEGATIVE ng/mL
Oxycodone Screen, Ur: NEGATIVE ng/mL
PHENCYCLIDINE, UR: NEGATIVE ng/mL
PROPOXYPHENE: NEGATIVE ng/mL
TAPENTADOLUR: NEGATIVE ng/mL
Tramadol Scrn, Ur: NEGATIVE ng/mL
Zolpidem, Urine: NEGATIVE ng/mL
pH, Initial: 7.3 pH (ref 4.5–8.9)

## 2014-08-02 LAB — PRENATAL PROFILE (SOLSTAS)
Antibody Screen: NEGATIVE
BASOS PCT: 0 % (ref 0–1)
Basophils Absolute: 0 10*3/uL (ref 0.0–0.1)
EOS ABS: 0.1 10*3/uL (ref 0.0–0.7)
Eosinophils Relative: 1 % (ref 0–5)
HCT: 36.3 % (ref 36.0–46.0)
HIV 1&2 Ab, 4th Generation: NONREACTIVE
Hemoglobin: 11.7 g/dL — ABNORMAL LOW (ref 12.0–15.0)
Hepatitis B Surface Ag: NEGATIVE
Lymphocytes Relative: 17 % (ref 12–46)
Lymphs Abs: 1.2 10*3/uL (ref 0.7–4.0)
MCH: 27.8 pg (ref 26.0–34.0)
MCHC: 32.2 g/dL (ref 30.0–36.0)
MCV: 86.2 fL (ref 78.0–100.0)
MPV: 9.1 fL (ref 8.6–12.4)
Monocytes Absolute: 0.5 10*3/uL (ref 0.1–1.0)
Monocytes Relative: 7 % (ref 3–12)
NEUTROS ABS: 5.4 10*3/uL (ref 1.7–7.7)
Neutrophils Relative %: 75 % (ref 43–77)
PLATELETS: 302 10*3/uL (ref 150–400)
RBC: 4.21 MIL/uL (ref 3.87–5.11)
RDW: 13.2 % (ref 11.5–15.5)
RH TYPE: POSITIVE
RUBELLA: 0.56 {index} (ref <0.90)
WBC: 7.2 10*3/uL (ref 4.0–10.5)

## 2014-08-02 LAB — CYTOLOGY - PAP

## 2014-08-02 LAB — GLUCOSE TOLERANCE, 1 HOUR (50G) W/O FASTING: Glucose, 1 Hour GTT: 112 mg/dL (ref 70–140)

## 2014-08-03 LAB — HEMOGLOBINOPATHY EVALUATION
HGB A2 QUANT: 2.6 % (ref 2.2–3.2)
HGB A: 97 % (ref 96.8–97.8)
Hemoglobin Other: 0 %
Hgb F Quant: 0.4 % (ref 0.0–2.0)
Hgb S Quant: 0 %

## 2014-08-03 LAB — CULTURE, OB URINE
Colony Count: NO GROWTH
ORGANISM ID, BACTERIA: NO GROWTH

## 2014-08-10 ENCOUNTER — Ambulatory Visit (HOSPITAL_COMMUNITY): Admission: RE | Admit: 2014-08-10 | Payer: 59 | Source: Ambulatory Visit

## 2014-08-10 ENCOUNTER — Ambulatory Visit (HOSPITAL_COMMUNITY)
Admission: RE | Admit: 2014-08-10 | Discharge: 2014-08-10 | Disposition: A | Discharge status: Home / Self Care | Payer: 59 | Source: Ambulatory Visit | Attending: Physician Assistant | Admitting: Physician Assistant

## 2014-08-10 ENCOUNTER — Encounter (HOSPITAL_COMMUNITY): Payer: Self-pay

## 2014-08-10 DIAGNOSIS — Z36 Encounter for antenatal screening of mother: Secondary | ICD-10-CM | POA: Insufficient documentation

## 2014-08-10 DIAGNOSIS — Z3491 Encounter for supervision of normal pregnancy, unspecified, first trimester: Secondary | ICD-10-CM

## 2014-08-10 DIAGNOSIS — Z3A13 13 weeks gestation of pregnancy: Secondary | ICD-10-CM | POA: Insufficient documentation

## 2014-08-10 DIAGNOSIS — Z3682 Encounter for antenatal screening for nuchal translucency: Secondary | ICD-10-CM | POA: Insufficient documentation

## 2014-08-29 ENCOUNTER — Ambulatory Visit (INDEPENDENT_AMBULATORY_CARE_PROVIDER_SITE_OTHER): Payer: 59 | Admitting: Advanced Practice Midwife

## 2014-08-29 VITALS — BP 120/65 | HR 87 | Temp 98.6°F | Wt 194.9 lb

## 2014-08-29 DIAGNOSIS — Z8679 Personal history of other diseases of the circulatory system: Secondary | ICD-10-CM | POA: Insufficient documentation

## 2014-08-29 DIAGNOSIS — Z3491 Encounter for supervision of normal pregnancy, unspecified, first trimester: Secondary | ICD-10-CM

## 2014-08-29 DIAGNOSIS — Z1379 Encounter for other screening for genetic and chromosomal anomalies: Secondary | ICD-10-CM

## 2014-08-29 DIAGNOSIS — Z3481 Encounter for supervision of other normal pregnancy, first trimester: Secondary | ICD-10-CM

## 2014-08-29 LAB — POCT URINALYSIS DIP (DEVICE)
Bilirubin Urine: NEGATIVE
Glucose, UA: NEGATIVE mg/dL
Hgb urine dipstick: NEGATIVE
Ketones, ur: NEGATIVE mg/dL
Leukocytes, UA: NEGATIVE
NITRITE: NEGATIVE
PROTEIN: NEGATIVE mg/dL
SPECIFIC GRAVITY, URINE: 1.025 (ref 1.005–1.030)
UROBILINOGEN UA: 0.2 mg/dL (ref 0.0–1.0)
pH: 6 (ref 5.0–8.0)

## 2014-08-29 NOTE — Patient Instructions (Signed)
Second Trimester of Pregnancy The second trimester is from week 13 through week 28, months 4 through 6. The second trimester is often a time when you feel your best. Your body has also adjusted to being pregnant, and you begin to feel better physically. Usually, morning sickness has lessened or quit completely, you may have more energy, and you may have an increase in appetite. The second trimester is also a time when the fetus is growing rapidly. At the end of the sixth month, the fetus is about 9 inches long and weighs about 1 pounds. You will likely begin to feel the baby move (quickening) between 18 and 20 weeks of the pregnancy. BODY CHANGES Your body goes through many changes during pregnancy. The changes vary from woman to woman.   Your weight will continue to increase. You will notice your lower abdomen bulging out.  You may begin to get stretch marks on your hips, abdomen, and breasts.  You may develop headaches that can be relieved by medicines approved by your health care provider.  You may urinate more often because the fetus is pressing on your bladder.  You may develop or continue to have heartburn as a result of your pregnancy.  You may develop constipation because certain hormones are causing the muscles that push waste through your intestines to slow down.  You may develop hemorrhoids or swollen, bulging veins (varicose veins).  You may have back pain because of the weight gain and pregnancy hormones relaxing your joints between the bones in your pelvis and as a result of a shift in weight and the muscles that support your balance.  Your breasts will continue to grow and be tender.  Your gums may bleed and may be sensitive to brushing and flossing.  Dark spots or blotches (chloasma, mask of pregnancy) may develop on your face. This will likely fade after the baby is born.  A dark line from your belly button to the pubic area (linea nigra) may appear. This will likely fade  after the baby is born.  You may have changes in your hair. These can include thickening of your hair, rapid growth, and changes in texture. Some women also have hair loss during or after pregnancy, or hair that feels dry or thin. Your hair will most likely return to normal after your baby is born. WHAT TO EXPECT AT YOUR PRENATAL VISITS During a routine prenatal visit:  You will be weighed to make sure you and the fetus are growing normally.  Your blood pressure will be taken.  Your abdomen will be measured to track your baby's growth.  The fetal heartbeat will be listened to.  Any test results from the previous visit will be discussed. Your health care provider may ask you:  How you are feeling.  If you are feeling the baby move.  If you have had any abnormal symptoms, such as leaking fluid, bleeding, severe headaches, or abdominal cramping.  If you have any questions. Other tests that may be performed during your second trimester include:  Blood tests that check for:  Low iron levels (anemia).  Gestational diabetes (between 24 and 28 weeks).  Rh antibodies.  Urine tests to check for infections, diabetes, or protein in the urine.  An ultrasound to confirm the proper growth and development of the baby.  An amniocentesis to check for possible genetic problems.  Fetal screens for spina bifida and Down syndrome. HOME CARE INSTRUCTIONS   Avoid all smoking, herbs, alcohol, and unprescribed   drugs. These chemicals affect the formation and growth of the baby.  Follow your health care provider's instructions regarding medicine use. There are medicines that are either safe or unsafe to take during pregnancy.  Exercise only as directed by your health care provider. Experiencing uterine cramps is a good sign to stop exercising.  Continue to eat regular, healthy meals.  Wear a good support bra for breast tenderness.  Do not use hot tubs, steam rooms, or saunas.  Wear your  seat belt at all times when driving.  Avoid raw meat, uncooked cheese, cat litter boxes, and soil used by cats. These carry germs that can cause birth defects in the baby.  Take your prenatal vitamins.  Try taking a stool softener (if your health care provider approves) if you develop constipation. Eat more high-fiber foods, such as fresh vegetables or fruit and whole grains. Drink plenty of fluids to keep your urine clear or pale yellow.  Take warm sitz baths to soothe any pain or discomfort caused by hemorrhoids. Use hemorrhoid cream if your health care provider approves.  If you develop varicose veins, wear support hose. Elevate your feet for 15 minutes, 3-4 times a day. Limit salt in your diet.  Avoid heavy lifting, wear low heel shoes, and practice good posture.  Rest with your legs elevated if you have leg cramps or low back pain.  Visit your dentist if you have not gone yet during your pregnancy. Use a soft toothbrush to brush your teeth and be gentle when you floss.  A sexual relationship may be continued unless your health care provider directs you otherwise.  Continue to go to all your prenatal visits as directed by your health care provider. SEEK MEDICAL CARE IF:   You have dizziness.  You have mild pelvic cramps, pelvic pressure, or nagging pain in the abdominal area.  You have persistent nausea, vomiting, or diarrhea.  You have a bad smelling vaginal discharge.  You have pain with urination. SEEK IMMEDIATE MEDICAL CARE IF:   You have a fever.  You are leaking fluid from your vagina.  You have spotting or bleeding from your vagina.  You have severe abdominal cramping or pain.  You have rapid weight gain or loss.  You have shortness of breath with chest pain.  You notice sudden or extreme swelling of your face, hands, ankles, feet, or legs.  You have not felt your baby move in over an hour.  You have severe headaches that do not go away with  medicine.  You have vision changes. Document Released: 05/13/2001 Document Revised: 05/24/2013 Document Reviewed: 07/20/2012 [employer] Patient Information 2015 [employer]. This information is not intended to replace advice given to you by your health care provider. Make sure you discuss any questions you have with your health care provider.  

## 2014-08-29 NOTE — Progress Notes (Addendum)
Unable to obtain NT for first trimester screen. Quad screen today.  06/2013 Borderline cardiomegaly on CXR in ED. F/U w/ cardiologist normal. ECHO done. Result not found, but pt informed no need for further F/U.  08/2014: Rec pt see cardiologist due to pregnant state.

## 2014-08-30 LAB — AFP, QUAD SCREEN
AFP: 20.7 ng/mL
Age Alone: 1:972 {titer}
Curr Gest Age: 15.5 wks.days
Down Syndrome Scr Risk Est: 1:24000 {titer}
HCG, Total: 19.03 IU/mL
INH: 61.7 pg/mL
Interpretation-AFP: NEGATIVE
MoM for AFP: 0.66
MoM for INH: 0.39
MoM for hCG: 0.49
Open Spina bifida: NEGATIVE
Osb Risk: <1:54600 {titer}
TRI 18 SCR RISK EST: NEGATIVE
uE3 Mom: 0.58
uE3 Value: 0.42 ng/mL

## 2014-09-26 ENCOUNTER — Ambulatory Visit (INDEPENDENT_AMBULATORY_CARE_PROVIDER_SITE_OTHER): Payer: 59 | Admitting: Advanced Practice Midwife

## 2014-09-26 VITALS — BP 101/59 | HR 89 | Temp 98.9°F | Wt 196.4 lb

## 2014-09-26 DIAGNOSIS — Z3481 Encounter for supervision of other normal pregnancy, first trimester: Secondary | ICD-10-CM

## 2014-09-26 DIAGNOSIS — Z3491 Encounter for supervision of normal pregnancy, unspecified, first trimester: Secondary | ICD-10-CM

## 2014-09-26 LAB — POCT URINALYSIS DIP (DEVICE)
Bilirubin Urine: NEGATIVE
Glucose, UA: NEGATIVE mg/dL
Hgb urine dipstick: NEGATIVE
Ketones, ur: NEGATIVE mg/dL
Leukocytes, UA: NEGATIVE
Nitrite: NEGATIVE
PROTEIN: NEGATIVE mg/dL
Specific Gravity, Urine: 1.02 (ref 1.005–1.030)
Urobilinogen, UA: 0.2 mg/dL (ref 0.0–1.0)
pH: 7 (ref 5.0–8.0)

## 2014-09-26 NOTE — Progress Notes (Signed)
Doing well.  Good fetal movement, denies vaginal bleeding, LOF, cramping/contractions. Questions about foods causing nausea, milk causing her to feel sick. Does eat yogurt, cheese etc.  Takes PNV.  Discussed calcium rich foods, Ok to stop drinking milk if it is bothering her.  Has not contacted cardiology yet for f/u, see note from last visit.  Pt to set up cardiology visit.  U/S scheduled Thursday.

## 2014-09-28 ENCOUNTER — Ambulatory Visit (HOSPITAL_COMMUNITY)
Admission: RE | Admit: 2014-09-28 | Discharge: 2014-09-28 | Disposition: A | Discharge status: Home / Self Care | Payer: 59 | Source: Ambulatory Visit | Attending: Advanced Practice Midwife | Admitting: Advanced Practice Midwife

## 2014-09-28 ENCOUNTER — Other Ambulatory Visit: Payer: Self-pay | Admitting: Advanced Practice Midwife

## 2014-09-28 DIAGNOSIS — O99212 Obesity complicating pregnancy, second trimester: Secondary | ICD-10-CM

## 2014-09-28 DIAGNOSIS — Z3689 Encounter for other specified antenatal screening: Secondary | ICD-10-CM | POA: Insufficient documentation

## 2014-09-28 DIAGNOSIS — Z36 Encounter for antenatal screening of mother: Secondary | ICD-10-CM | POA: Insufficient documentation

## 2014-09-28 DIAGNOSIS — Z3491 Encounter for supervision of normal pregnancy, unspecified, first trimester: Secondary | ICD-10-CM

## 2014-09-28 DIAGNOSIS — Z3A20 20 weeks gestation of pregnancy: Secondary | ICD-10-CM | POA: Insufficient documentation

## 2014-10-24 ENCOUNTER — Ambulatory Visit (INDEPENDENT_AMBULATORY_CARE_PROVIDER_SITE_OTHER): Payer: 59 | Admitting: Advanced Practice Midwife

## 2014-10-24 VITALS — BP 115/51 | HR 91 | Temp 98.1°F | Wt 200.1 lb

## 2014-10-24 DIAGNOSIS — Z0489 Encounter for examination and observation for other specified reasons: Secondary | ICD-10-CM

## 2014-10-24 DIAGNOSIS — Z36 Encounter for antenatal screening of mother: Secondary | ICD-10-CM

## 2014-10-24 DIAGNOSIS — Z3491 Encounter for supervision of normal pregnancy, unspecified, first trimester: Secondary | ICD-10-CM

## 2014-10-24 DIAGNOSIS — IMO0002 Reserved for concepts with insufficient information to code with codable children: Secondary | ICD-10-CM

## 2014-10-24 DIAGNOSIS — Z3481 Encounter for supervision of other normal pregnancy, first trimester: Secondary | ICD-10-CM

## 2014-10-24 LAB — POCT URINALYSIS DIP (DEVICE)
BILIRUBIN URINE: NEGATIVE
Glucose, UA: NEGATIVE mg/dL
Hgb urine dipstick: NEGATIVE
KETONES UR: NEGATIVE mg/dL
LEUKOCYTES UA: NEGATIVE
Nitrite: NEGATIVE
PROTEIN: NEGATIVE mg/dL
Specific Gravity, Urine: 1.025 (ref 1.005–1.030)
Urobilinogen, UA: 0.2 mg/dL (ref 0.0–1.0)
pH: 6.5 (ref 5.0–8.0)

## 2014-10-24 NOTE — Progress Notes (Signed)
F/U US ordered for incomplete anatomy. Cautioned about excessive weight gain.

## 2014-10-24 NOTE — Patient Instructions (Signed)

## 2014-11-07 ENCOUNTER — Ambulatory Visit (HOSPITAL_COMMUNITY)
Admission: RE | Admit: 2014-11-07 | Discharge: 2014-11-07 | Disposition: A | Discharge status: Home / Self Care | Payer: 59 | Source: Ambulatory Visit | Attending: Advanced Practice Midwife | Admitting: Advanced Practice Midwife

## 2014-11-07 DIAGNOSIS — Z3491 Encounter for supervision of normal pregnancy, unspecified, first trimester: Secondary | ICD-10-CM

## 2014-11-07 DIAGNOSIS — Z36 Encounter for antenatal screening of mother: Secondary | ICD-10-CM | POA: Insufficient documentation

## 2014-11-07 DIAGNOSIS — Z0489 Encounter for examination and observation for other specified reasons: Secondary | ICD-10-CM

## 2014-11-07 DIAGNOSIS — IMO0002 Reserved for concepts with insufficient information to code with codable children: Secondary | ICD-10-CM

## 2014-11-08 DIAGNOSIS — Z3A25 25 weeks gestation of pregnancy: Secondary | ICD-10-CM | POA: Insufficient documentation

## 2014-11-08 DIAGNOSIS — IMO0002 Reserved for concepts with insufficient information to code with codable children: Secondary | ICD-10-CM | POA: Insufficient documentation

## 2014-11-08 DIAGNOSIS — O99212 Obesity complicating pregnancy, second trimester: Secondary | ICD-10-CM | POA: Insufficient documentation

## 2014-11-08 DIAGNOSIS — Z0489 Encounter for examination and observation for other specified reasons: Secondary | ICD-10-CM | POA: Insufficient documentation

## 2014-11-21 ENCOUNTER — Encounter: Payer: Self-pay | Admitting: Obstetrics and Gynecology

## 2014-11-21 ENCOUNTER — Ambulatory Visit (INDEPENDENT_AMBULATORY_CARE_PROVIDER_SITE_OTHER): Payer: 59 | Admitting: Obstetrics and Gynecology

## 2014-11-21 VITALS — BP 113/61 | HR 92 | Temp 98.3°F | Wt 200.7 lb

## 2014-11-21 DIAGNOSIS — Z23 Encounter for immunization: Secondary | ICD-10-CM | POA: Diagnosis not present

## 2014-11-21 DIAGNOSIS — Z3491 Encounter for supervision of normal pregnancy, unspecified, first trimester: Secondary | ICD-10-CM

## 2014-11-21 DIAGNOSIS — Z3481 Encounter for supervision of other normal pregnancy, first trimester: Secondary | ICD-10-CM

## 2014-11-21 DIAGNOSIS — O99212 Obesity complicating pregnancy, second trimester: Secondary | ICD-10-CM | POA: Diagnosis not present

## 2014-11-21 DIAGNOSIS — E669 Obesity, unspecified: Secondary | ICD-10-CM

## 2014-11-21 LAB — CBC
HCT: 32.2 % — ABNORMAL LOW (ref 36.0–46.0)
HEMOGLOBIN: 10.3 g/dL — AB (ref 12.0–15.0)
MCH: 28 pg (ref 26.0–34.0)
MCHC: 32 g/dL (ref 30.0–36.0)
MCV: 87.5 fL (ref 78.0–100.0)
MPV: 9 fL (ref 8.6–12.4)
Platelets: 275 10*3/uL (ref 150–400)
RBC: 3.68 MIL/uL — AB (ref 3.87–5.11)
RDW: 13.5 % (ref 11.5–15.5)
WBC: 7.6 10*3/uL (ref 4.0–10.5)

## 2014-11-21 LAB — POCT URINALYSIS DIP (DEVICE)
Bilirubin Urine: NEGATIVE
Glucose, UA: 100 mg/dL — AB
Ketones, ur: NEGATIVE mg/dL
Leukocytes, UA: NEGATIVE
Nitrite: NEGATIVE
Protein, ur: NEGATIVE mg/dL
Specific Gravity, Urine: 1.025 (ref 1.005–1.030)
Urobilinogen, UA: 0.2 mg/dL (ref 0.0–1.0)
pH: 6.5 (ref 5.0–8.0)

## 2014-11-21 MED ORDER — TETANUS-DIPHTH-ACELL PERTUSSIS 5-2.5-18.5 LF-MCG/0.5 IM SUSP
0.5000 mL | Freq: Once | INTRAMUSCULAR | Status: AC
  Administered 2014-11-21: 0.5 mL via INTRAMUSCULAR

## 2014-11-21 NOTE — Progress Notes (Signed)
C/o occasional stomach pain-- generalized.  Discussed breastfeeding tip of the week.

## 2014-11-21 NOTE — Progress Notes (Signed)
Subjective:  Vidhi L Rienstra is a 29 y.o. G2P0010 at [redacted]w[redacted]d being seen today for ongoing prenatal care.  Patient reports right lower back molar toothache preesent for a few months.  Had episode abdominal tightening lasting 5 min 2 days ago. No pain since. Works Personnel officer at [employer].   Contractions: Not present.  Vag. Bleeding: None. Movement: Present. Denies leaking of fluid.   The following portions of the patient's history were reviewed and updated as appropriate: allergies, current medications, past family history, past medical history, past social history, past surgical history and problem list.   Objective:   Filed Vitals:   11/21/14 0827  BP: 113/61  Pulse: 92  Temp: 98.3 F (36.8 C)  Weight: 200 lb 11.2 oz (91.037 kg)    Fetal Status: Fetal Heart Rate (bpm): 153   Movement: Present     General:  Alert, oriented and cooperative. Patient is in no acute distress.  Skin: Skin is warm and dry. No rash noted.   Cardiovascular: Normal heart rate noted  Respiratory: Effort and breath sounds normal, no problems with respiration noted  Abdomen: Soft, gravid, appropriate for gestational age. Pain/Pressure: Present     Vaginal: Vag. Bleeding: None.       Cervix: Not evaluated       Extremities: Normal range of motion.  Edema: None  Mental Status: Normal mood and affect. Normal behavior. Normal judgment and thought content.   Urinalysis: Urine Protein: Negative Urine Glucose: 1+  Assessment and Plan:  Pregnancy: G2P0010 at [redacted]w[redacted]d  1. Obesity complicating pregnancy in second trimester Toothache - Glucose Tolerance, 1 HR (50g) w/o Fasting - CBC - HIV antibody (with reflex) - [facility]  2. Supervision of normal pregnancy in first trimester  - Glucose Tolerance, 1 HR (50g) w/o Fasting - CBC - HIV antibody (with reflex) - [facility] Dental referral  Preterm labor symptoms and general obstetric precautions including but not limited to vaginal bleeding, contractions, leaking of fluid and fetal  movement were reviewed in detail with the patient.  Please refer to After Visit Summary for other counseling recommendations.   No Follow-up on file.   Danae Orleans, CNM

## 2014-11-21 NOTE — Patient Instructions (Signed)
Third Trimester of Pregnancy The third trimester is from week 29 through week 42, months 7 through 9. The third trimester is a time when the fetus is growing rapidly. At the end of the ninth month, the fetus is about 20 inches in length and weighs 6-10 pounds.  BODY CHANGES Your body goes through many changes during pregnancy. The changes vary from woman to woman.   Your weight will continue to increase. You can expect to gain 25-35 pounds (11-16 kg) by the end of the pregnancy.  You may begin to get stretch marks on your hips, abdomen, and breasts.  You may urinate more often because the fetus is moving lower into your pelvis and pressing on your bladder.  You may develop or continue to have heartburn as a result of your pregnancy.  You may develop constipation because certain hormones are causing the muscles that push waste through your intestines to slow down.  You may develop hemorrhoids or swollen, bulging veins (varicose veins).  You may have pelvic pain because of the weight gain and pregnancy hormones relaxing your joints between the bones in your pelvis. Backaches may result from overexertion of the muscles supporting your posture.  You may have changes in your hair. These can include thickening of your hair, rapid growth, and changes in texture. Some women also have hair loss during or after pregnancy, or hair that feels dry or thin. Your hair will most likely return to normal after your baby is born.  Your breasts will continue to grow and be tender. A yellow discharge may leak from your breasts called colostrum.  Your belly button may stick out.  You may feel short of breath because of your expanding uterus.  You may notice the fetus "dropping," or moving lower in your abdomen.  You may have a bloody mucus discharge. This usually occurs a few days to a week before labor begins.  Your cervix becomes thin and soft (effaced) near your due date. WHAT TO EXPECT AT YOUR PRENATAL  EXAMS  You will have prenatal exams every 2 weeks until week 36. Then, you will have weekly prenatal exams. During a routine prenatal visit:  You will be weighed to make sure you and the fetus are growing normally.  Your blood pressure is taken.  Your abdomen will be measured to track your baby's growth.  The fetal heartbeat will be listened to.  Any test results from the previous visit will be discussed.  You may have a cervical check near your due date to see if you have effaced. At around 36 weeks, your caregiver will check your cervix. At the same time, your caregiver will also perform a test on the secretions of the vaginal tissue. This test is to determine if a type of bacteria, Group B streptococcus, is present. Your caregiver will explain this further. Your caregiver may ask you:  What your birth plan is.  How you are feeling.  If you are feeling the baby move.  If you have had any abnormal symptoms, such as leaking fluid, bleeding, severe headaches, or abdominal cramping.  If you have any questions. Other tests or screenings that may be performed during your third trimester include:  Blood tests that check for low iron levels (anemia).  Fetal testing to check the health, activity level, and growth of the fetus. Testing is done if you have certain medical conditions or if there are problems during the pregnancy. FALSE LABOR You may feel small, irregular contractions that   eventually go away. These are called Braxton Hicks contractions, or false labor. Contractions may last for hours, days, or even weeks before true labor sets in. If contractions come at regular intervals, intensify, or become painful, it is best to be seen by your caregiver.  SIGNS OF LABOR   Menstrual-like cramps.  Contractions that are 5 minutes apart or less.  Contractions that start on the top of the uterus and spread down to the lower abdomen and back.  A sense of increased pelvic pressure or back  pain.  A watery or bloody mucus discharge that comes from the vagina. If you have any of these signs before the 37th week of pregnancy, call your caregiver right away. You need to go to the hospital to get checked immediately. HOME CARE INSTRUCTIONS   Avoid all smoking, herbs, alcohol, and unprescribed drugs. These chemicals affect the formation and growth of the baby.  Follow your caregiver's instructions regarding medicine use. There are medicines that are either safe or unsafe to take during pregnancy.  Exercise only as directed by your caregiver. Experiencing uterine cramps is a good sign to stop exercising.  Continue to eat regular, healthy meals.  Wear a good support bra for breast tenderness.  Do not use hot tubs, steam rooms, or saunas.  Wear your seat belt at all times when driving.  Avoid raw meat, uncooked cheese, cat litter boxes, and soil used by cats. These carry germs that can cause birth defects in the baby.  Take your prenatal vitamins.  Try taking a stool softener (if your caregiver approves) if you develop constipation. Eat more high-fiber foods, such as fresh vegetables or fruit and whole grains. Drink plenty of fluids to keep your urine clear or pale yellow.  Take warm sitz baths to soothe any pain or discomfort caused by hemorrhoids. Use hemorrhoid cream if your caregiver approves.  If you develop varicose veins, wear support hose. Elevate your feet for 15 minutes, 3-4 times a day. Limit salt in your diet.  Avoid heavy lifting, wear low heal shoes, and practice good posture.  Rest a lot with your legs elevated if you have leg cramps or low back pain.  Visit your dentist if you have not gone during your pregnancy. Use a soft toothbrush to brush your teeth and be gentle when you floss.  A sexual relationship may be continued unless your caregiver directs you otherwise.  Do not travel far distances unless it is absolutely necessary and only with the approval  of your caregiver.  Take prenatal classes to understand, practice, and ask questions about the labor and delivery.  Make a trial run to the hospital.  Pack your hospital bag.  Prepare the baby's nursery.  Continue to go to all your prenatal visits as directed by your caregiver. SEEK MEDICAL CARE IF:  You are unsure if you are in labor or if your water has broken.  You have dizziness.  You have mild pelvic cramps, pelvic pressure, or nagging pain in your abdominal area.  You have persistent nausea, vomiting, or diarrhea.  You have a bad smelling vaginal discharge.  You have pain with urination. SEEK IMMEDIATE MEDICAL CARE IF:   You have a fever.  You are leaking fluid from your vagina.  You have spotting or bleeding from your vagina.  You have severe abdominal cramping or pain.  You have rapid weight loss or gain.  You have shortness of breath with chest pain.  You notice sudden or extreme swelling   of your face, hands, ankles, feet, or legs.  You have not felt your baby move in over an hour.  You have severe headaches that do not go away with medicine.  You have vision changes. Document Released: 05/13/2001 Document Revised: 05/24/2013 Document Reviewed: 07/20/2012 ExitCare Patient Information 2015 [employer]. This information is not intended to replace advice given to you by your health care provider. Make sure you discuss any questions you have with your health care provider.  

## 2014-11-22 LAB — HIV ANTIBODY (ROUTINE TESTING W REFLEX): HIV 1&2 Ab, 4th Generation: NONREACTIVE

## 2014-11-22 LAB — GLUCOSE TOLERANCE, 1 HOUR (50G) W/O FASTING: Glucose, 1 Hour GTT: 95 mg/dL (ref 70–140)

## 2014-11-22 LAB — RPR

## 2014-12-04 ENCOUNTER — Encounter (HOSPITAL_COMMUNITY): Payer: Self-pay | Admitting: *Deleted

## 2014-12-04 ENCOUNTER — Inpatient Hospital Stay (HOSPITAL_COMMUNITY)
Admission: AD | Admit: 2014-12-04 | Discharge: 2014-12-04 | Disposition: A | Discharge status: Home / Self Care | Payer: 59 | Source: Ambulatory Visit | Attending: Obstetrics & Gynecology | Admitting: Obstetrics & Gynecology

## 2014-12-04 DIAGNOSIS — O4703 False labor before 37 completed weeks of gestation, third trimester: Secondary | ICD-10-CM | POA: Diagnosis not present

## 2014-12-04 DIAGNOSIS — B9689 Other specified bacterial agents as the cause of diseases classified elsewhere: Secondary | ICD-10-CM | POA: Diagnosis not present

## 2014-12-04 DIAGNOSIS — Z3A29 29 weeks gestation of pregnancy: Secondary | ICD-10-CM

## 2014-12-04 DIAGNOSIS — O23593 Infection of other part of genital tract in pregnancy, third trimester: Secondary | ICD-10-CM | POA: Diagnosis not present

## 2014-12-04 DIAGNOSIS — K029 Dental caries, unspecified: Secondary | ICD-10-CM

## 2014-12-04 DIAGNOSIS — N760 Acute vaginitis: Secondary | ICD-10-CM | POA: Diagnosis not present

## 2014-12-04 DIAGNOSIS — O479 False labor, unspecified: Secondary | ICD-10-CM

## 2014-12-04 LAB — WET PREP, GENITAL
TRICH WET PREP: NONE SEEN
Yeast Wet Prep HPF POC: NONE SEEN

## 2014-12-04 LAB — URINALYSIS, ROUTINE W REFLEX MICROSCOPIC
Bilirubin Urine: NEGATIVE
GLUCOSE, UA: NEGATIVE mg/dL
Hgb urine dipstick: NEGATIVE
KETONES UR: NEGATIVE mg/dL
LEUKOCYTES UA: NEGATIVE
NITRITE: NEGATIVE
PH: 6 (ref 5.0–8.0)
Protein, ur: NEGATIVE mg/dL
Specific Gravity, Urine: 1.02 (ref 1.005–1.030)
Urobilinogen, UA: 0.2 mg/dL (ref 0.0–1.0)

## 2014-12-04 MED ORDER — OXYCODONE-ACETAMINOPHEN 5-325 MG PO TABS: 1.0000 | ORAL_TABLET | ORAL | Status: DC | PRN

## 2014-12-04 MED ORDER — METRONIDAZOLE 500 MG PO TABS: 500.0000 mg | ORAL_TABLET | Freq: Two times a day (BID) | ORAL | Status: DC

## 2014-12-04 NOTE — MAU Note (Signed)
Patient presents at [redacted] weeks gestation with c/o of contractions that occur once every 7 hours and last 5 or less seconds X 1 week. Fetus active. Denies bleeding or discharge.

## 2014-12-04 NOTE — MAU Provider Note (Signed)
History     CSN: [id number]  Arrival date and time: 12/04/14 1240   First Provider Initiated Contact with Patient 12/04/14 1336      Chief Complaint  Patient presents with  . Contractions   HPI Comments: Nicole Brooks is a 29 y.o. G2P0010 at [redacted]w[redacted]d presenting with mildly painful abdominal tightening that occurs rarely, once yesterday and once today lasting about 15 seconds. Also had similar pain about twice over last few weeks. Does not relate it to FM or her activity.  Good fetal movement. No leakage of fluid or vaginal bleeding.  Has intermittent vaginal itch, white discharge.  Also having right lower molar dental pain constantly throughout pregnancy. Had dental referral from [facility] but was told by office not accepting new pts. Denies radiating pain or fever/chills.     Abdominal Pain This is a recurrent problem. The current episode started today. The onset quality is undetermined. The problem occurs intermittently. The problem has been unchanged. The pain is located in the suprapubic region and LLQ. The pain is at a severity of 3/10. The pain is mild. The quality of the pain is cramping. Associated symptoms include myalgias. Pertinent negatives include no constipation, diarrhea, dysuria, fever, frequency, headaches, hematuria or vomiting. Nothing aggravates the pain. The pain is relieved by nothing. She has tried nothing for the symptoms.   Prenatal care at [facility]: obesity, normal echo for hx cardiomegaly; GCT nl. OB History  Gravida Para Term Preterm AB SAB TAB Ectopic Multiple Living     # Outcome Date GA Lbr Len/2nd Weight Sex Delivery Anes PTL Lv  2 Current           1 SAB 2013            First tri SAB with D&E    Past Medical History  Diagnosis Date  . Asthma   . Cardiomegaly     Past Surgical History  Procedure Laterality Date  . Dilation and evacuation  05/17/2012    Procedure: DILATATION AND EVACUATION;  Surgeon: Willodean Rosenthal, MD;   Location: [facility];  Service: Gynecology;  Laterality: N/A;  . Dilation and evacuation  05/17/2012    Family History  Problem Relation Age of Onset  . Diabetes Mother   . Hypertension Mother     History  Substance Use Topics  . Smoking status: Never Smoker   . Smokeless tobacco: Never Used  . Alcohol Use: No    Allergies: No Known Allergies  Prescriptions prior to admission  Medication Sig Dispense Refill Last Dose  . prenatal vitamin w/FE, FA (PRENATAL 1 + 1) 27-1 MG TABS tablet Take 1 tablet by mouth daily at 12 noon.   12/04/2014 at Unknown time  . acetaminophen (TYLENOL) 500 MG tablet Take 1 tablet (500 mg total) by mouth every 6 (six) hours as needed. (Patient not taking: Reported on 12/04/2014) 30 tablet 0 Taking    Review of Systems  Constitutional: Negative for fever and chills.  Cardiovascular: Negative for chest pain and palpitations.  Gastrointestinal: Positive for abdominal pain. Negative for vomiting, diarrhea and constipation.  Genitourinary: Negative for dysuria, frequency and hematuria.  Musculoskeletal: Positive for myalgias.  Neurological: Negative for dizziness, tingling and headaches.  Psychiatric/Behavioral: Negative for depression.   Physical Exam   Blood pressure 128/70, pulse 89, temperature 98.5 F (36.9 C), resp. rate 20, height 5' (1.524 m), weight 90.776 kg (200 lb 2 oz), last menstrual period 04/24/2014.  Physical  Exam  Vitals reviewed. Constitutional: She is oriented to person, place, and time. She appears well-developed and well-nourished. No distress.  HENT:  Head: Normocephalic.  Left lower molar appears carious, greyish, no exudate, swelling or gingivitis noted  Eyes: Pupils are equal, round, and reactive to light.  Neck: Normal range of motion. Neck supple.  Cardiovascular: Normal rate.   Respiratory: Effort normal.  GI: Soft.  Genitourinary: Vaginal discharge found.  Homogenous white discharge. Cx posterior ext 1, int closed, long,  high Uterus NT; S=D  Musculoskeletal: Normal range of motion.  Neurological: She is alert and oriented to person, place, and time.  Skin: Skin is warm and dry.  Psychiatric: She has a normal mood and affect. Her behavior is normal. Thought content normal.   EFM: Baseline 145; 10bpm accelerations, no decels Toco: no UCs  MAU Course  Procedures Results for orders placed or performed during the hospital encounter of 12/04/14 (from the past 24 hour(s))  Urinalysis, Routine w reflex microscopic (not at [facility])     Status: None   Collection Time: 12/04/14  1:02 PM  Result Value Ref Range   Color, Urine YELLOW YELLOW   APPearance CLEAR CLEAR   Specific Gravity, Urine 1.020 1.005 - 1.030   pH 6.0 5.0 - 8.0   Glucose, UA NEGATIVE NEGATIVE mg/dL   Hgb urine dipstick NEGATIVE NEGATIVE   Bilirubin Urine NEGATIVE NEGATIVE   Ketones, ur NEGATIVE NEGATIVE mg/dL   Protein, ur NEGATIVE NEGATIVE mg/dL   Urobilinogen, UA 0.2 0.0 - 1.0 mg/dL   Nitrite NEGATIVE NEGATIVE   Leukocytes, UA NEGATIVE NEGATIVE  Wet prep, genital     Status: Abnormal   Collection Time: 12/04/14  1:56 PM  Result Value Ref Range   Yeast Wet Prep HPF POC NONE SEEN NONE SEEN   Trich, Wet Prep NONE SEEN NONE SEEN   Clue Cells Wet Prep HPF POC FEW (A) NONE SEEN   WBC, Wet Prep HPF POC FEW (A) NONE SEEN      Assessment and Plan  Braxton Hick's contraction  Dental cavity  BV (bacterial vaginosis)  Meds ordered this encounter  Medications  . oxyCODONE-acetaminophen (PERCOCET/ROXICET) 5-325 MG per tablet    Sig: Take 1 tablet by mouth every 4 (four) hours as needed for severe pain.    Dispense:  6 tablet    Refill:  0    Order Specific Question:  Supervising Provider    Answer:  LEGGETT, KELLY H [[id number]]  . metroNIDAZOLE (FLAGYL) 500 MG tablet    Sig: Take 1 tablet (500 mg total) by mouth 2 (two) times daily.    Dispense:  14 tablet    Refill:  0    Order Specific Question:  Supervising Provider    Answer:  Penne LashLEGGETT,  KELLY H [[id number]]  Acetaminphen up to 3 Gm/24 hr and Percocet (#6) for breakthough dental pain Follow-up Information    [facility].   Specialty:  Obstetrics and Gynecology   Why:  Keep your scheduled prenatal appointment   Contact information:   [address] [place] [id number] [phone]     Discharge home with reassurance re B-H contractions POE,DEIRDRE 12/04/2014, 1:53 PM

## 2014-12-04 NOTE — Discharge Instructions (Signed)
Bacterial Vaginosis Bacterial vaginosis is a vaginal infection that occurs when the normal balance of bacteria in the vagina is disrupted. It results from an overgrowth of certain bacteria. This is the most common vaginal infection in women of childbearing age. Treatment is important to prevent complications, especially in pregnant women, as it can cause a premature delivery. CAUSES  Bacterial vaginosis is caused by an increase in harmful bacteria that are normally present in smaller amounts in the vagina. Several different kinds of bacteria can cause bacterial vaginosis. However, the reason that the condition develops is not fully understood. RISK FACTORS Certain activities or behaviors can put you at an increased risk of developing bacterial vaginosis, including:  Having a new sex partner or multiple sex partners.  Douching.  Using an intrauterine device (IUD) for contraception. Women do not get bacterial vaginosis from toilet seats, bedding, swimming pools, or contact with objects around them. SIGNS AND SYMPTOMS  Some women with bacterial vaginosis have no signs or symptoms. Common symptoms include:  Grey vaginal discharge.  A fishlike odor with discharge, especially after sexual intercourse.  Itching or burning of the vagina and vulva.  Burning or pain with urination. DIAGNOSIS  Your health care provider will take a medical history and examine the vagina for signs of bacterial vaginosis. A sample of vaginal fluid may be taken. Your health care provider will look at this sample under a microscope to check for bacteria and abnormal cells. A vaginal pH test may also be done.  TREATMENT  Bacterial vaginosis may be treated with antibiotic medicines. These may be given in the form of a pill or a vaginal cream. A second round of antibiotics may be prescribed if the condition comes back after treatment.  HOME CARE INSTRUCTIONS   Only take over-the-counter or prescription medicines as  directed by your health care provider.  If antibiotic medicine was prescribed, take it as directed. Make sure you finish it even if you start to feel better.  Do not have sex until treatment is completed.  Tell all sexual partners that you have a vaginal infection. They should see their health care provider and be treated if they have problems, such as a mild rash or itching.  Practice safe sex by using condoms and only having one sex partner. SEEK MEDICAL CARE IF:   Your symptoms are not improving after 3 days of treatment.  You have increased discharge or pain.  You have a fever. MAKE SURE YOU:   Understand these instructions.  Will watch your condition.  Will get help right away if you are not doing well or get worse. FOR MORE INFORMATION  Centers for Disease Control and Prevention, Division of STD Prevention: www.cdc.gov/std American Sexual Health Association (ASHA): www.ashastd.org  Document Released: 05/19/2005 Document Revised: 03/09/2013 Document Reviewed: 12/29/2012 ExitCare Patient Information 2015 [employer]. This information is not intended to replace advice given to you by your health care provider. Make sure you discuss any questions you have with your health care provider.  

## 2014-12-05 ENCOUNTER — Encounter: Payer: 59 | Admitting: Obstetrics & Gynecology

## 2014-12-06 ENCOUNTER — Ambulatory Visit (INDEPENDENT_AMBULATORY_CARE_PROVIDER_SITE_OTHER): Payer: 59 | Admitting: Advanced Practice Midwife

## 2014-12-06 VITALS — BP 112/60 | HR 89 | Wt 202.2 lb

## 2014-12-06 DIAGNOSIS — Z3481 Encounter for supervision of other normal pregnancy, first trimester: Secondary | ICD-10-CM | POA: Diagnosis not present

## 2014-12-06 DIAGNOSIS — Z3491 Encounter for supervision of normal pregnancy, unspecified, first trimester: Secondary | ICD-10-CM

## 2014-12-06 LAB — POCT URINALYSIS DIP (DEVICE)
Bilirubin Urine: NEGATIVE
GLUCOSE, UA: 100 mg/dL — AB
Hgb urine dipstick: NEGATIVE
Ketones, ur: NEGATIVE mg/dL
NITRITE: NEGATIVE
Protein, ur: NEGATIVE mg/dL
SPECIFIC GRAVITY, URINE: 1.025 (ref 1.005–1.030)
UROBILINOGEN UA: 0.2 mg/dL (ref 0.0–1.0)
pH: 6.5 (ref 5.0–8.0)

## 2014-12-06 MED ORDER — CLINDAMYCIN HCL 300 MG PO CAPS: 300.0000 mg | ORAL_CAPSULE | Freq: Two times a day (BID) | ORAL | Status: DC

## 2014-12-06 NOTE — Progress Notes (Signed)
Came to MAU for c/o braxton hicks contractions. States yesterday after took flagyl , felt like baby moved a lot less for about 8 hours after the flagyl, then normal again.

## 2014-12-06 NOTE — Progress Notes (Signed)
Seen in MAU 12/04/14 for braxton Hicks, BV. Rx Flagyl. Braxton Hicks resolved. Thinks Flagyl made her baby not move for 8 hours yesterday. Normal mvmt since then. Explained that it is highly unlikely that the Flagyl caused decreased FM, but offered pt Clinda as alternative. FKCs reviewed. Reviewed normal 28 week labs.

## 2014-12-06 NOTE — Patient Instructions (Addendum)
Third Trimester of Pregnancy The third trimester is from week 29 through week 42, months 7 through 9. The third trimester is a time when the fetus is growing rapidly. At the end of the ninth month, the fetus is about 20 inches in length and weighs 6-10 pounds.  BODY CHANGES Your body goes through many changes during pregnancy. The changes vary from woman to woman.   Your weight will continue to increase. You can expect to gain 25-35 pounds (11-16 kg) by the end of the pregnancy.  You may begin to get stretch marks on your hips, abdomen, and breasts.  You may urinate more often because the fetus is moving lower into your pelvis and pressing on your bladder.  You may develop or continue to have heartburn as a result of your pregnancy.  You may develop constipation because certain hormones are causing the muscles that push waste through your intestines to slow down.  You may develop hemorrhoids or swollen, bulging veins (varicose veins).  You may have pelvic pain because of the weight gain and pregnancy hormones relaxing your joints between the bones in your pelvis. Backaches may result from overexertion of the muscles supporting your posture.  You may have changes in your hair. These can include thickening of your hair, rapid growth, and changes in texture. Some women also have hair loss during or after pregnancy, or hair that feels dry or thin. Your hair will most likely return to normal after your baby is born.  Your breasts will continue to grow and be tender. A yellow discharge may leak from your breasts called colostrum.  Your belly button may stick out.  You may feel short of breath because of your expanding uterus.  You may notice the fetus "dropping," or moving lower in your abdomen.  You may have a bloody mucus discharge. This usually occurs a few days to a week before labor begins.  Your cervix becomes thin and soft (effaced) near your due date. WHAT TO EXPECT AT YOUR PRENATAL  EXAMS  You will have prenatal exams every 2 weeks until week 36. Then, you will have weekly prenatal exams. During a routine prenatal visit:  You will be weighed to make sure you and the fetus are growing normally.  Your blood pressure is taken.  Your abdomen will be measured to track your baby's growth.  The fetal heartbeat will be listened to.  Any test results from the previous visit will be discussed.  You may have a cervical check near your due date to see if you have effaced. At around 36 weeks, your caregiver will check your cervix. At the same time, your caregiver will also perform a test on the secretions of the vaginal tissue. This test is to determine if a type of bacteria, Group B streptococcus, is present. Your caregiver will explain this further. Your caregiver may ask you:  What your birth plan is.  How you are feeling.  If you are feeling the baby move.  If you have had any abnormal symptoms, such as leaking fluid, bleeding, severe headaches, or abdominal cramping.  If you have any questions. Other tests or screenings that may be performed during your third trimester include:  Blood tests that check for low iron levels (anemia).  Fetal testing to check the health, activity level, and growth of the fetus. Testing is done if you have certain medical conditions or if there are problems during the pregnancy. FALSE LABOR You may feel small, irregular contractions that   eventually go away. These are called Braxton Hicks contractions, or false labor. Contractions may last for hours, days, or even weeks before true labor sets in. If contractions come at regular intervals, intensify, or become painful, it is best to be seen by your caregiver.  SIGNS OF LABOR   Menstrual-like cramps.  Contractions that are 5 minutes apart or less.  Contractions that start on the top of the uterus and spread down to the lower abdomen and back.  A sense of increased pelvic pressure or back  pain.  A watery or bloody mucus discharge that comes from the vagina. If you have any of these signs before the 37th week of pregnancy, call your caregiver right away. You need to go to the hospital to get checked immediately. HOME CARE INSTRUCTIONS   Avoid all smoking, herbs, alcohol, and unprescribed drugs. These chemicals affect the formation and growth of the baby.  Follow your caregiver's instructions regarding medicine use. There are medicines that are either safe or unsafe to take during pregnancy.  Exercise only as directed by your caregiver. Experiencing uterine cramps is a good sign to stop exercising.  Continue to eat regular, healthy meals.  Wear a good support bra for breast tenderness.  Do not use hot tubs, steam rooms, or saunas.  Wear your seat belt at all times when driving.  Avoid raw meat, uncooked cheese, cat litter boxes, and soil used by cats. These carry germs that can cause birth defects in the baby.  Take your prenatal vitamins.  Try taking a stool softener (if your caregiver approves) if you develop constipation. Eat more high-fiber foods, such as fresh vegetables or fruit and whole grains. Drink plenty of fluids to keep your urine clear or pale yellow.  Take warm sitz baths to soothe any pain or discomfort caused by hemorrhoids. Use hemorrhoid cream if your caregiver approves.  If you develop varicose veins, wear support hose. Elevate your feet for 15 minutes, 3-4 times a day. Limit salt in your diet.  Avoid heavy lifting, wear low heal shoes, and practice good posture.  Rest a lot with your legs elevated if you have leg cramps or low back pain.  Visit your dentist if you have not gone during your pregnancy. Use a soft toothbrush to brush your teeth and be gentle when you floss.  A sexual relationship may be continued unless your caregiver directs you otherwise.  Do not travel far distances unless it is absolutely necessary and only with the approval  of your caregiver.  Take prenatal classes to understand, practice, and ask questions about the labor and delivery.  Make a trial run to the hospital.  Pack your hospital bag.  Prepare the baby's nursery.  Continue to go to all your prenatal visits as directed by your caregiver. SEEK MEDICAL CARE IF:  You are unsure if you are in labor or if your water has broken.  You have dizziness.  You have mild pelvic cramps, pelvic pressure, or nagging pain in your abdominal area.  You have persistent nausea, vomiting, or diarrhea.  You have a bad smelling vaginal discharge.  You have pain with urination. SEEK IMMEDIATE MEDICAL CARE IF:   You have a fever.  You are leaking fluid from your vagina.  You have spotting or bleeding from your vagina.  You have severe abdominal cramping or pain.  You have rapid weight loss or gain.  You have shortness of breath with chest pain.  You notice sudden or extreme swelling   of your face, hands, ankles, feet, or legs.  You have not felt your baby move in over an hour.  You have severe headaches that do not go away with medicine.  You have vision changes. Document Released: 05/13/2001 Document Revised: 05/24/2013 Document Reviewed: 07/20/2012 [facility] Patient Information 2015 [place], [place]. This information is not intended to replace advice given to you by your health care provider. Make sure you discuss any questions you have with your health care provider.  Breastfeeding Challenges and Solutions Even though breastfeeding is natural, it can be challenging, especially in the first few weeks after childbirth. It is normal for problems to arise when starting to breastfeed your new baby, even if you have breastfed before. This document provides some solutions to the most common breastfeeding challenges.  CHALLENGES AND SOLUTIONS Challenge--Cracked or Sore Nipples Cracked or sore nipples are commonly experienced by breastfeeding mothers. Cracked  or sore nipples often are caused by inadequate latching (when your baby's mouth attaches to your breast to breastfeed). Soreness can also happen if your baby is not positioned properly at your breast. Although nipple cracking and soreness are common during the first week after birth, nipple pain is never normal. If you experience nipple cracking or soreness that lasts longer than 1 week or nipple pain, call your health care provider or lactation consultant.  Solution Ensure proper latching and positioning of your baby by following the steps below:  Find a comfortable place to sit or lie down, with your neck and back well supported.  Place a pillow or rolled up blanket under your baby to bring him or her to the level of your breast (if you are seated).  Make sure that your baby's abdomen is facing your abdomen.  Gently massage your breast. With your fingertips, massage from your chest wall toward your nipple in a circular motion. This encourages milk flow. You may need to continue this action during the feeding if your milk flows slowly.  Support your breast with 4 fingers underneath and your thumb above your nipple. Make sure your fingers are well away from your nipple and your baby's mouth.  Stroke your baby's lips gently with your finger or nipple.  When your baby's mouth is open wide enough, quickly bring your baby to your breast, placing your entire nipple and as much of the colored area around your nipple (areola) as possible into your baby's mouth.  More areola should be visible above your baby's upper lip than below the lower lip.  Your baby's tongue should be between his or her lower gum and your breast.  Ensure that your baby's mouth is correctly positioned around your nipple (latched). Your baby's lips should create a seal on your breast and be turned out (everted).  It is common for your baby to suck for about 2-3 minutes in order to start the flow of breast milk. Signs that your  baby has successfully latched on to your nipple include:   Quietly tugging or quietly sucking without causing you pain.   Swallowing heard between every 3-4 sucks.   Muscle movement above and in front of his or her ears with sucking.  Signs that your baby has not successfully latched on to nipple include:   Sucking sounds or smacking sounds from your baby while nursing.   Nipple pain.  Ensure that your breasts stay moisturized and healthy by:  Avoiding the use of soap on your nipples.   Wearing a supportive bra. Avoid wearing underwire-style bras or  tight bras.   Air drying your nipples for 3-4 minutes after each feeding.   Using only cotton bra pads to absorb breast milk leakage. Leaking of breast milk between feedings is normal. Be sure to change the pads if they become soaked with milk.  Using lanolin on your nipples after nursing. Lanolin helps to maintain your skin's normal moisture barrier. If you use pure lanolin you do not need to wash it off before feeding your baby again. Pure lanolin is not toxic to your baby. You may also hand express a few drops of breast milk and gently massage that milk into your nipples, allowing it to air dry. Challenge--Breast Engorgement Breast engorgement is the overfilling of your breasts with breast milk. In the first few weeks after giving birth, you may experience breast engorgement. Breast engorgement can make your breasts throb and feel hard, tightly stretched, warm, and tender. Engorgement peaks about the fifth day after you give birth. Having breast engorgement does not mean you have to stop breastfeeding your baby. Solution  Breastfeed when you feel the need to reduce the fullness of your breasts or when your baby shows signs of hunger. This is called "breastfeeding on demand."  Newborns (babies younger than 4 weeks) often breastfeed every 1-3 hours during the day. You may need to awaken your baby to feed if he or she is asleep at a  feeding time.  Do not allow your baby to sleep longer than 5 hours during the night without a feeding.  Pump or hand express breast milk before breastfeeding to soften your breast, areola, and nipple.  Apply warm, moist heat (in the shower or with warm water-soaked hand towels) just before feeding or pumping, or massage your breast before or during breastfeeding. This increases circulation and helps your milk to flow.  Completely empty your breasts when breastfeeding or pumping. Afterward, wear a snug bra (nursing or regular) or tank top for 1-2 days to signal your body to slightly decrease milk production. Only wear snug bras or tank tops to treat engorgement. Tight bras typically should be avoided by breastfeeding mothers. Once engorgement is relieved, return to wearing regular, loose-fitting clothes.  Apply ice packs to your breasts to lessen the pain from engorgement and relieve swelling, unless the ice is uncomfortable for you.  Do not delay feedings. Try to relax when it is time to feed your baby. This helps to trigger your "let-down reflex," which releases milk from your breast.  Ensure your baby is latched on to your breast and positioned properly while breastfeeding.  Allow your baby to remain at your breast as long as he or she is latched on well and actively sucking. Your baby will let you know when he or she is done breastfeeding by pulling away from your breast or falling asleep.  Avoid introducing bottles or pacifiers to your baby in the early weeks of breastfeeding. Wait to introduce these things until after resolving any breastfeeding challenges.  Try to pump your milk on the same schedule as when your baby would breastfeed if you are returning to work or away from home for an extended period.  Drink plenty of fluids to avoid dehydration, which can eventually put you at greater risk of breast engorgement. If you follow these suggestions, your engorgement should improve in 24-48  hours. If you are still experiencing difficulty, call your lactation consultant or health care provider.  Challenge--Plugged Milk Ducts Plugged milk ducts occur when the duct does not drain milk  effectively and becomes swollen. Wearing a tight-fitting nursing bra or having difficulty with latching may cause plugged milk ducts. Not drinking enough water (8-10 c [1.9-2.4 L] per day) can contribute to plugged milk ducts. Once a duct has become plugged, hard lumps, soreness, and redness may develop in your breast.  Solution Do not delay feedings. Feed your baby frequently and try to empty your breasts of milk at each feeding. Try breastfeeding from the affected side first so there is a better chance that the milk will drain completely from that breast. Apply warm, moist towels to your breasts for 5-10 minutes before feeding. Alternatively, a hot shower right before breastfeeding can provide the moist heat that can encourage milk flow. Gentle massage of the sore area before and during a feeding may also help. Avoid wearing tight clothing or bras that put pressure on your breasts. Wear bras that offer good support to your breasts, but avoid underwire bras. If you have a plugged milk duct and develop a fever, you need to see your health care provider.  Challenge--Mastitis Mastitis is inflammation of your breast. It usually is caused by a bacterial infection and can cause flu-like symptoms. You may develop redness in your breast and a fever. Often when mastitis occurs, your breast becomes firm, warm, and very painful. The most common causes of mastitis are poor latching, ineffective sucking from your baby, consistent pressure on your breast (possibly from wearing a tight-fitting bra or shirt that restricts the milk flow), unusual stress or fatigue, or missed feedings.  Solution You will be given antibiotic medicine to treat the infection. It is still important to breastfeed frequently to empty your breasts.  Continuing to breastfeed while you recover from mastitis will not harm your baby. Make sure your baby is positioned properly during every feeding. Apply moist heat to your breasts for a few minutes before feeding to help the milk flow and to help your breasts empty more easily. Challenge--Thrush Ginette Pitman is a yeast infection that can form on your nipples, in your breast, or in your baby's mouth. It causes itching, soreness, burning or stabbing pain, and sometimes a rash.  Solution You will be given a medicated ointment for your nipples, and your baby will be given a liquid medicine for his or her mouth. It is important that you and your baby are treated at the same time because thrush can be passed between you and your baby. Change disposable nursing pads often. Any bras, towels, or clothing that come in contact with infected areas of your body or your baby's body need to be washed in very hot water every day. Wash your hands and your baby's hands often. All pacifiers, bottle nipples, or toys your baby puts in his or her mouth should be boiled once a day for 20 minutes. After 1 week of treatment, discard pacifiers and bottle nipples and buy new ones. All breast pump parts that touch the milk need to be boiled for 20 minutes every day. Challenge--Low Milk Supply You may not be producing enough milk if your baby is not gaining the proper amount of weight. Breast milk production is based on a supply-and-demand system. Your milk supply depends on how frequently and effectively your baby empties your breast. Solution The more you breastfeed and pump, the more breast milk you will produce. It is important that your baby empties at least one of your breasts at each feeding. If this is not happening, then use a breast pump or hand express  any milk that remains. This will help to drain as much milk as possible at each feeding. It will also signal your body to produce more milk. If your baby is not emptying your  breasts, it may be due to latching, sucking, or positioning problems. If low milk supply continues after addressing these issues, contact your health care provider or a lactation specialist as soon as possible. Challenge--Inverted or Flat Nipples Some women have nipples that turn inward instead of protruding outward. Other women have nipples that are flat. Inverted or flat nipples can sometimes make it more difficult for your baby to latch onto your breast. Solution You may be given a small device that pulls out inverted nipples. This device should be applied right before your baby is brought to your breast. You can also try using a breast pump for a short time before placing the baby at your breast. The pump can pull your nipple outwards to help your infant latch more easily. The baby's sucking motion will help the inverted nipple protrude as well.  If you have flat nipples, encourage your baby to latch onto your breast and feed frequently in the early days after birth. This will give your baby practice latching on correctly while your breast is still soft. When your milk supply increases, between the second and fifth day after birth and your breasts become full, your baby will have an easier time latching.  Contact a lactation consultant if you still have concerns. She or he can teach you additional techniques to address breastfeeding problems related to nipple shape and position.  FOR MORE INFORMATION [employer]: www.llli.org Document Released: 11/10/2005 Document Revised: 05/24/2013 Document Reviewed: 11/12/2012 [facility] Patient Information 2015 [place], [place]. This information is not intended to replace advice given to you by your health care provider. Make sure you discuss any questions you have with your health care provider.

## 2014-12-20 ENCOUNTER — Ambulatory Visit (INDEPENDENT_AMBULATORY_CARE_PROVIDER_SITE_OTHER): Payer: 59 | Admitting: Family Medicine

## 2014-12-20 VITALS — BP 131/72 | HR 90 | Temp 98.8°F | Wt 203.2 lb

## 2014-12-20 DIAGNOSIS — R81 Glycosuria: Secondary | ICD-10-CM

## 2014-12-20 DIAGNOSIS — Z3481 Encounter for supervision of other normal pregnancy, first trimester: Secondary | ICD-10-CM

## 2014-12-20 DIAGNOSIS — Z3491 Encounter for supervision of normal pregnancy, unspecified, first trimester: Secondary | ICD-10-CM

## 2014-12-20 LAB — POCT URINALYSIS DIP (DEVICE)
Bilirubin Urine: NEGATIVE
GLUCOSE, UA: 250 mg/dL — AB
HGB URINE DIPSTICK: NEGATIVE
Ketones, ur: NEGATIVE mg/dL
Leukocytes, UA: NEGATIVE
NITRITE: NEGATIVE
Protein, ur: 30 mg/dL — AB
Specific Gravity, Urine: 1.025 (ref 1.005–1.030)
Urobilinogen, UA: 0.2 mg/dL (ref 0.0–1.0)
pH: 6.5 (ref 5.0–8.0)

## 2014-12-20 LAB — GLUCOSE, CAPILLARY: GLUCOSE-CAPILLARY: 136 mg/dL — AB (ref 65–99)

## 2014-12-20 NOTE — Progress Notes (Signed)
Pain- left thigh  Edema- ankles

## 2014-12-20 NOTE — Progress Notes (Signed)
Subjective:  Nicole Brooks is a 29 y.o. G2P0010 at 2373w6d being seen today for ongoing prenatal care.  Patient reports no complaints.  Contractions: Irregular.  Vag. Bleeding: None. Movement: Present. Denies leaking of fluid.   The following portions of the patient's history were reviewed and updated as appropriate: allergies, current medications, past family history, past medical history, past social history, past surgical history and problem list.   Objective:   Filed Vitals:   12/20/14 0823  BP: 131/72  Pulse: 90  Temp: 98.8 F (37.1 C)  Weight: 203 lb 3.2 oz (92.171 kg)    Fetal Status: Fetal Heart Rate (bpm): 155   Movement: Present     General:  Alert, oriented and cooperative. Patient is in no acute distress.  Skin: Skin is warm and dry. No rash noted.   Cardiovascular: Normal heart rate noted  Respiratory: Normal respiratory effort, no problems with respiration noted  Abdomen: Soft, gravid, appropriate for gestational age. Pain/Pressure: Present     Vaginal: Vag. Bleeding: None.       Cervix: Not evaluated        Extremities: Normal range of motion.  Edema: Trace  Mental Status: Normal mood and affect. Normal behavior. Normal judgment and thought content.   Urinalysis: Urine Protein: 1+ Urine Glucose: 2+  Assessment and Plan:  Pregnancy: G2P0010 at 4173w6d  1. Supervision of normal pregnancy in first trimester Fundal height and FHT normal.  1hr GTT normal -  95  2. Glucosuria Checked CBG - 136.  She is about 1 hour postprandial Will continue to monitor  Preterm labor symptoms and general obstetric precautions including but not limited to vaginal bleeding, contractions, leaking of fluid and fetal movement were reviewed in detail with the patient. Please refer to After Visit Summary for other counseling recommendations.  No Follow-up on file.   Levie HeritageJacob J Stinson, DO

## 2014-12-20 NOTE — Patient Instructions (Signed)
Third Trimester of Pregnancy The third trimester is from week 29 through week 42, months 7 through 9. This trimester is when your unborn baby (fetus) is growing very fast. At the end of the ninth month, the unborn baby is about 20 inches in length. It weighs about 6-10 pounds.  HOME CARE   Avoid all smoking, herbs, and alcohol. Avoid drugs not approved by your doctor.  Only take medicine as told by your doctor. Some medicines are safe and some are not during pregnancy.  Exercise only as told by your doctor. Stop exercising if you start having cramps.  Eat regular, healthy meals.  Wear a good support bra if your breasts are tender.  Do not use hot tubs, steam rooms, or saunas.  Wear your seat belt when driving.  Avoid raw meat, uncooked cheese, and liter boxes and soil used by cats.  Take your prenatal vitamins.  Try taking medicine that helps you poop (stool softener) as needed, and if your doctor approves. Eat more fiber by eating fresh fruit, vegetables, and whole grains. Drink enough fluids to keep your pee (urine) clear or pale yellow.  Take warm water baths (sitz baths) to soothe pain or discomfort caused by hemorrhoids. Use hemorrhoid cream if your doctor approves.  If you have puffy, bulging veins (varicose veins), wear support hose. Raise (elevate) your feet for 15 minutes, 3-4 times a day. Limit salt in your diet.  Avoid heavy lifting, wear low heels, and sit up straight.  Rest with your legs raised if you have leg cramps or low back pain.  Visit your dentist if you have not gone during your pregnancy. Use a soft toothbrush to brush your teeth. Be gentle when you floss.  You can have sex (intercourse) unless your doctor tells you not to.  Do not travel far distances unless you must. Only do so with your doctor's approval.  Take prenatal classes.  Practice driving to the hospital.  Pack your hospital bag.  Prepare the baby's room.  Go to your doctor visits. GET  HELP IF:  You are not sure if you are in labor or if your water has broken.  You are dizzy.  You have mild cramps or pressure in your lower belly (abdominal).  You have a nagging pain in your belly area.  You continue to feel sick to your stomach (nauseous), throw up (vomit), or have watery poop (diarrhea).  You have bad smelling fluid coming from your vagina.  You have pain with peeing (urination). GET HELP RIGHT AWAY IF:   You have a fever.  You are leaking fluid from your vagina.  You are spotting or bleeding from your vagina.  You have severe belly cramping or pain.  You lose or gain weight rapidly.  You have trouble catching your breath and have chest pain.  You notice sudden or extreme puffiness (swelling) of your face, hands, ankles, feet, or legs.  You have not felt the baby move in over an hour.  You have severe headaches that do not go away with medicine.  You have vision changes. Document Released: 08/13/2009 Document Revised: 09/13/2012 Document Reviewed: 07/20/2012 Exit[employer] Patient Information 2015 [employer]. This information is not intended to replace advice given to you by your health care provider. Make sure you discuss any questions you have with your health care provider.  

## 2014-12-22 ENCOUNTER — Telehealth: Payer: Self-pay | Admitting: *Deleted

## 2014-12-22 NOTE — Telephone Encounter (Signed)
Pt returned call to the clinic, states FMLA is for delivery.  Informed patient that she needs to sign a ROI.  Pt states she will do so on her next appointment.

## 2014-12-22 NOTE — Telephone Encounter (Signed)
Attempted to contact patient to discuss FMLA paperwork.  Is the paperwork for delivery only and patient needs to sign ROI.

## 2015-01-03 ENCOUNTER — Ambulatory Visit (INDEPENDENT_AMBULATORY_CARE_PROVIDER_SITE_OTHER): Payer: 59 | Admitting: Advanced Practice Midwife

## 2015-01-03 ENCOUNTER — Encounter: Payer: Self-pay | Admitting: *Deleted

## 2015-01-03 VITALS — BP 109/71 | HR 91 | Temp 97.3°F | Wt 203.1 lb

## 2015-01-03 DIAGNOSIS — O9989 Other specified diseases and conditions complicating pregnancy, childbirth and the puerperium: Secondary | ICD-10-CM

## 2015-01-03 DIAGNOSIS — O26899 Other specified pregnancy related conditions, unspecified trimester: Secondary | ICD-10-CM | POA: Insufficient documentation

## 2015-01-03 DIAGNOSIS — O2603 Excessive weight gain in pregnancy, third trimester: Secondary | ICD-10-CM

## 2015-01-03 DIAGNOSIS — O2600 Excessive weight gain in pregnancy, unspecified trimester: Secondary | ICD-10-CM | POA: Insufficient documentation

## 2015-01-03 DIAGNOSIS — R81 Glycosuria: Secondary | ICD-10-CM

## 2015-01-03 LAB — POCT URINALYSIS DIP (DEVICE)
Bilirubin Urine: NEGATIVE
Glucose, UA: 100 mg/dL — AB
Hgb urine dipstick: NEGATIVE
Ketones, ur: NEGATIVE mg/dL
LEUKOCYTES UA: NEGATIVE
NITRITE: NEGATIVE
PH: 6.5 (ref 5.0–8.0)
Protein, ur: 30 mg/dL — AB
Urobilinogen, UA: 0.2 mg/dL (ref 0.0–1.0)

## 2015-01-03 NOTE — Patient Instructions (Addendum)
Braxton Hicks Contractions Contractions of the uterus can occur throughout pregnancy. Contractions are not always a sign that you are in labor.  WHAT ARE BRAXTON HICKS CONTRACTIONS?  Contractions that occur before labor are called Braxton Hicks contractions, or false labor. Toward the end of pregnancy (32-34 weeks), these contractions can develop more often and may become more forceful. This is not true labor because these contractions do not result in opening (dilatation) and thinning of the cervix. They are sometimes difficult to tell apart from true labor because these contractions can be forceful and people have different pain tolerances. You should not feel embarrassed if you go to the hospital with false labor. Sometimes, the only way to tell if you are in true labor is for your health care provider to look for changes in the cervix. If there are no prenatal problems or other health problems associated with the pregnancy, it is completely safe to be sent home with false labor and await the onset of true labor. HOW CAN YOU TELL THE DIFFERENCE BETWEEN TRUE AND FALSE LABOR? False Labor  The contractions of false labor are usually shorter and not as hard as those of true labor.   The contractions are usually irregular.   The contractions are often felt in the front of the lower abdomen and in the groin.   The contractions may go away when you walk around or change positions while lying down.   The contractions get weaker and are shorter lasting as time goes on.   The contractions do not usually become progressively stronger, regular, and closer together as with true labor.  True Labor  Contractions in true labor last 30-70 seconds, become very regular, usually become more intense, and increase in frequency.   The contractions do not go away with walking.   The discomfort is usually felt in the top of the uterus and spreads to the lower abdomen and low back.   True labor can be  determined by your health care provider with an exam. This will show that the cervix is dilating and getting thinner.  WHAT TO REMEMBER  Keep up with your usual exercises and follow other instructions given by your health care provider.   Take medicines as directed by your health care provider.   Keep your regular prenatal appointments.   Eat and drink lightly if you think you are going into labor.   If Braxton Hicks contractions are making you uncomfortable:   Change your position from lying down or resting to walking, or from walking to resting.   Sit and rest in a tub of warm water.   Drink 2-3 glasses of water. Dehydration may cause these contractions.   Do slow and deep breathing several times an hour.  WHEN SHOULD I SEEK IMMEDIATE MEDICAL CARE? Seek immediate medical care if:  Your contractions become stronger, more regular, and closer together.   You have fluid leaking or gushing from your vagina.   You have a fever.   You pass blood-tinged mucus.   You have vaginal bleeding.   You have continuous abdominal pain.   You have low back pain that you never had before.   You feel your baby's head pushing down and causing pelvic pressure.   Your baby is not moving as much as it used to.  Document Released: 05/19/2005 Document Revised: 05/24/2013 Document Reviewed: 02/28/2013 Exit[employer] Patient Information 2015 [employer]. This information is not intended to replace advice given to you by your health care   provider. Make sure you discuss any questions you have with your health care provider.  Fetal Movement Counts Patient Name: __________________________________________________ Patient Due Date: ____________________ Performing a fetal movement count is highly recommended in high-risk pregnancies, but it is good for every pregnant woman to do. Your health care provider may ask you to start counting fetal movements at 28 weeks of the pregnancy. Fetal  movements often increase:  After eating a full meal.  After physical activity.  After eating or drinking something sweet or cold.  At rest. Pay attention to when you feel the baby is most active. This will help you notice a pattern of your baby's sleep and wake cycles and what factors contribute to an increase in fetal movement. It is important to perform a fetal movement count at the same time each day when your baby is normally most active.  HOW TO COUNT FETAL MOVEMENTS  Find a quiet and comfortable area to sit or lie down on your left side. Lying on your left side provides the best blood and oxygen circulation to your baby.  Write down the day and time on a sheet of paper or in a journal.  Start counting kicks, flutters, swishes, rolls, or jabs in a 2-hour period. You should feel at least 10 movements within 2 hours.  If you do not feel 10 movements in 2 hours, wait 2-3 hours and count again. Look for a change in the pattern or not enough counts in 2 hours. SEEK MEDICAL CARE IF:  You feel less than 10 counts in 2 hours, tried twice.  There is no movement in over an hour.  The pattern is changing or taking longer each day to reach 10 counts in 2 hours.  You feel the baby is not moving as he or she usually does. Date: ____________ Movements: ____________ Start time: ____________ Doreatha Martin time: ____________  Date: ____________ Movements: ____________ Start time: ____________ Doreatha Martin time: ____________ Date: ____________ Movements: ____________ Start time: ____________ Doreatha Martin time: ____________ Date: ____________ Movements: ____________ Start time: ____________ Doreatha Martin time: ____________ Date: ____________ Movements: ____________ Start time: ____________ Doreatha Martin time: ____________ Date: ____________ Movements: ____________ Start time: ____________ Doreatha Martin time: ____________ Date: ____________ Movements: ____________ Start time: ____________ Doreatha Martin time: ____________ Date: ____________  Movements: ____________ Start time: ____________ Doreatha Martin time: ____________  Date: ____________ Movements: ____________ Start time: ____________ Doreatha Martin time: ____________ Date: ____________ Movements: ____________ Start time: ____________ Doreatha Martin time: ____________ Date: ____________ Movements: ____________ Start time: ____________ Doreatha Martin time: ____________ Date: ____________ Movements: ____________ Start time: ____________ Doreatha Martin time: ____________ Date: ____________ Movements: ____________ Start time: ____________ Doreatha Martin time: ____________ Date: ____________ Movements: ____________ Start time: ____________ Doreatha Martin time: ____________ Date: ____________ Movements: ____________ Start time: ____________ Doreatha Martin time: ____________  Date: ____________ Movements: ____________ Start time: ____________ Doreatha Martin time: ____________ Date: ____________ Movements: ____________ Start time: ____________ Doreatha Martin time: ____________ Date: ____________ Movements: ____________ Start time: ____________ Doreatha Martin time: ____________ Date: ____________ Movements: ____________ Start time: ____________ Doreatha Martin time: ____________ Date: ____________ Movements: ____________ Start time: ____________ Doreatha Martin time: ____________ Date: ____________ Movements: ____________ Start time: ____________ Doreatha Martin time: ____________ Date: ____________ Movements: ____________ Start time: ____________ Doreatha Martin time: ____________  Date: ____________ Movements: ____________ Start time: ____________ Doreatha Martin time: ____________ Date: ____________ Movements: ____________ Start time: ____________ Doreatha Martin time: ____________ Date: ____________ Movements: ____________ Start time: ____________ Doreatha Martin time: ____________ Date: ____________ Movements: ____________ Start time: ____________ Doreatha Martin time: ____________ Date: ____________ Movements: ____________ Start time: ____________ Doreatha Martin time: ____________ Date: ____________ Movements: ____________ Start time:  ____________ Doreatha Martin time: ____________ Date: ____________ Movements:  ____________ Start time: ____________ Doreatha Martin time: ____________  Date: ____________ Movements: ____________ Start time: ____________ Doreatha Martin time: ____________ Date: ____________ Movements: ____________ Start time: ____________ Doreatha Martin time: ____________ Date: ____________ Movements: ____________ Start time: ____________ Doreatha Martin time: ____________ Date: ____________ Movements: ____________ Start time: ____________ Doreatha Martin time: ____________ Date: ____________ Movements: ____________ Start time: ____________ Doreatha Martin time: ____________ Date: ____________ Movements: ____________ Start time: ____________ Doreatha Martin time: ____________ Date: ____________ Movements: ____________ Start time: ____________ Doreatha Martin time: ____________  Date: ____________ Movements: ____________ Start time: ____________ Doreatha Martin time: ____________ Date: ____________ Movements: ____________ Start time: ____________ Doreatha Martin time: ____________ Date: ____________ Movements: ____________ Start time: ____________ Doreatha Martin time: ____________ Date: ____________ Movements: ____________ Start time: ____________ Doreatha Martin time: ____________ Date: ____________ Movements: ____________ Start time: ____________ Doreatha Martin time: ____________ Date: ____________ Movements: ____________ Start time: ____________ Doreatha Martin time: ____________ Date: ____________ Movements: ____________ Start time: ____________ Doreatha Martin time: ____________  Date: ____________ Movements: ____________ Start time: ____________ Doreatha Martin time: ____________ Date: ____________ Movements: ____________ Start time: ____________ Doreatha Martin time: ____________ Date: ____________ Movements: ____________ Start time: ____________ Doreatha Martin time: ____________ Date: ____________ Movements: ____________ Start time: ____________ Doreatha Martin time: ____________ Date: ____________ Movements: ____________ Start time: ____________ Doreatha Martin time: ____________ Date:  ____________ Movements: ____________ Start time: ____________ Doreatha Martin time: ____________ Date: ____________ Movements: ____________ Start time: ____________ Doreatha Martin time: ____________  Date: ____________ Movements: ____________ Start time: ____________ Doreatha Martin time: ____________ Date: ____________ Movements: ____________ Start time: ____________ Doreatha Martin time: ____________ Date: ____________ Movements: ____________ Start time: ____________ Doreatha Martin time: ____________ Date: ____________ Movements: ____________ Start time: ____________ Doreatha Martin time: ____________ Date: ____________ Movements: ____________ Start time: ____________ Doreatha Martin time: ____________ Date: ____________ Movements: ____________ Start time: ____________ Doreatha Martin time: ____________ Document Released: 06/18/2006 Document Revised: 10/03/2013 Document Reviewed: 03/15/2012 Exit[employer] Patient Information 2015 [place] [employer]. This information is not intended to replace advice given to you by your health care provider. Make sure you discuss any questions you have with your health care provider.  Diabetes Mellitus and Food It is important for you to manage your blood sugar (glucose) level. Your blood glucose level can be greatly affected by what you eat. Eating healthier foods in the appropriate amounts throughout the day at about the same time each day will help you control your blood glucose level. It can also help slow or prevent worsening of your diabetes mellitus. Healthy eating may even help you improve the level of your blood pressure and reach or maintain a healthy weight.  HOW CAN FOOD AFFECT ME? Carbohydrates Carbohydrates affect your blood glucose level more than any other type of food. Your dietitian will help you determine how many carbohydrates to eat at each meal and teach you how to count carbohydrates. Counting carbohydrates is important to keep your blood glucose at a healthy level, especially if you are using insulin or taking certain  medicines for diabetes mellitus. Alcohol Alcohol can cause sudden decreases in blood glucose (hypoglycemia), especially if you use insulin or take certain medicines for diabetes mellitus. Hypoglycemia can be a life-threatening condition. Symptoms of hypoglycemia (sleepiness, dizziness, and disorientation) are similar to symptoms of having too much alcohol.  If your health care provider has given you approval to drink alcohol, do so in moderation and use the following guidelines:  Women should not have more than one drink per day, and men should not have more than two drinks per day. One drink is equal to:  12 oz of beer.  5 oz of wine.  1 oz of hard liquor.  Do not drink on an empty stomach.  Keep yourself  hydrated. Have water, diet soda, or unsweetened iced tea.  Regular soda, juice, and other mixers might contain a lot of carbohydrates and should be counted. WHAT FOODS ARE NOT RECOMMENDED? As you make food choices, it is important to remember that all foods are not the same. Some foods have fewer nutrients per serving than other foods, even though they might have the same number of calories or carbohydrates. It is difficult to get your body what it needs when you eat foods with fewer nutrients. Examples of foods that you should avoid that are high in calories and carbohydrates but low in nutrients include:  Trans fats (most processed foods list trans fats on the Nutrition Facts label).  Regular soda.  Juice.  Candy.  Sweets, such as cake, pie, doughnuts, and cookies.  Fried foods. WHAT FOODS CAN I EAT? Have nutrient-rich foods, which will nourish your body and keep you healthy. The food you should eat also will depend on several factors, including:  The calories you need.  The medicines you take.  Your weight.  Your blood glucose level.  Your blood pressure level.  Your cholesterol level. You also should eat a variety of foods, including:  Protein, such as meat,  poultry, fish, tofu, nuts, and seeds (lean animal proteins are best).  Fruits.  Vegetables.  Dairy products, such as milk, cheese, and yogurt (low fat is best).  Breads, grains, pasta, cereal, rice, and beans.  Fats such as olive oil, trans fat-free margarine, canola oil, avocado, and olives. DOES EVERYONE WITH DIABETES MELLITUS HAVE THE SAME MEAL PLAN? Because every person with diabetes mellitus is different, there is not one meal plan that works for everyone. It is very important that you meet with a dietitian who will help you create a meal plan that is just right for you. Document Released: 02/13/2005 Document Revised: 05/24/2013 Document Reviewed: 04/15/2013 [facility] Patient Information 2015 [place], [place]. This information is not intended to replace advice given to you by your health care provider. Make sure you discuss any questions you have with your health care provider.

## 2015-01-03 NOTE — Progress Notes (Signed)
Subjective:  Nicole Brooks is a 29 y.o. G2P0010 at [redacted]w[redacted]d being seen today for ongoing prenatal care.  Patient reports few braxton Hick's contractions..  Contractions: Irregular.  Vag. Bleeding: None. Movement: Present. Denies leaking of fluid.   The following portions of the patient's history were reviewed and updated as appropriate: allergies, current medications, past family history, past medical history, past social history, past surgical history and problem list.   Objective:   Filed Vitals:   01/03/15 0803  BP: 109/71  Pulse: 91  Temp: 97.3 F (36.3 C)  Weight: 203 lb 1.6 oz (92.126 kg)    Fetal Status: Fetal Heart Rate (bpm): 138 Fundal Height: 34 cm Movement: Present     General:  Alert, oriented and cooperative. Patient is in no acute distress.  Skin: Skin is warm and dry. No rash noted.   Cardiovascular: Normal heart rate noted  Respiratory: Normal respiratory effort, no problems with respiration noted  Abdomen: Soft, gravid, appropriate for gestational age. Pain/Pressure: Present     Vaginal: Vag. Bleeding: None.       Cervix: Not evaluated        Extremities: Normal range of motion.  Edema: Trace  Mental Status: Normal mood and affect. Normal behavior. Normal judgment and thought content.   Urinalysis: Urine Protein: Trace Urine Glucose: 1+  Assessment and Plan:  Pregnancy: G2P0010 at [redacted]w[redacted]d  Supervision on  Normal pregnancy Glycosuria again today.  Preterm labor symptoms and general obstetric precautions including but not limited to vaginal bleeding, contractions, leaking of fluid and fetal movement were reviewed in detail with the patient. Please refer to After Visit Summary for other counseling recommendations.  Return in about 2 weeks (around 01/17/2015). Rec diabetic diet. Watch fundal height.   Dorathy Kinsman, CNM

## 2015-01-11 ENCOUNTER — Encounter: Payer: Self-pay | Admitting: *Deleted

## 2015-01-11 ENCOUNTER — Telehealth: Payer: Self-pay | Admitting: *Deleted

## 2015-01-11 NOTE — Telephone Encounter (Signed)
Pt contacted the clinic stating she is experiencing cramps in her abdomen and request a callback from the nurse.  Contacted patient, encouraged patient to drink water and lay down on left side, if she notices 6 or more contractions in an hour, she may need to be evaluated in [facility].  Pt verbalizes understanding.

## 2015-01-11 NOTE — Progress Notes (Signed)
FMLA completed, needs ROI, note place on appointment note. Please fax when ROI collected.

## 2015-01-17 ENCOUNTER — Encounter: Payer: Self-pay | Admitting: Advanced Practice Midwife

## 2015-01-17 ENCOUNTER — Other Ambulatory Visit: Payer: Self-pay | Admitting: Advanced Practice Midwife

## 2015-01-17 ENCOUNTER — Ambulatory Visit (INDEPENDENT_AMBULATORY_CARE_PROVIDER_SITE_OTHER): Payer: 59 | Admitting: Advanced Practice Midwife

## 2015-01-17 VITALS — BP 122/68 | HR 90 | Wt 207.3 lb

## 2015-01-17 DIAGNOSIS — Z3491 Encounter for supervision of normal pregnancy, unspecified, first trimester: Secondary | ICD-10-CM

## 2015-01-17 DIAGNOSIS — Z3481 Encounter for supervision of other normal pregnancy, first trimester: Secondary | ICD-10-CM | POA: Diagnosis not present

## 2015-01-17 LAB — POCT URINALYSIS DIP (DEVICE)
Bilirubin Urine: NEGATIVE
Glucose, UA: NEGATIVE mg/dL
HGB URINE DIPSTICK: NEGATIVE
Ketones, ur: NEGATIVE mg/dL
Nitrite: NEGATIVE
PROTEIN: NEGATIVE mg/dL
Specific Gravity, Urine: 1.02 (ref 1.005–1.030)
UROBILINOGEN UA: 0.2 mg/dL (ref 0.0–1.0)
pH: 7 (ref 5.0–8.0)

## 2015-01-17 LAB — OB RESULTS CONSOLE GBS: GBS: NEGATIVE

## 2015-01-17 LAB — OB RESULTS CONSOLE GC/CHLAMYDIA
CHLAMYDIA, DNA PROBE: NEGATIVE
Gonorrhea: NEGATIVE

## 2015-01-17 NOTE — Progress Notes (Signed)
Subjective:  Nicole Brooks is a 29 y.o. G2P0010 at [redacted]w[redacted]d being seen today for ongoing prenatal care.  Patient has no complaints at this time.  Contractions: Not present.  Vag. Bleeding: None. Movement: Present. Denies leaking of fluid.   The following portions of the patient's history were reviewed and updated as appropriate: allergies, current medications, past family history, past medical history, past social history, past surgical history and problem list.   Objective:   Filed Vitals:   01/17/15 0911  BP: 122/68  Pulse: 90  Weight: 207 lb 4.8 oz (94.031 kg)    Fetal Status: Fetal Heart Rate (bpm): 145 Fundal Height: 35 cm Movement: Present     General:  Alert, oriented and cooperative. Patient is in no acute distress.  Skin: Skin is warm and dry. No rash noted.   Cardiovascular: Normal heart rate noted  Respiratory: Normal respiratory effort, no problems with respiration noted  Abdomen: Soft, gravid, appropriate for gestational age. Pain/Pressure: Absent     Pelvic: Vag. Bleeding: None     Cervical exam performed Dilation: Closed Effacement (%): 0 Station: -3  Extremities: Normal range of motion.  Edema: Trace  Mental Status: Normal mood and affect. Normal behavior. Normal judgment and thought content.   Urinalysis: Urine Protein: Negative Urine Glucose: Negative  Assessment and Plan:  Pregnancy: G2P0010 at [redacted]w[redacted]d  1. Supervision of normal pregnancy in first trimester  - Culture, beta strep (group b only) - GC/Chlamydia Probe Amp  Preterm labor symptoms and general obstetric precautions including but not limited to vaginal bleeding, contractions, leaking of fluid and fetal movement were reviewed in detail with the patient. Please refer to After Visit Summary for other counseling recommendations.  Patient to schedule follow up in 1 week.   Judeth Horn, NP

## 2015-01-17 NOTE — Patient Instructions (Addendum)
Fetal Movement Counts °Patient Name: __________________________________________________ Patient Due Date: ____________________ °Performing a fetal movement count is highly recommended in high-risk pregnancies, but it is good for every pregnant woman to do. Your health care provider may ask you to start counting fetal movements at 28 weeks of the pregnancy. Fetal movements often increase: °· After eating a full meal. °· After physical activity. °· After eating or drinking something sweet or cold. °· At rest. °Pay attention to when you feel the baby is most active. This will help you notice a pattern of your baby's sleep and wake cycles and what factors contribute to an increase in fetal movement. It is important to perform a fetal movement count at the same time each day when your baby is normally most active.  °HOW TO COUNT FETAL MOVEMENTS °· Find a quiet and comfortable area to sit or lie down on your left side. Lying on your left side provides the best blood and oxygen circulation to your baby. °· Write down the day and time on a sheet of paper or in a journal. °· Start counting kicks, flutters, swishes, rolls, or jabs in a 2-hour period. You should feel at least 10 movements within 2 hours. °· If you do not feel 10 movements in 2 hours, wait 2-3 hours and count again. Look for a change in the pattern or not enough counts in 2 hours. °SEEK MEDICAL CARE IF: °· You feel less than 10 counts in 2 hours, tried twice. °· There is no movement in over an hour. °· The pattern is changing or taking longer each day to reach 10 counts in 2 hours. °· You feel the baby is not moving as he or she usually does. °Date: ____________ Movements: ____________ Start time: ____________ Finish time: ____________  °Date: ____________ Movements: ____________ Start time: ____________ Finish time: ____________ °Date: ____________ Movements: ____________ Start time: ____________ Finish time: ____________ °Date: ____________ Movements:  ____________ Start time: ____________ Finish time: ____________ °Date: ____________ Movements: ____________ Start time: ____________ Finish time: ____________ °Date: ____________ Movements: ____________ Start time: ____________ Finish time: ____________ °Date: ____________ Movements: ____________ Start time: ____________ Finish time: ____________ °Date: ____________ Movements: ____________ Start time: ____________ Finish time: ____________  °Date: ____________ Movements: ____________ Start time: ____________ Finish time: ____________ °Date: ____________ Movements: ____________ Start time: ____________ Finish time: ____________ °Date: ____________ Movements: ____________ Start time: ____________ Finish time: ____________ °Date: ____________ Movements: ____________ Start time: ____________ Finish time: ____________ °Date: ____________ Movements: ____________ Start time: ____________ Finish time: ____________ °Date: ____________ Movements: ____________ Start time: ____________ Finish time: ____________ °Date: ____________ Movements: ____________ Start time: ____________ Finish time: ____________  °Date: ____________ Movements: ____________ Start time: ____________ Finish time: ____________ °Date: ____________ Movements: ____________ Start time: ____________ Finish time: ____________ °Date: ____________ Movements: ____________ Start time: ____________ Finish time: ____________ °Date: ____________ Movements: ____________ Start time: ____________ Finish time: ____________ °Date: ____________ Movements: ____________ Start time: ____________ Finish time: ____________ °Date: ____________ Movements: ____________ Start time: ____________ Finish time: ____________ °Date: ____________ Movements: ____________ Start time: ____________ Finish time: ____________  °Date: ____________ Movements: ____________ Start time: ____________ Finish time: ____________ °Date: ____________ Movements: ____________ Start time: ____________ Finish  time: ____________ °Date: ____________ Movements: ____________ Start time: ____________ Finish time: ____________ °Date: ____________ Movements: ____________ Start time: ____________ Finish time: ____________ °Date: ____________ Movements: ____________ Start time: ____________ Finish time: ____________ °Date: ____________ Movements: ____________ Start time: ____________ Finish time: ____________ °Date: ____________ Movements: ____________ Start time: ____________ Finish time: ____________  °Date: ____________ Movements: ____________ Start time: ____________ Finish   time: ____________ °Date: ____________ Movements: ____________ Start time: ____________ Finish time: ____________ °Date: ____________ Movements: ____________ Start time: ____________ Finish time: ____________ °Date: ____________ Movements: ____________ Start time: ____________ Finish time: ____________ °Date: ____________ Movements: ____________ Start time: ____________ Finish time: ____________ °Date: ____________ Movements: ____________ Start time: ____________ Finish time: ____________ °Date: ____________ Movements: ____________ Start time: ____________ Finish time: ____________  °Date: ____________ Movements: ____________ Start time: ____________ Finish time: ____________ °Date: ____________ Movements: ____________ Start time: ____________ Finish time: ____________ °Date: ____________ Movements: ____________ Start time: ____________ Finish time: ____________ °Date: ____________ Movements: ____________ Start time: ____________ Finish time: ____________ °Date: ____________ Movements: ____________ Start time: ____________ Finish time: ____________ °Date: ____________ Movements: ____________ Start time: ____________ Finish time: ____________ °Date: ____________ Movements: ____________ Start time: ____________ Finish time: ____________  °Date: ____________ Movements: ____________ Start time: ____________ Finish time: ____________ °Date: ____________  Movements: ____________ Start time: ____________ Finish time: ____________ °Date: ____________ Movements: ____________ Start time: ____________ Finish time: ____________ °Date: ____________ Movements: ____________ Start time: ____________ Finish time: ____________ °Date: ____________ Movements: ____________ Start time: ____________ Finish time: ____________ °Date: ____________ Movements: ____________ Start time: ____________ Finish time: ____________ °Date: ____________ Movements: ____________ Start time: ____________ Finish time: ____________  °Date: ____________ Movements: ____________ Start time: ____________ Finish time: ____________ °Date: ____________ Movements: ____________ Start time: ____________ Finish time: ____________ °Date: ____________ Movements: ____________ Start time: ____________ Finish time: ____________ °Date: ____________ Movements: ____________ Start time: ____________ Finish time: ____________ °Date: ____________ Movements: ____________ Start time: ____________ Finish time: ____________ °Date: ____________ Movements: ____________ Start time: ____________ Finish time: ____________ °Document Released: 06/18/2006 Document Revised: 10/03/2013 Document Reviewed: 03/15/2012 °[employer]® Patient Information ©2015 [employer]. This information is not intended to replace advice given to you by your health care provider. Make sure you discuss any questions you have with your health care provider. °Braxton Hicks Contractions °Contractions of the uterus can occur throughout pregnancy. Contractions are not always a sign that you are in labor.  °WHAT ARE BRAXTON HICKS CONTRACTIONS?  °Contractions that occur before labor are called Braxton Hicks contractions, or false labor. Toward the end of pregnancy (32-34 weeks), these contractions can develop more often and may become more forceful. This is not true labor because these contractions do not result in opening (dilatation) and thinning of the cervix. They  are sometimes difficult to tell apart from true labor because these contractions can be forceful and people have different pain tolerances. You should not feel embarrassed if you go to the hospital with false labor. Sometimes, the only way to tell if you are in true labor is for your health care provider to look for changes in the cervix. °If there are no prenatal problems or other health problems associated with the pregnancy, it is completely safe to be sent home with false labor and await the onset of true labor. °HOW CAN YOU TELL THE DIFFERENCE BETWEEN TRUE AND FALSE LABOR? °False Labor °· The contractions of false labor are usually shorter and not as hard as those of true labor.   °· The contractions are usually irregular.   °· The contractions are often felt in the front of the lower abdomen and in the groin.   °· The contractions may go away when you walk around or change positions while lying down.   °· The contractions get weaker and are shorter lasting as time goes on.   °· The contractions do not usually become progressively stronger, regular, and closer together as with true labor.   °True Labor °· Contractions in true labor last 30-70 seconds, become   very regular, usually become more intense, and increase in frequency.   °· The contractions do not go away with walking.   °· The discomfort is usually felt in the top of the uterus and spreads to the lower abdomen and low back.   °· True labor can be determined by your health care provider with an exam. This will show that the cervix is dilating and getting thinner.   °WHAT TO REMEMBER °· Keep up with your usual exercises and follow other instructions given by your health care provider.   °· Take medicines as directed by your health care provider.   °· Keep your regular prenatal appointments.   °· Eat and drink lightly if you think you are going into labor.   °· If Braxton Hicks contractions are making you uncomfortable:   °¨ Change your position from  lying down or resting to walking, or from walking to resting.   °¨ Sit and rest in a tub of warm water.   °¨ Drink 2-3 glasses of water. Dehydration may cause these contractions.   °¨ Do slow and deep breathing several times an hour.   °WHEN SHOULD I SEEK IMMEDIATE MEDICAL CARE? °Seek immediate medical care if: °· Your contractions become stronger, more regular, and closer together.   °· You have fluid leaking or gushing from your vagina.   °· You have a fever.   °· You pass blood-tinged mucus.   °· You have vaginal bleeding.   °· You have continuous abdominal pain.   °· You have low back pain that you never had before.   °· You feel your baby's head pushing down and causing pelvic pressure.   °· Your baby is not moving as much as it used to.   °Document Released: 05/19/2005 Document Revised: 05/24/2013 Document Reviewed: 02/28/2013 °[employer]® Patient Information ©2015 [employer]. This information is not intended to replace advice given to you by your health care provider. Make sure you discuss any questions you have with your health care provider. ° °

## 2015-01-18 LAB — GC/CHLAMYDIA PROBE AMP
CT PROBE, AMP APTIMA: NEGATIVE
GC PROBE AMP APTIMA: NEGATIVE

## 2015-01-19 LAB — CULTURE, BETA STREP (GROUP B ONLY)

## 2015-01-24 ENCOUNTER — Ambulatory Visit (INDEPENDENT_AMBULATORY_CARE_PROVIDER_SITE_OTHER): Payer: 59 | Admitting: Certified Nurse Midwife

## 2015-01-24 VITALS — BP 128/72 | HR 88 | Temp 98.9°F | Wt 207.6 lb

## 2015-01-24 DIAGNOSIS — Z3493 Encounter for supervision of normal pregnancy, unspecified, third trimester: Secondary | ICD-10-CM

## 2015-01-24 LAB — POCT URINALYSIS DIP (DEVICE)
Bilirubin Urine: NEGATIVE
Glucose, UA: NEGATIVE mg/dL
Ketones, ur: NEGATIVE mg/dL
Nitrite: NEGATIVE
Protein, ur: NEGATIVE mg/dL
Specific Gravity, Urine: 1.025 (ref 1.005–1.030)
Urobilinogen, UA: 0.2 mg/dL (ref 0.0–1.0)
pH: 6.5 (ref 5.0–8.0)

## 2015-01-24 NOTE — Progress Notes (Signed)
Subjective:  Nicole Brooks is a 29 y.o. G2P0010 at [redacted]w[redacted]d being seen today for ongoing prenatal care.  Patient reports occasional contractions.  Contractions: Irritability.  Vag. Bleeding: None. Movement: Present. Denies leaking of fluid.   The following portions of the patient's history were reviewed and updated as appropriate: allergies, current medications, past family history, past medical history, past social history, past surgical history and problem list.   Objective:   Filed Vitals:   01/24/15 0922  BP: 128/72  Pulse: 88  Temp: 98.9 F (37.2 C)  Weight: 207 lb 9.6 oz (94.167 kg)    Fetal Status: Fetal Heart Rate (bpm): 135   Movement: Present     General:  Alert, oriented and cooperative. Patient is in no acute distress.  Skin: Skin is warm and dry. No rash noted.   Cardiovascular: Normal heart rate noted  Respiratory: Normal respiratory effort, no problems with respiration noted  Abdomen: Soft, gravid, appropriate for gestational age. Pain/Pressure: Absent     Pelvic: Vag. Bleeding: None     Cervical exam deferred        Extremities: Normal range of motion.  Edema: Trace  Mental Status: Normal mood and affect. Normal behavior. Normal judgment and thought content.   Urinalysis: Urine Protein: Negative Urine Glucose: Negative  Assessment and Plan:  Pregnancy: G2P0010 at [redacted]w[redacted]d  There are no diagnoses linked to this encounter. Term labor symptoms and general obstetric precautions including but not limited to vaginal bleeding, contractions, leaking of fluid and fetal movement were reviewed in detail with the patient. Please refer to After Visit Summary for other counseling recommendations.  No Follow-up on file.   Rhea Pink, CNM

## 2015-01-24 NOTE — Patient Instructions (Signed)
Braxton Hicks Contractions °Contractions of the uterus can occur throughout pregnancy. Contractions are not always a sign that you are in labor.  °WHAT ARE BRAXTON HICKS CONTRACTIONS?  °Contractions that occur before labor are called Braxton Hicks contractions, or false labor. Toward the end of pregnancy (32-34 weeks), these contractions can develop more often and may become more forceful. This is not true labor because these contractions do not result in opening (dilatation) and thinning of the cervix. They are sometimes difficult to tell apart from true labor because these contractions can be forceful and people have different pain tolerances. You should not feel embarrassed if you go to the hospital with false labor. Sometimes, the only way to tell if you are in true labor is for your health care provider to look for changes in the cervix. °If there are no prenatal problems or other health problems associated with the pregnancy, it is completely safe to be sent home with false labor and await the onset of true labor. °HOW CAN YOU TELL THE DIFFERENCE BETWEEN TRUE AND FALSE LABOR? °False Labor °· The contractions of false labor are usually shorter and not as hard as those of true labor.   °· The contractions are usually irregular.   °· The contractions are often felt in the front of the lower abdomen and in the groin.   °· The contractions may go away when you walk around or change positions while lying down.   °· The contractions get weaker and are shorter lasting as time goes on.   °· The contractions do not usually become progressively stronger, regular, and closer together as with true labor.   °True Labor °· Contractions in true labor last 30-70 seconds, become very regular, usually become more intense, and increase in frequency.   °· The contractions do not go away with walking.   °· The discomfort is usually felt in the top of the uterus and spreads to the lower abdomen and low back.   °· True labor can be  determined by your health care provider with an exam. This will show that the cervix is dilating and getting thinner.   °WHAT TO REMEMBER °· Keep up with your usual exercises and follow other instructions given by your health care provider.   °· Take medicines as directed by your health care provider.   °· Keep your regular prenatal appointments.   °· Eat and drink lightly if you think you are going into labor.   °· If Braxton Hicks contractions are making you uncomfortable:   °¨ Change your position from lying down or resting to walking, or from walking to resting.   °¨ Sit and rest in a tub of warm water.   °¨ Drink 2-3 glasses of water. Dehydration may cause these contractions.   °¨ Do slow and deep breathing several times an hour.   °WHEN SHOULD I SEEK IMMEDIATE MEDICAL CARE? °Seek immediate medical care if: °· Your contractions become stronger, more regular, and closer together.   °· You have fluid leaking or gushing from your vagina.   °· You have a fever.   °· You pass blood-tinged mucus.   °· You have vaginal bleeding.   °· You have continuous abdominal pain.   °· You have low back pain that you never had before.   °· You feel your baby's head pushing down and causing pelvic pressure.   °· Your baby is not moving as much as it used to.   °Document Released: 05/19/2005 Document Revised: 05/24/2013 Document Reviewed: 02/28/2013 °[employer]® Patient Information ©2015 [employer]. This information is not intended to replace advice given to you by your health care   provider. Make sure you discuss any questions you have with your health care provider. ° °

## 2015-01-24 NOTE — Progress Notes (Signed)
Edema right ankle

## 2015-01-31 ENCOUNTER — Ambulatory Visit (INDEPENDENT_AMBULATORY_CARE_PROVIDER_SITE_OTHER): Payer: 59 | Admitting: Certified Nurse Midwife

## 2015-01-31 VITALS — BP 125/69 | HR 90 | Temp 98.6°F | Wt 210.3 lb

## 2015-01-31 DIAGNOSIS — Z23 Encounter for immunization: Secondary | ICD-10-CM | POA: Diagnosis not present

## 2015-01-31 DIAGNOSIS — Z3483 Encounter for supervision of other normal pregnancy, third trimester: Secondary | ICD-10-CM | POA: Diagnosis not present

## 2015-01-31 DIAGNOSIS — Z3493 Encounter for supervision of normal pregnancy, unspecified, third trimester: Secondary | ICD-10-CM

## 2015-01-31 LAB — POCT URINALYSIS DIP (DEVICE)
Bilirubin Urine: NEGATIVE
Glucose, UA: NEGATIVE mg/dL
Ketones, ur: NEGATIVE mg/dL
Nitrite: NEGATIVE
Protein, ur: 30 mg/dL — AB
Specific Gravity, Urine: 1.02 (ref 1.005–1.030)
Urobilinogen, UA: 0.2 mg/dL (ref 0.0–1.0)
pH: 7 (ref 5.0–8.0)

## 2015-01-31 NOTE — Progress Notes (Signed)
Moderate leukocytes noted on urinalysis. 

## 2015-01-31 NOTE — Progress Notes (Signed)
Subjective:  Nicole Brooks is a 29 y.o. G2P0010 at [redacted]w[redacted]d being seen today for ongoing prenatal care.  Patient reports no complaints.  Contractions: Irregular.  Vag. Bleeding: None. Movement: Present. Denies leaking of fluid.   The following portions of the patient's history were reviewed and updated as appropriate: allergies, current medications, past family history, past medical history, past social history, past surgical history and problem list.   Objective:   Filed Vitals:   01/31/15 0933  BP: 125/69  Pulse: 90  Temp: 98.6 F (37 C)  Weight: 210 lb 4.8 oz (95.391 kg)    Fetal Status: Fetal Heart Rate (bpm): 138 Fundal Height: 40 cm Movement: Present     General:  Alert, oriented and cooperative. Patient is in no acute distress.  Skin: Skin is warm and dry. No rash noted.   Cardiovascular: Normal heart rate noted  Respiratory: Normal respiratory effort, no problems with respiration noted  Abdomen: Soft, gravid, appropriate for gestational age. Pain/Pressure: Absent     Pelvic: Vag. Bleeding: None     Cervical exam deferred        Extremities: Normal range of motion.  Edema: None  Mental Status: Normal mood and affect. Normal behavior. Normal judgment and thought content.   Urinalysis: Urine Protein: 1+ Urine Glucose: Negative  Assessment and Plan:  Pregnancy: G2P0010 at [redacted]w[redacted]d  1. Supervision of normal pregnancy in third trimester  - Flu Vaccine QUAD 36+ mos IM; Standing - Flu Vaccine QUAD 36+ mos IM  Term labor symptoms and general obstetric precautions including but not limited to vaginal bleeding, contractions, leaking of fluid and fetal movement were reviewed in detail with the patient. Please refer to After Visit Summary for other counseling recommendations.  Return in about 1 week (around 02/07/2015).   Rhea Pink, CNM

## 2015-01-31 NOTE — Patient Instructions (Signed)
Third Trimester of Pregnancy The third trimester is from week 29 through week 42, months 7 through 9. The third trimester is a time when the fetus is growing rapidly. At the end of the ninth month, the fetus is about 20 inches in length and weighs 6-10 pounds.  BODY CHANGES Your body goes through many changes during pregnancy. The changes vary from woman to woman.   Your weight will continue to increase. You can expect to gain 25-35 pounds (11-16 kg) by the end of the pregnancy.  You may begin to get stretch marks on your hips, abdomen, and breasts.  You may urinate more often because the fetus is moving lower into your pelvis and pressing on your bladder.  You may develop or continue to have heartburn as a result of your pregnancy.  You may develop constipation because certain hormones are causing the muscles that push waste through your intestines to slow down.  You may develop hemorrhoids or swollen, bulging veins (varicose veins).  You may have pelvic pain because of the weight gain and pregnancy hormones relaxing your joints between the bones in your pelvis. Backaches may result from overexertion of the muscles supporting your posture.  You may have changes in your hair. These can include thickening of your hair, rapid growth, and changes in texture. Some women also have hair loss during or after pregnancy, or hair that feels dry or thin. Your hair will most likely return to normal after your baby is born.  Your breasts will continue to grow and be tender. A yellow discharge may leak from your breasts called colostrum.  Your belly button may stick out.  You may feel short of breath because of your expanding uterus.  You may notice the fetus "dropping," or moving lower in your abdomen.  You may have a bloody mucus discharge. This usually occurs a few days to a week before labor begins.  Your cervix becomes thin and soft (effaced) near your due date. WHAT TO EXPECT AT YOUR PRENATAL  EXAMS  You will have prenatal exams every 2 weeks until week 36. Then, you will have weekly prenatal exams. During a routine prenatal visit:  You will be weighed to make sure you and the fetus are growing normally.  Your blood pressure is taken.  Your abdomen will be measured to track your baby's growth.  The fetal heartbeat will be listened to.  Any test results from the previous visit will be discussed.  You may have a cervical check near your due date to see if you have effaced. At around 36 weeks, your caregiver will check your cervix. At the same time, your caregiver will also perform a test on the secretions of the vaginal tissue. This test is to determine if a type of bacteria, Group B streptococcus, is present. Your caregiver will explain this further. Your caregiver may ask you:  What your birth plan is.  How you are feeling.  If you are feeling the baby move.  If you have had any abnormal symptoms, such as leaking fluid, bleeding, severe headaches, or abdominal cramping.  If you have any questions. Other tests or screenings that may be performed during your third trimester include:  Blood tests that check for low iron levels (anemia).  Fetal testing to check the health, activity level, and growth of the fetus. Testing is done if you have certain medical conditions or if there are problems during the pregnancy. FALSE LABOR You may feel small, irregular contractions that   eventually go away. These are called Braxton Hicks contractions, or false labor. Contractions may last for hours, days, or even weeks before true labor sets in. If contractions come at regular intervals, intensify, or become painful, it is best to be seen by your caregiver.  SIGNS OF LABOR   Menstrual-like cramps.  Contractions that are 5 minutes apart or less.  Contractions that start on the top of the uterus and spread down to the lower abdomen and back.  A sense of increased pelvic pressure or back  pain.  A watery or bloody mucus discharge that comes from the vagina. If you have any of these signs before the 37th week of pregnancy, call your caregiver right away. You need to go to the hospital to get checked immediately. HOME CARE INSTRUCTIONS   Avoid all smoking, herbs, alcohol, and unprescribed drugs. These chemicals affect the formation and growth of the baby.  Follow your caregiver's instructions regarding medicine use. There are medicines that are either safe or unsafe to take during pregnancy.  Exercise only as directed by your caregiver. Experiencing uterine cramps is a good sign to stop exercising.  Continue to eat regular, healthy meals.  Wear a good support bra for breast tenderness.  Do not use hot tubs, steam rooms, or saunas.  Wear your seat belt at all times when driving.  Avoid raw meat, uncooked cheese, cat litter boxes, and soil used by cats. These carry germs that can cause birth defects in the baby.  Take your prenatal vitamins.  Try taking a stool softener (if your caregiver approves) if you develop constipation. Eat more high-fiber foods, such as fresh vegetables or fruit and whole grains. Drink plenty of fluids to keep your urine clear or pale yellow.  Take warm sitz baths to soothe any pain or discomfort caused by hemorrhoids. Use hemorrhoid cream if your caregiver approves.  If you develop varicose veins, wear support hose. Elevate your feet for 15 minutes, 3-4 times a day. Limit salt in your diet.  Avoid heavy lifting, wear low heal shoes, and practice good posture.  Rest a lot with your legs elevated if you have leg cramps or low back pain.  Visit your dentist if you have not gone during your pregnancy. Use a soft toothbrush to brush your teeth and be gentle when you floss.  A sexual relationship may be continued unless your caregiver directs you otherwise.  Do not travel far distances unless it is absolutely necessary and only with the approval  of your caregiver.  Take prenatal classes to understand, practice, and ask questions about the labor and delivery.  Make a trial run to the hospital.  Pack your hospital bag.  Prepare the baby's nursery.  Continue to go to all your prenatal visits as directed by your caregiver. SEEK MEDICAL CARE IF:  You are unsure if you are in labor or if your water has broken.  You have dizziness.  You have mild pelvic cramps, pelvic pressure, or nagging pain in your abdominal area.  You have persistent nausea, vomiting, or diarrhea.  You have a bad smelling vaginal discharge.  You have pain with urination. SEEK IMMEDIATE MEDICAL CARE IF:   You have a fever.  You are leaking fluid from your vagina.  You have spotting or bleeding from your vagina.  You have severe abdominal cramping or pain.  You have rapid weight loss or gain.  You have shortness of breath with chest pain.  You notice sudden or extreme swelling   of your face, hands, ankles, feet, or legs.  You have not felt your baby move in over an hour.  You have severe headaches that do not go away with medicine.  You have vision changes. Document Released: 05/13/2001 Document Revised: 05/24/2013 Document Reviewed: 07/20/2012 ExitCare Patient Information 2015 [employer]. This information is not intended to replace advice given to you by your health care provider. Make sure you discuss any questions you have with your health care provider.  

## 2015-02-06 ENCOUNTER — Encounter (HOSPITAL_COMMUNITY): Payer: Self-pay

## 2015-02-06 ENCOUNTER — Inpatient Hospital Stay (HOSPITAL_COMMUNITY)
Admission: AD | Admit: 2015-02-06 | Discharge: 2015-02-06 | Disposition: A | Discharge status: Home / Self Care | Payer: 59 | Source: Ambulatory Visit | Attending: Obstetrics & Gynecology | Admitting: Obstetrics & Gynecology

## 2015-02-06 DIAGNOSIS — Z3A34 34 weeks gestation of pregnancy: Secondary | ICD-10-CM | POA: Diagnosis not present

## 2015-02-06 DIAGNOSIS — O26893 Other specified pregnancy related conditions, third trimester: Secondary | ICD-10-CM | POA: Insufficient documentation

## 2015-02-06 DIAGNOSIS — R102 Pelvic and perineal pain: Secondary | ICD-10-CM | POA: Diagnosis not present

## 2015-02-06 DIAGNOSIS — Z3A38 38 weeks gestation of pregnancy: Secondary | ICD-10-CM | POA: Insufficient documentation

## 2015-02-06 DIAGNOSIS — N949 Unspecified condition associated with female genital organs and menstrual cycle: Secondary | ICD-10-CM | POA: Diagnosis not present

## 2015-02-06 DIAGNOSIS — R109 Unspecified abdominal pain: Secondary | ICD-10-CM | POA: Diagnosis present

## 2015-02-06 NOTE — Discharge Instructions (Signed)
Braxton Hicks Contractions °Contractions of the uterus can occur throughout pregnancy. Contractions are not always a sign that you are in labor.  °WHAT ARE BRAXTON HICKS CONTRACTIONS?  °Contractions that occur before labor are called Braxton Hicks contractions, or false labor. Toward the end of pregnancy (32-34 weeks), these contractions can develop more often and may become more forceful. This is not true labor because these contractions do not result in opening (dilatation) and thinning of the cervix. They are sometimes difficult to tell apart from true labor because these contractions can be forceful and people have different pain tolerances. You should not feel embarrassed if you go to the hospital with false labor. Sometimes, the only way to tell if you are in true labor is for your health care provider to look for changes in the cervix. °If there are no prenatal problems or other health problems associated with the pregnancy, it is completely safe to be sent home with false labor and await the onset of true labor. °HOW CAN YOU TELL THE DIFFERENCE BETWEEN TRUE AND FALSE LABOR? °False Labor °· The contractions of false labor are usually shorter and not as hard as those of true labor.   °· The contractions are usually irregular.   °· The contractions are often felt in the front of the lower abdomen and in the groin.   °· The contractions may go away when you walk around or change positions while lying down.   °· The contractions get weaker and are shorter lasting as time goes on.   °· The contractions do not usually become progressively stronger, regular, and closer together as with true labor.   °True Labor °1. Contractions in true labor last 30-70 seconds, become very regular, usually become more intense, and increase in frequency.   °2. The contractions do not go away with walking.   °3. The discomfort is usually felt in the top of the uterus and spreads to the lower abdomen and low back.   °4. True labor can  be determined by your health care provider with an exam. This will show that the cervix is dilating and getting thinner.   °WHAT TO REMEMBER °· Keep up with your usual exercises and follow other instructions given by your health care provider.   °· Take medicines as directed by your health care provider.   °· Keep your regular prenatal appointments.   °· Eat and drink lightly if you think you are going into labor.   °· If Braxton Hicks contractions are making you uncomfortable:   °· Change your position from lying down or resting to walking, or from walking to resting.   °· Sit and rest in a tub of warm water.   °· Drink 2-3 glasses of water. Dehydration may cause these contractions.   °· Do slow and deep breathing several times an hour.   °WHEN SHOULD I SEEK IMMEDIATE MEDICAL CARE? °Seek immediate medical care if: °· Your contractions become stronger, more regular, and closer together.   °· You have fluid leaking or gushing from your vagina.   °· You have a fever.   °· You pass blood-tinged mucus.   °· You have vaginal bleeding.   °· You have continuous abdominal pain.   °· You have low back pain that you never had before.   °· You feel your baby's head pushing down and causing pelvic pressure.   °· Your baby is not moving as much as it used to.   °Document Released: 05/19/2005 Document Revised: 05/24/2013 Document Reviewed: 02/28/2013 °Exit[employer]® Patient Information ©2015 [employer]. This information is not intended to replace advice given to you by your health care   provider. Make sure you discuss any questions you have with your health care provider. ° °Fetal Movement Counts °Patient Name: __________________________________________________ Patient Due Date: ____________________ °Performing a fetal movement count is highly recommended in high-risk pregnancies, but it is good for every pregnant woman to do. Your health care provider may ask you to start counting fetal movements at 28 weeks of the pregnancy. Fetal  movements often increase: °· After eating a full meal. °· After physical activity. °· After eating or drinking something sweet or cold. °· At rest. °Pay attention to when you feel the baby is most active. This will help you notice a pattern of your baby's sleep and wake cycles and what factors contribute to an increase in fetal movement. It is important to perform a fetal movement count at the same time each day when your baby is normally most active.  °HOW TO COUNT FETAL MOVEMENTS °5. Find a quiet and comfortable area to sit or lie down on your left side. Lying on your left side provides the best blood and oxygen circulation to your baby. °6. Write down the day and time on a sheet of paper or in a journal. °7. Start counting kicks, flutters, swishes, rolls, or jabs in a 2-hour period. You should feel at least 10 movements within 2 hours. °8. If you do not feel 10 movements in 2 hours, wait 2-3 hours and count again. Look for a change in the pattern or not enough counts in 2 hours. °SEEK MEDICAL CARE IF: °· You feel less than 10 counts in 2 hours, tried twice. °· There is no movement in over an hour. °· The pattern is changing or taking longer each day to reach 10 counts in 2 hours. °· You feel the baby is not moving as he or she usually does. °Date: ____________ Movements: ____________ Start time: ____________ Finish time: ____________  °Date: ____________ Movements: ____________ Start time: ____________ Finish time: ____________ °Date: ____________ Movements: ____________ Start time: ____________ Finish time: ____________ °Date: ____________ Movements: ____________ Start time: ____________ Finish time: ____________ °Date: ____________ Movements: ____________ Start time: ____________ Finish time: ____________ °Date: ____________ Movements: ____________ Start time: ____________ Finish time: ____________ °Date: ____________ Movements: ____________ Start time: ____________ Finish time: ____________ °Date: ____________  Movements: ____________ Start time: ____________ Finish time: ____________  °Date: ____________ Movements: ____________ Start time: ____________ Finish time: ____________ °Date: ____________ Movements: ____________ Start time: ____________ Finish time: ____________ °Date: ____________ Movements: ____________ Start time: ____________ Finish time: ____________ °Date: ____________ Movements: ____________ Start time: ____________ Finish time: ____________ °Date: ____________ Movements: ____________ Start time: ____________ Finish time: ____________ °Date: ____________ Movements: ____________ Start time: ____________ Finish time: ____________ °Date: ____________ Movements: ____________ Start time: ____________ Finish time: ____________  °Date: ____________ Movements: ____________ Start time: ____________ Finish time: ____________ °Date: ____________ Movements: ____________ Start time: ____________ Finish time: ____________ °Date: ____________ Movements: ____________ Start time: ____________ Finish time: ____________ °Date: ____________ Movements: ____________ Start time: ____________ Finish time: ____________ °Date: ____________ Movements: ____________ Start time: ____________ Finish time: ____________ °Date: ____________ Movements: ____________ Start time: ____________ Finish time: ____________ °Date: ____________ Movements: ____________ Start time: ____________ Finish time: ____________  °Date: ____________ Movements: ____________ Start time: ____________ Finish time: ____________ °Date: ____________ Movements: ____________ Start time: ____________ Finish time: ____________ °Date: ____________ Movements: ____________ Start time: ____________ Finish time: ____________ °Date: ____________ Movements: ____________ Start time: ____________ Finish time: ____________ °Date: ____________ Movements: ____________ Start time: ____________ Finish time: ____________ °Date: ____________ Movements: ____________ Start time:  ____________ Finish time: ____________ °Date: ____________ Movements:   ____________ Start time: ____________ Finish time: ____________  °Date: ____________ Movements: ____________ Start time: ____________ Finish time: ____________ °Date: ____________ Movements: ____________ Start time: ____________ Finish time: ____________ °Date: ____________ Movements: ____________ Start time: ____________ Finish time: ____________ °Date: ____________ Movements: ____________ Start time: ____________ Finish time: ____________ °Date: ____________ Movements: ____________ Start time: ____________ Finish time: ____________ °Date: ____________ Movements: ____________ Start time: ____________ Finish time: ____________ °Date: ____________ Movements: ____________ Start time: ____________ Finish time: ____________  °Date: ____________ Movements: ____________ Start time: ____________ Finish time: ____________ °Date: ____________ Movements: ____________ Start time: ____________ Finish time: ____________ °Date: ____________ Movements: ____________ Start time: ____________ Finish time: ____________ °Date: ____________ Movements: ____________ Start time: ____________ Finish time: ____________ °Date: ____________ Movements: ____________ Start time: ____________ Finish time: ____________ °Date: ____________ Movements: ____________ Start time: ____________ Finish time: ____________ °Date: ____________ Movements: ____________ Start time: ____________ Finish time: ____________  °Date: ____________ Movements: ____________ Start time: ____________ Finish time: ____________ °Date: ____________ Movements: ____________ Start time: ____________ Finish time: ____________ °Date: ____________ Movements: ____________ Start time: ____________ Finish time: ____________ °Date: ____________ Movements: ____________ Start time: ____________ Finish time: ____________ °Date: ____________ Movements: ____________ Start time: ____________ Finish time: ____________ °Date:  ____________ Movements: ____________ Start time: ____________ Finish time: ____________ °Date: ____________ Movements: ____________ Start time: ____________ Finish time: ____________  °Date: ____________ Movements: ____________ Start time: ____________ Finish time: ____________ °Date: ____________ Movements: ____________ Start time: ____________ Finish time: ____________ °Date: ____________ Movements: ____________ Start time: ____________ Finish time: ____________ °Date: ____________ Movements: ____________ Start time: ____________ Finish time: ____________ °Date: ____________ Movements: ____________ Start time: ____________ Finish time: ____________ °Date: ____________ Movements: ____________ Start time: ____________ Finish time: ____________ °Document Released: 06/18/2006 Document Revised: 10/03/2013 Document Reviewed: 03/15/2012 °ExitCare® Patient Information ©2015 [employer]. This information is not intended to replace advice given to you by your health care provider. Make sure you discuss any questions you have with your health care provider. ° °

## 2015-02-06 NOTE — MAU Provider Note (Signed)
Chief Complaint:  Abdominal Pain and Back Pain   First Provider Initiated Contact with Patient 02/06/15 1530     HPI: Nicole Brooks is a 29 y.o. G2P0010 at [redacted]w[redacted]d who presents to maternity admissions reporting increased pelvic pressure and possible contractions today.  Location: pelvis Quality: pressure Severity: 0/10 in pain scale Duration: >1 day Context: constant Timing: intermittent Modifying factors: none Associated signs and symptoms: no associated urinary complaints, leakage of fluid or vaginal bleeding. Good fetal movement.   Patient Active Problem List   Diagnosis Date Noted  . Excess weight gain in pregnancy 01/03/2015  . Pregnancy-related glycosuria 01/03/2015  . History of cardiomegaly 08/29/2014  . Supervision of normal pregnancy in first trimester 08/01/2014   Past Medical History: Past Medical History  Diagnosis Date  . Asthma   . Cardiomegaly     Past obstetric history: OB History  Gravida Para Term Preterm AB SAB TAB Ectopic Multiple Living  2 0 0 0 1 1 0 0 0 0     # Outcome Date GA Lbr Len/2nd Weight Sex Delivery Anes PTL Lv  2 Current           1 SAB 2013              Past Surgical History: Past Surgical History  Procedure Laterality Date  . Dilation and evacuation  05/17/2012    Procedure: DILATATION AND EVACUATION;  Surgeon: Willodean Rosenthal, MD;  Location: [facility];  Service: Gynecology;  Laterality: N/A;  . Dilation and evacuation  05/17/2012     Family History: Family History  Problem Relation Age of Onset  . Diabetes Mother   . Hypertension Mother     Social History: Social History  Substance Use Topics  . Smoking status: Never Smoker   . Smokeless tobacco: Never Used  . Alcohol Use: No    Allergies: No Known Allergies  Meds:  Prescriptions prior to admission  Medication Sig Dispense Refill Last Dose  . oxyCODONE-acetaminophen (PERCOCET/ROXICET) 5-325 MG per tablet Take 1 tablet by mouth every 4 (four) hours as needed for  severe pain. 6 tablet 0 Taking  . prenatal vitamin w/FE, FA (PRENATAL 1 + 1) 27-1 MG TABS tablet Take 1 tablet by mouth daily at 12 noon.   Taking    I have reviewed patient's Past Medical Hx, Surgical Hx, Family Hx, Social Hx, medications and allergies.   ROS:  Review of Systems  Constitutional: Negative for fever and chills.  Genitourinary: Negative for dysuria, urgency, hematuria and vaginal bleeding.       Neg for LOF. Pos FM, pelvic pressure    Physical Exam  Patient Vitals for the past 24 hrs:  BP Temp Temp src Pulse Resp SpO2  02/06/15 1409 119/74 mmHg - - - - -  02/06/15 1407 133/78 mmHg 99.1 F (37.3 C) Oral 90 18 100 %   Constitutional: Well-developed, well-nourished female in no acute distress.  Cardiovascular: normal rate Respiratory: normal effort GI: Abd soft, non-tender, gravid appropriate for gestational age.  MS: Extremities nontender, 2+ edema, normal ROM Neurologic: Alert and oriented x 4.  GU: Neg CVAT.  Pelvic: NEFG, physiologic discharge, no blood, cervix clean. No CMT  Dilation: 1 Effacement (%): Thick Cervical Position: Posterior Station: -3 Presentation: Vertex Exam by:: [place] CNM   FHT:  Baseline 135 , moderate variability, accelerations present, no decelerations Contractions: irreg, mild   Labs: No results found for this or any previous visit (from the past 24 hour(s)).  Imaging:  No  results found.  MAU Course: NST reactive.   MDM: Pressure normal for third trimester. No evidence of active labor.   Assessment: 1. Pelvic pressure in pregnancy, antepartum, third trimester    Plan: Discharge home in stable condition.  Labor precautions and fetal kick counts Follow-up Information    Follow up with [facility] On 02/07/2015.   Specialty:  Obstetrics and Gynecology   Why:  Routine prenatal visit   Contact information:   [address] [place] [address] [phone]      Follow up with THE  [facility] [place] [facility].   Why:  As needed in emergencies   Contact information:   [address] [id number] mc [place] [address] [phone]        Medication List    TAKE these medications        oxyCODONE-acetaminophen 5-325 MG per tablet  Commonly known as:  PERCOCET/ROXICET  Take 1 tablet by mouth every 4 (four) hours as needed for severe pain.     prenatal vitamin w/FE, FA 27-1 MG Tabs tablet  Take 1 tablet by mouth daily at 12 noon.       [place], CNM 02/06/2015 3:30 PM

## 2015-02-06 NOTE — MAU Note (Signed)
Lower back pain since yesterday, also having abd pain - doesn't think it's contractions.  Denies bleeding or LOF.  Reports good FM.

## 2015-02-07 ENCOUNTER — Encounter: Payer: Self-pay | Admitting: Obstetrics and Gynecology

## 2015-02-07 ENCOUNTER — Ambulatory Visit (INDEPENDENT_AMBULATORY_CARE_PROVIDER_SITE_OTHER): Payer: 59 | Admitting: Obstetrics and Gynecology

## 2015-02-07 VITALS — BP 126/72 | HR 82 | Temp 99.0°F | Wt 210.5 lb

## 2015-02-07 DIAGNOSIS — Z789 Other specified health status: Secondary | ICD-10-CM

## 2015-02-07 DIAGNOSIS — Z3481 Encounter for supervision of other normal pregnancy, first trimester: Secondary | ICD-10-CM | POA: Diagnosis not present

## 2015-02-07 DIAGNOSIS — Z3491 Encounter for supervision of normal pregnancy, unspecified, first trimester: Secondary | ICD-10-CM

## 2015-02-07 DIAGNOSIS — Z87898 Personal history of other specified conditions: Secondary | ICD-10-CM

## 2015-02-07 LAB — POCT URINALYSIS DIP (DEVICE)
Bilirubin Urine: NEGATIVE
GLUCOSE, UA: NEGATIVE mg/dL
Hgb urine dipstick: NEGATIVE
Ketones, ur: NEGATIVE mg/dL
Leukocytes, UA: NEGATIVE
Nitrite: NEGATIVE
PROTEIN: NEGATIVE mg/dL
Specific Gravity, Urine: 1.02 (ref 1.005–1.030)
UROBILINOGEN UA: 0.2 mg/dL (ref 0.0–1.0)
pH: 7 (ref 5.0–8.0)

## 2015-02-07 NOTE — Progress Notes (Signed)
Edema- feet and ankles  Pt reports going to MAU yesterday for lower back/pelvic pain; pt requests to be released from work

## 2015-02-07 NOTE — Patient Instructions (Signed)
To whom it may concern:  Please excuse Nicole Brooks from work for the remainder of her pregnancy.  Sincerely,  Shonna Chock, MD

## 2015-02-07 NOTE — Progress Notes (Signed)
Subjective:  Nicole Brooks is a 29 y.o. G2P0010 at [redacted]w[redacted]d being seen today for ongoing prenatal care.  Patient reports no complaints.  Contractions: Irregular.  Vag. Bleeding: None. Movement: Present. Denies leaking of fluid.   The following portions of the patient's history were reviewed and updated as appropriate: allergies, current medications, past family history, past medical history, past social history, past surgical history and problem list.   Objective:   Filed Vitals:   02/07/15 1254  BP: 126/72  Pulse: 82  Temp: 99 F (37.2 C)  Weight: 210 lb 8 oz (95.482 kg)    Fetal Status: Fetal Heart Rate (bpm): 147   Movement: Present     General:  Alert, oriented and cooperative. Patient is in no acute distress.  Skin: Skin is warm and dry. No rash noted.   Cardiovascular: Normal heart rate noted  Respiratory: Normal respiratory effort, no problems with respiration noted  Abdomen: Soft, gravid, appropriate for gestational age. Pain/Pressure: Present     Pelvic: Vag. Bleeding: None     Cervical exam deferred        Extremities: Normal range of motion.  Edema: Mild pitting, slight indentation  Mental Status: Normal mood and affect. Normal behavior. Normal judgment and thought content.   Urinalysis:      Assessment and Plan:  Pregnancy: G2P0010 at [redacted]w[redacted]d  1. Supervision of normal pregnancy in first trimester Gbs, gc, and chlamydia negative  2. Not immune to rubella Vaccinate PP  Term labor symptoms and general obstetric precautions including but not limited to vaginal bleeding, contractions, leaking of fluid and fetal movement were reviewed in detail with the patient. Please refer to After Visit Summary for other counseling recommendations.  Return in about 1 week (around 02/14/2015).   Kathrynn Running, MD

## 2015-02-12 ENCOUNTER — Telehealth: Payer: Self-pay | Admitting: *Deleted

## 2015-02-12 NOTE — Telephone Encounter (Signed)
Patient left message stating that she would like a nurse to call her back about getting induced this week.

## 2015-02-13 NOTE — Telephone Encounter (Signed)
Called patient and discussed that we do not do inductions prior to 41 weeks but if she would like, we can check her at her appointment tomorrow to see if she is dilated and possibly strip her membranes. Told patient sometimes that can be helpful. Patient verbalized understanding and had no other questions

## 2015-02-14 ENCOUNTER — Ambulatory Visit (INDEPENDENT_AMBULATORY_CARE_PROVIDER_SITE_OTHER): Payer: 59 | Admitting: Family Medicine

## 2015-02-14 VITALS — BP 131/72 | HR 90 | Temp 98.7°F | Wt 211.0 lb

## 2015-02-14 DIAGNOSIS — O99212 Obesity complicating pregnancy, second trimester: Secondary | ICD-10-CM

## 2015-02-14 DIAGNOSIS — Z789 Other specified health status: Secondary | ICD-10-CM

## 2015-02-14 DIAGNOSIS — E669 Obesity, unspecified: Secondary | ICD-10-CM

## 2015-02-14 DIAGNOSIS — O99213 Obesity complicating pregnancy, third trimester: Secondary | ICD-10-CM

## 2015-02-14 DIAGNOSIS — Z3483 Encounter for supervision of other normal pregnancy, third trimester: Secondary | ICD-10-CM

## 2015-02-14 DIAGNOSIS — Z3491 Encounter for supervision of normal pregnancy, unspecified, first trimester: Secondary | ICD-10-CM

## 2015-02-14 DIAGNOSIS — Z87898 Personal history of other specified conditions: Secondary | ICD-10-CM

## 2015-02-14 DIAGNOSIS — O2603 Excessive weight gain in pregnancy, third trimester: Secondary | ICD-10-CM

## 2015-02-14 LAB — POCT URINALYSIS DIP (DEVICE)
BILIRUBIN URINE: NEGATIVE
Glucose, UA: NEGATIVE mg/dL
Ketones, ur: NEGATIVE mg/dL
NITRITE: NEGATIVE
PH: 7 (ref 5.0–8.0)
Protein, ur: NEGATIVE mg/dL
Specific Gravity, Urine: 1.015 (ref 1.005–1.030)
UROBILINOGEN UA: 0.2 mg/dL (ref 0.0–1.0)

## 2015-02-14 NOTE — Patient Instructions (Signed)
Labor Induction  Labor induction is when steps are taken to cause a pregnant woman to begin the labor process. Most women go into labor on their own between 37 weeks and 42 weeks of the pregnancy. When this does not happen or when there is a medical need, methods may be used to induce labor. Labor induction causes a pregnant woman's uterus to contract. It also causes the cervix to soften (ripen), open (dilate), and thin out (efface). Usually, labor is not induced before 39 weeks of the pregnancy unless there is a problem with the baby or mother.  Before inducing labor, your health care provider will consider a number of factors, including the following:  The medical condition of you and the baby.   How many weeks along you are.   The status of the baby's lung maturity.   The condition of the cervix.   The position of the baby.  WHAT ARE THE REASONS FOR LABOR INDUCTION? Labor may be induced for the following reasons:  The health of the baby or mother is at risk.   The pregnancy is overdue by 1 week or more.   The water breaks but labor does not start on its own.   The mother has a health condition or serious illness, such as high blood pressure, infection, placental abruption, or diabetes.  The amniotic fluid amounts are low around the baby.   The baby is distressed.  Convenience or wanting the baby to be born on a certain date is not a reason for inducing labor. WHAT METHODS ARE USED FOR LABOR INDUCTION? Several methods of labor induction may be used, such as:   Prostaglandin medicine. This medicine causes the cervix to dilate and ripen. The medicine will also start contractions. It can be taken by mouth or by inserting a suppository into the vagina.   Inserting a thin tube (catheter) with a balloon on the end into the vagina to dilate the cervix. Once inserted, the balloon is expanded with water, which causes the cervix to open.   Stripping the membranes. Your health  care provider separates amniotic sac tissue from the cervix, causing the cervix to be stretched and causing the release of a hormone called progesterone. This may cause the uterus to contract. It is often done during an office visit. You will be sent home to wait for the contractions to begin. You will then come in for an induction.   Breaking the water. Your health care provider makes a hole in the amniotic sac using a small instrument. Once the amniotic sac breaks, contractions should begin. This may still take hours to see an effect.   Medicine to trigger or strengthen contractions. This medicine is given through an IV access tube inserted into a vein in your arm.  All of the methods of induction, besides stripping the membranes, will be done in the hospital. Induction is done in the hospital so that you and the baby can be carefully monitored.  HOW LONG DOES IT TAKE FOR LABOR TO BE INDUCED? Some inductions can take up to 2-3 days. Depending on the cervix, it usually takes less time. It takes longer when you are induced early in the pregnancy or if this is your first pregnancy. If a mother is still pregnant and the induction has been going on for 2-3 days, either the mother will be sent home or a cesarean delivery will be needed. WHAT ARE THE RISKS ASSOCIATED WITH LABOR INDUCTION? Some of the risks of induction   include:   Changes in fetal heart rate, such as too high, too low, or erratic.   Fetal distress.   Chance of infection for the mother and baby.   Increased chance of having a cesarean delivery.   Breaking off (abruption) of the placenta from the uterus (rare).   Uterine rupture (very rare).  When induction is needed for medical reasons, the benefits of induction may outweigh the risks. WHAT ARE SOME REASONS FOR NOT INDUCING LABOR? Labor induction should not be done if:   It is shown that your baby does not tolerate labor.   You have had previous surgeries on your  uterus, such as a myomectomy or the removal of fibroids.   Your placenta lies very low in the uterus and blocks the opening of the cervix (placenta previa).   Your baby is not in a head-down position.   The umbilical cord drops down into the birth canal in front of the baby. This could cut off the baby's blood and oxygen supply.   You have had a previous cesarean delivery.   There are unusual circumstances, such as the baby being extremely premature.  Document Released: 10/08/2006 Document Revised: 01/19/2013 Document Reviewed: 12/16/2012 Exit[employer] Patient Information 2015 [employer]. This information is not intended to replace advice given to you by your health care provider. Make sure you discuss any questions you have with your health care provider.  

## 2015-02-14 NOTE — Progress Notes (Signed)
Breastfeeding tip of the week reviewed. 

## 2015-02-14 NOTE — Progress Notes (Signed)
Subjective:  Nicole Brooks is a 29 y.o. G2P0010 at [redacted]w[redacted]d being seen today for ongoing prenatal care.  Patient reports pelvic pressure.  Contractions: Irregular.  Vag. Bleeding: None. Movement: Present. Denies leaking of fluid.   The following portions of the patient's history were reviewed and updated as appropriate: allergies, current medications, past family history, past medical history, past social history, past surgical history and problem list.   Objective:   Filed Vitals:   02/14/15 1113  BP: 131/72  Pulse: 90  Temp: 98.7 F (37.1 C)  Weight: 211 lb (95.709 kg)    Fetal Status: Fetal Heart Rate (bpm): 132   Movement: Present     General:  Alert, oriented and cooperative. Patient is in no acute distress.  Skin: Skin is warm and dry. No rash noted.   Cardiovascular: Normal heart rate noted  Respiratory: Normal respiratory effort, no problems with respiration noted  Abdomen: Soft, gravid, appropriate for gestational age. Pain/Pressure: Present     Pelvic: Vag. Bleeding: None     Cervical exam performed        Extremities: Normal range of motion.  Edema: Mild pitting, slight indentation  Mental Status: Normal mood and affect. Normal behavior. Normal judgment and thought content.   Urinalysis: Urine Protein: Negative Urine Glucose: Negative  Assessment and Plan:  Pregnancy: G2P0010 at [redacted]w[redacted]d  1. Supervision of normal pregnancy in first trimester Unable to sweep today Return Monday for AFI/NST Scheduled IOL 9/22 or 9/23   2. Obesity complicating pregnancy in second trimester Fundal height appropriate  3. Not immune to rubella PP MMR  Term labor symptoms and general obstetric precautions including but not limited to vaginal bleeding, contractions, leaking of fluid and fetal movement were reviewed in detail with the patient. Please refer to After Visit Summary for other counseling recommendations.  Return in about 4 days (around 02/18/2015) for Routine prenatal care  (Monday) 9/19.   Caren Macadam, MD

## 2015-02-15 ENCOUNTER — Telehealth: Payer: Self-pay

## 2015-02-15 NOTE — Telephone Encounter (Signed)
Pt called and wanted to know what time is her induction next Thursday.

## 2015-02-16 NOTE — Telephone Encounter (Signed)
Induction scheduled for 9/22 @ 0001. Pt to arrive at 11:45pm on 02/21/15.   Attempted to contact patient, no answer, left message with date/time for induction.  Will discuss patient on Monday at NST appointment.

## 2015-02-18 ENCOUNTER — Inpatient Hospital Stay (HOSPITAL_COMMUNITY)
Admission: AD | Admit: 2015-02-18 | Discharge: 2015-02-22 | DRG: 765 | Disposition: A | Discharge status: Home / Self Care | Payer: 59 | Source: Ambulatory Visit | Attending: Family Medicine | Admitting: Family Medicine

## 2015-02-18 ENCOUNTER — Encounter (HOSPITAL_COMMUNITY): Payer: Self-pay

## 2015-02-18 DIAGNOSIS — Z8249 Family history of ischemic heart disease and other diseases of the circulatory system: Secondary | ICD-10-CM

## 2015-02-18 DIAGNOSIS — O99214 Obesity complicating childbirth: Secondary | ICD-10-CM | POA: Diagnosis present

## 2015-02-18 DIAGNOSIS — IMO0001 Reserved for inherently not codable concepts without codable children: Secondary | ICD-10-CM

## 2015-02-18 DIAGNOSIS — Z3A40 40 weeks gestation of pregnancy: Secondary | ICD-10-CM | POA: Diagnosis present

## 2015-02-18 DIAGNOSIS — O26899 Other specified pregnancy related conditions, unspecified trimester: Secondary | ICD-10-CM

## 2015-02-18 DIAGNOSIS — J45909 Unspecified asthma, uncomplicated: Secondary | ICD-10-CM | POA: Diagnosis present

## 2015-02-18 DIAGNOSIS — Z6841 Body Mass Index (BMI) 40.0 and over, adult: Secondary | ICD-10-CM

## 2015-02-18 DIAGNOSIS — O2603 Excessive weight gain in pregnancy, third trimester: Secondary | ICD-10-CM | POA: Diagnosis present

## 2015-02-18 DIAGNOSIS — Z833 Family history of diabetes mellitus: Secondary | ICD-10-CM

## 2015-02-18 DIAGNOSIS — O9952 Diseases of the respiratory system complicating childbirth: Secondary | ICD-10-CM | POA: Diagnosis present

## 2015-02-18 DIAGNOSIS — R81 Glycosuria: Secondary | ICD-10-CM

## 2015-02-18 DIAGNOSIS — Z98891 History of uterine scar from previous surgery: Secondary | ICD-10-CM

## 2015-02-18 NOTE — MAU Note (Signed)
Pt states she has been having contractions q 2 minutes tonight. Denies bleeding. Reports a clear discharge at 1700. Denies problems with pregnancy.

## 2015-02-19 ENCOUNTER — Inpatient Hospital Stay (HOSPITAL_COMMUNITY): Payer: 59 | Admitting: Anesthesiology

## 2015-02-19 ENCOUNTER — Other Ambulatory Visit: Payer: 59

## 2015-02-19 ENCOUNTER — Encounter (HOSPITAL_COMMUNITY): Payer: Self-pay | Admitting: General Practice

## 2015-02-19 ENCOUNTER — Encounter (HOSPITAL_COMMUNITY)
Admission: AD | Disposition: A | Discharge status: Home / Self Care | Payer: Self-pay | Source: Ambulatory Visit | Attending: Family Medicine

## 2015-02-19 DIAGNOSIS — Z3A40 40 weeks gestation of pregnancy: Secondary | ICD-10-CM | POA: Diagnosis present

## 2015-02-19 DIAGNOSIS — O99214 Obesity complicating childbirth: Secondary | ICD-10-CM | POA: Diagnosis present

## 2015-02-19 DIAGNOSIS — O2603 Excessive weight gain in pregnancy, third trimester: Secondary | ICD-10-CM | POA: Diagnosis present

## 2015-02-19 DIAGNOSIS — J45909 Unspecified asthma, uncomplicated: Secondary | ICD-10-CM | POA: Diagnosis present

## 2015-02-19 DIAGNOSIS — Z833 Family history of diabetes mellitus: Secondary | ICD-10-CM | POA: Diagnosis not present

## 2015-02-19 DIAGNOSIS — Z8249 Family history of ischemic heart disease and other diseases of the circulatory system: Secondary | ICD-10-CM | POA: Diagnosis not present

## 2015-02-19 DIAGNOSIS — IMO0001 Reserved for inherently not codable concepts without codable children: Secondary | ICD-10-CM

## 2015-02-19 DIAGNOSIS — O9952 Diseases of the respiratory system complicating childbirth: Secondary | ICD-10-CM | POA: Diagnosis present

## 2015-02-19 DIAGNOSIS — Z6841 Body Mass Index (BMI) 40.0 and over, adult: Secondary | ICD-10-CM | POA: Diagnosis not present

## 2015-02-19 LAB — CBC
HEMATOCRIT: 34 % — AB (ref 36.0–46.0)
HEMOGLOBIN: 11.1 g/dL — AB (ref 12.0–15.0)
MCH: 28.4 pg (ref 26.0–34.0)
MCHC: 32.6 g/dL (ref 30.0–36.0)
MCV: 87 fL (ref 78.0–100.0)
Platelets: 269 10*3/uL (ref 150–400)
RBC: 3.91 MIL/uL (ref 3.87–5.11)
RDW: 13.9 % (ref 11.5–15.5)
WBC: 8.2 10*3/uL (ref 4.0–10.5)

## 2015-02-19 LAB — TYPE AND SCREEN
ABO/RH(D): A POS
ANTIBODY SCREEN: NEGATIVE

## 2015-02-19 LAB — ABO/RH: ABO/RH(D): A POS

## 2015-02-19 SURGERY — Surgical Case
Anesthesia: Epidural

## 2015-02-19 MED ORDER — OXYTOCIN BOLUS FROM INFUSION: 500.0000 mL | INTRAVENOUS | Status: DC

## 2015-02-19 MED ORDER — OXYTOCIN 40 UNITS IN LACTATED RINGERS INFUSION - SIMPLE MED: 1.0000 m[IU]/min | INTRAVENOUS | Status: DC

## 2015-02-19 MED ORDER — OXYTOCIN 40 UNITS IN LACTATED RINGERS INFUSION - SIMPLE MED: 62.5000 mL/h | INTRAVENOUS | Status: DC

## 2015-02-19 MED ORDER — DIPHENHYDRAMINE HCL 50 MG/ML IJ SOLN: 12.5000 mg | INTRAMUSCULAR | Status: DC | PRN

## 2015-02-19 MED ORDER — EPHEDRINE 5 MG/ML INJ: 10.0000 mg | INTRAVENOUS | Status: DC | PRN

## 2015-02-19 MED ORDER — ACETAMINOPHEN 325 MG PO TABS: 650.0000 mg | ORAL_TABLET | ORAL | Status: DC | PRN

## 2015-02-19 MED ORDER — LIDOCAINE HCL (PF) 1 % IJ SOLN
INTRAMUSCULAR | Status: DC | PRN
  Administered 2015-02-19: 3 mL
  Administered 2015-02-19 (×2): 5 mL

## 2015-02-19 MED ORDER — CITRIC ACID-SODIUM CITRATE 334-500 MG/5ML PO SOLN
30.0000 mL | ORAL | Status: DC | PRN
  Administered 2015-02-19: 30 mL via ORAL
  Filled 2015-02-19: qty 15

## 2015-02-19 MED ORDER — LIDOCAINE HCL (PF) 1 % IJ SOLN: 30.0000 mL | INTRAMUSCULAR | Status: DC | PRN

## 2015-02-19 MED ORDER — TERBUTALINE SULFATE 1 MG/ML IJ SOLN: 0.2500 mg | Freq: Once | INTRAMUSCULAR | Status: DC | PRN

## 2015-02-19 MED ORDER — OXYTOCIN 40 UNITS IN LACTATED RINGERS INFUSION - SIMPLE MED
1.0000 m[IU]/min | INTRAVENOUS | Status: DC
  Administered 2015-02-19: 2 m[IU]/min via INTRAVENOUS
  Filled 2015-02-19: qty 1000

## 2015-02-19 MED ORDER — FENTANYL 2.5 MCG/ML BUPIVACAINE 1/10 % EPIDURAL INFUSION (WH - ANES)
14.0000 mL/h | INTRAMUSCULAR | Status: DC | PRN
  Administered 2015-02-19 (×3): 14 mL/h via EPIDURAL
  Filled 2015-02-19 (×3): qty 125

## 2015-02-19 MED ORDER — PHENYLEPHRINE 40 MCG/ML (10ML) SYRINGE FOR IV PUSH (FOR BLOOD PRESSURE SUPPORT)
80.0000 ug | PREFILLED_SYRINGE | INTRAVENOUS | Status: DC | PRN
  Filled 2015-02-19: qty 20

## 2015-02-19 MED ORDER — OXYCODONE-ACETAMINOPHEN 5-325 MG PO TABS: 1.0000 | ORAL_TABLET | ORAL | Status: DC | PRN

## 2015-02-19 MED ORDER — LACTATED RINGERS IV SOLN
INTRAVENOUS | Status: DC
  Administered 2015-02-19 – 2015-02-20 (×7): via INTRAVENOUS

## 2015-02-19 MED ORDER — ONDANSETRON HCL 4 MG/2ML IJ SOLN
4.0000 mg | Freq: Four times a day (QID) | INTRAMUSCULAR | Status: DC | PRN
  Administered 2015-02-19: 4 mg via INTRAVENOUS
  Filled 2015-02-19: qty 2

## 2015-02-19 MED ORDER — MORPHINE SULFATE (PF) 0.5 MG/ML IJ SOLN
INTRAMUSCULAR | Status: AC
  Filled 2015-02-19: qty 100

## 2015-02-19 MED ORDER — LACTATED RINGERS IV SOLN
500.0000 mL | INTRAVENOUS | Status: DC | PRN
  Administered 2015-02-19 (×2): 500 mL via INTRAVENOUS
  Administered 2015-02-19: 1000 mL via INTRAVENOUS
  Administered 2015-02-19: 500 mL via INTRAVENOUS

## 2015-02-19 MED ORDER — OXYCODONE-ACETAMINOPHEN 5-325 MG PO TABS: 2.0000 | ORAL_TABLET | ORAL | Status: DC | PRN

## 2015-02-19 SURGICAL SUPPLY — 34 items
APL SKNCLS STERI-STRIP NONHPOA (GAUZE/BANDAGES/DRESSINGS) ×1
BENZOIN TINCTURE PRP APPL 2/3 (GAUZE/BANDAGES/DRESSINGS) ×2 IMPLANT
CATH ROBINSON RED A/P 16FR (CATHETERS) IMPLANT
CLAMP CORD UMBIL (MISCELLANEOUS) IMPLANT
CLOTH BEACON ORANGE TIMEOUT ST (SAFETY) ×2 IMPLANT
DRAPE SHEET LG 3/4 BI-LAMINATE (DRAPES) IMPLANT
DRSG OPSITE POSTOP 4X10 (GAUZE/BANDAGES/DRESSINGS) ×2 IMPLANT
DURAPREP 26ML APPLICATOR (WOUND CARE) ×2 IMPLANT
ELECT REM PT RETURN 9FT ADLT (ELECTROSURGICAL) ×2
ELECTRODE REM PT RTRN 9FT ADLT (ELECTROSURGICAL) ×1 IMPLANT
EXTRACTOR VACUUM M CUP 4 TUBE (SUCTIONS) IMPLANT
GLOVE BIOGEL PI IND STRL 7.5 (GLOVE) ×2 IMPLANT
GLOVE BIOGEL PI INDICATOR 7.5 (GLOVE) ×2
GLOVE ECLIPSE 7.5 STRL STRAW (GLOVE) ×2 IMPLANT
GOWN STRL REUS W/TWL LRG LVL3 (GOWN DISPOSABLE) ×6 IMPLANT
KIT ABG SYR 3ML LUER SLIP (SYRINGE) IMPLANT
NDL HYPO 25X5/8 SAFETYGLIDE (NEEDLE) IMPLANT
NEEDLE HYPO 25X5/8 SAFETYGLIDE (NEEDLE) IMPLANT
NS IRRIG 1000ML POUR BTL (IV SOLUTION) ×2 IMPLANT
PACK C SECTION WH (CUSTOM PROCEDURE TRAY) ×2 IMPLANT
PAD OB MATERNITY 4.3X12.25 (PERSONAL CARE ITEMS) ×2 IMPLANT
PENCIL SMOKE EVAC W/HOLSTER (ELECTROSURGICAL) ×2 IMPLANT
RETAINER VISCERAL (MISCELLANEOUS) ×1 IMPLANT
RTRCTR C-SECT PINK 25CM LRG (MISCELLANEOUS) ×2 IMPLANT
STRIP CLOSURE SKIN 1/2X4 (GAUZE/BANDAGES/DRESSINGS) ×2 IMPLANT
SUT MNCRL 0 VIOLET CTX 36 (SUTURE) IMPLANT
SUT MONOCRYL 0 CTX 36 (SUTURE)
SUT VIC AB 0 CTX 36 (SUTURE) ×6
SUT VIC AB 0 CTX36XBRD ANBCTRL (SUTURE) ×3 IMPLANT
SUT VIC AB 2-0 CT1 27 (SUTURE) ×2
SUT VIC AB 2-0 CT1 TAPERPNT 27 (SUTURE) ×1 IMPLANT
SUT VIC AB 4-0 KS 27 (SUTURE) ×2 IMPLANT
TOWEL OR 17X24 6PK STRL BLUE (TOWEL DISPOSABLE) ×2 IMPLANT
TRAY FOLEY CATH SILVER 14FR (SET/KITS/TRAYS/PACK) ×2 IMPLANT

## 2015-02-19 NOTE — Progress Notes (Signed)
Labor Progress Note  S: Patient resting comfortably after epidural. Not feeling contractions  O:  BP 123/78 mmHg   Pulse 91   Temp(Src) 99.1 F (37.3 C) (Oral)   Resp 18   Ht 5' (1.524 m)   Wt 209 lb (94.802 kg)   BMI 40.82 kg/m2   SpO2 100%   LMP 04/24/2014 EFM: 125, mod variability, +accels, -decels Toco: contractions q5-67min AVW:UJWJXBJY: 4 Effacement (%): 80 Station: -3 Presentation: Vertex Exam by::  (dr. Redmond Baseman)   A&P: 29 y.o. G2P0010 [redacted]w[redacted]d here for SOL #Labor:protracted labor after epidural, no cervical change; augment with pitocin #FWB: category 1 tracing #GBS: neg  [name] Resident Physician 4:14 AM

## 2015-02-19 NOTE — Progress Notes (Signed)
LABOR PROGRESS NOTE  Nicole Brooks is a 29 y.o. G2P0010 at [redacted]w[redacted]d  admitted for spontaneous labor.  Subjective: No pain s/p epidural  Objective: BP 142/82 mmHg  Pulse 79  Temp(Src) 98.5 F (36.9 C) (Oral)  Resp 22  Ht 5' (1.524 m)  Wt 209 lb (94.802 kg)  BMI 40.82 kg/m2  SpO2 100%  LMP 04/24/2014 or  Filed Vitals:   02/19/15 1430 02/19/15 1500 02/19/15 1530 02/19/15 1600  BP: 100/58 114/64 124/72 142/82  Pulse: 75 78 74 79  Temp:    98.5 F (36.9 C)  TempSrc:    Oral  Resp:    22  Height:      Weight:      SpO2:        Dilation: 7 Effacement (%): 90 Cervical Position: Middle Station: -1 Presentation: Vertex Exam by:: Dr. Marcille Blanco  Labs: Lab Results  Component Value Date   WBC 8.2 02/19/2015   HGB 11.1* 02/19/2015   HCT 34.0* 02/19/2015   MCV 87.0 02/19/2015   PLT 269 02/19/2015    Patient Active Problem List   Diagnosis Date Noted  . Active labor at term 02/19/2015  . Not immune to rubella 02/07/2015  . Excess weight gain in pregnancy 01/03/2015  . Pregnancy-related glycosuria 01/03/2015  . Obesity complicating pregnancy in second trimester   . History of cardiomegaly 08/29/2014  . Supervision of normal pregnancy in first trimester 08/01/2014  . Migraine 05/12/2012    Assessment / Plan: 29 y.o. G2P0010 at [redacted]w[redacted]d here for active labor, spontaneous. At approximately 13:00 I was called to the room to evaluate a prolonged deceleration. Pitocin was stopped, patient was given oxygen, and I placed a FSE (IUPC already in place). Fetal heart rate noted to be 150s at that time, and patient had no more decels of such magnitude, eventually resolving to category 1 strip. Pitocin was held for 1.5 hours and we will now restart. Cervix unchanged - 7 cm dilated  Labor: up-titrate pitocin Fetal Wellbeing:  Category 1 Pain Control:  Epidural in place Anticipated MOD:  vaginal  Silvano Bilis, MD 02/19/2015, 4:14 PM

## 2015-02-19 NOTE — Progress Notes (Signed)
Patient ID: Nicole Brooks, female   DOB: 01-03-1986, 29 y.o.   MRN: [id number]  Patient having recurrent late decelerations with increased pitocin.  Not able to obtain adequate labor.  Discussed with the patient options of surgery vs stopping pitocin for several hours, then restarting.  Patient opted for PLTCS, which I agreed would be the best course of action.  The risks of cesarean section discussed with the patient included but were not limited to: bleeding which may require transfusion or reoperation; infection which may require antibiotics; injury to bowel, bladder, ureters or other surrounding organs; injury to the fetus; need for additional procedures including hysterectomy in the event of a life-threatening hemorrhage; placental abnormalities wth subsequent pregnancies, incisional problems, thromboembolic phenomenon and other postoperative/anesthesia complications. The patient concurred with the proposed plan, giving informed written consent for the procedure.   Patient has been NPO since this morning. She will remain NPO for procedure. Anesthesia and OR aware.  Preoperative prophylactic Ancef ordered on call to the OR.  To OR when ready.  Levie Heritage, DO 02/19/2015 11:26 PM

## 2015-02-19 NOTE — Anesthesia Procedure Notes (Signed)
Epidural Patient location during procedure: OB  Staffing Anesthesiologist: CARIGNAN, PETER Performed by: anesthesiologist   Preanesthetic Checklist Completed: patient identified, site marked, surgical consent, pre-op evaluation, timeout performed, IV checked, risks and benefits discussed and monitors and equipment checked  Epidural Patient position: sitting Prep: DuraPrep Patient monitoring: heart rate, continuous pulse ox and blood pressure Approach: right paramedian Location: L3-L4 Injection technique: LOR saline  Needle:  Needle type: Tuohy  Needle gauge: 17 G Needle length: 9 cm and 9 Needle insertion depth: 7 cm Catheter type: closed end flexible Catheter size: 20 Guage Catheter at skin depth: 11 cm Test dose: negative  Assessment Events: blood not aspirated, injection not painful, no injection resistance, negative IV test and no paresthesia  Additional Notes Patient identified. Risks/Benefits/Options discussed with patient including but not limited to bleeding, infection, nerve damage, paralysis, failed block, incomplete pain control, headache, blood pressure changes, nausea, vomiting, reactions to medication both or allergic, itching and postpartum back pain. Confirmed with bedside nurse the patient's most recent platelet count. Confirmed with patient that they are not currently taking any anticoagulation, have any bleeding history or any family history of bleeding disorders. Patient expressed understanding and wished to proceed. All questions were answered. Sterile technique was used throughout the entire procedure. Please see nursing notes for vital signs. Test dose was given through epidural needle and negative prior to continuing to dose epidural or start infusion. Warning signs of high block given to the patient including shortness of breath, tingling/numbness in hands, complete motor block, or any concerning symptoms with instructions to call for help. Patient was given  instructions on fall risk and not to get out of bed. All questions and concerns addressed with instructions to call with any issues.   

## 2015-02-19 NOTE — Progress Notes (Addendum)
Labor Progress Note  S: Not feeling contractions, uncomfortable in her current position  O:  BP 121/64 mmHg   Pulse 89   Temp(Src) 98.9 F (37.2 C) (Oral)   Resp 18   Ht 5' (1.524 m)   Wt 209 lb (94.802 kg)   BMI 40.82 kg/m2   SpO2 100%   LMP 04/24/2014 EFM: FHR: 130, moderate variability, +accels, occasional variables, one late decel after coupling of contractions; MVU: 110 XBM:WUXLKGMW: 7 Effacement (%): 90 Cervical Position: Middle Station: -1 Presentation: Vertex Exam by:: Dr. Ashok Pall   A&P: 29 y.o. G2P0010 [redacted]w[redacted]d here for spontaneous active labor. Patient was started on pitocin for protracted active phase of labor but was stopped due to a prolonged deceleration. Pitocin was restarted at a slower rate. #Labor: Arrest of dilation. Continue to increase pitocin 1 x 1. Continue to titrate pit to MVU: >180. Has IUPC and FSE in place. #FWB: category 2 tracing #GBS: negative #s/p epidural   [name] Resident Physician 9:01 PM

## 2015-02-19 NOTE — H&P (Signed)
OBSTETRIC ADMISSION HISTORY AND PHYSICAL  Nicole Brooks is a 29 y.o. female G2P0010 with IUP at [redacted]w[redacted]d by 1st trimester u/s presenting for regular contractions since 6pm. She reports +FMs, No LOF, no VB, no blurry vision, headaches or peripheral edema, and RUQ pain.  She plans on breast feeding. She request Depo-Provera for birth control.  Dating: By 1st trimester u/s --->  Estimated Date of Delivery: 02/15/15  Sono:    , CWD, normal anatomy, breech presentation,  353g, 55% EFW , CWD, normal anatomy, cephalic presentation, 930g, 67% EFW  Prenatal History/Complications: Clinic [facility] Prenatal Labs  Dating U/S in 1st trim Blood type: A/POS/-- (03/01 1004)   Genetic Screen 1 Screen: AFP: Quad: Neg NIPS: Antibody:NEG (03/01 1004)  Anatomic [place] Normal female, but Incomplete anatomy scan.  F/U 6-8 weeks-normal, compete Rubella: NOT IMMUNE  GTT Early: 112 Third trimester: 95 RPR: NON REAC (03/01 1004)   Flu vaccine 01/16 HBsAg: NEGATIVE (03/01 1004)   TDaP vaccine  11/21/14 Rhogam: NA HIV: NONREACTIVE (03/01 1004)   GBS Neg (For PCN allergy, check sensitivities) GBS: negative  Contraception Depo ZOX:WRUEAV  Baby Food Breast   Circumcision IP   Pediatrician    Support Person FOBMarcelle Overlie       Past Medical History: Past Medical History  Diagnosis Date  . Asthma   . Cardiomegaly     Past Surgical History: Past Surgical History  Procedure Laterality Date  . Dilation and evacuation  05/17/2012    Procedure: DILATATION AND EVACUATION;  Surgeon: Willodean Rosenthal, MD;  Location: [facility];  Service: Gynecology;  Laterality: N/A;  . Dilation and evacuation  05/17/2012    Obstetrical History: OB History    Gravida Para Term Preterm AB TAB SAB Ectopic Multiple Living        Social History: Social History   Social History  . Marital Status:  Single    Spouse Name: N/A  . Number of Children: N/A  . Years of Education: N/A   Social History Main Topics  . Smoking status: Never Smoker   . Smokeless tobacco: Never Used  . Alcohol Use: No  . Drug Use: No  . Sexual Activity: Yes    Birth Control/ Protection: None   Other Topics Concern  . None   Social History Narrative    Family History: Family History  Problem Relation Age of Onset  . Diabetes Mother   . Hypertension Mother     Allergies: No Known Allergies  Prescriptions prior to admission  Medication Sig Dispense Refill Last Dose  . oxyCODONE-acetaminophen (PERCOCET/ROXICET) 5-325 MG per tablet Take 1 tablet by mouth every 4 (four) hours as needed for severe pain. (Patient not taking: Reported on 02/14/2015) 6 tablet 0 Not Taking  . prenatal vitamin w/FE, FA (PRENATAL 1 + 1) 27-1 MG TABS tablet Take 1 tablet by mouth daily at 12 noon.   Taking     Review of Systems   All systems reviewed and negative except as stated in HPI  Blood pressure 132/57, pulse 95, temperature 99.1 F (37.3 C), temperature source Oral, resp. rate 18, height 5' (1.524 m), weight 209 lb (94.802 kg), last menstrual period 04/24/2014, SpO2 99 %. General appearance: alert, cooperative, appears stated age and mild distress Lungs: clear to auscultation bilaterally Heart: regular rate and rhythm Abdomen: soft, non-tender; bowel sounds normal Pelvic: 4/70/-3, no LOF, negative ferning, negative pooling Extremities: Homans sign is negative, no sign of DVT  Presentation: cephalic Fetal monitoringBaseline: 130 bpm, Variability: Good {> 6 bpm), Accelerations: Reactive and Decelerations: Absent Uterine activityFrequency: Every 1-2 minutes    Prenatal labs: ABO, Rh: --/--/A POS, A POS (09/19 0050) Antibody: NEG (09/19 0050) Rubella:  non-immune RPR: NON REAC (06/21 1123)  HBsAg: NEGATIVE (03/01 1004)  HIV: NONREACTIVE (06/21 1123)  GBS: Negative (08/17 0000)  1 hr Glucola 112 Genetic  screening  negative Anatomy US normal  Prenatal Transfer Tool  Maternal Diabetes: No Genetic Screening: Normal Maternal Ultrasounds/Referrals: Normal Fetal Ultrasounds or other Referrals:  None Maternal Substance Abuse:  No Significant Maternal Medications:  None Significant Maternal Lab Results: None  Results for orders placed or performed during the hospital encounter of 02/18/15 (from the past 24 hour(s))  CBC   Collection Time: 02/19/15 12:50 AM  Result Value Ref Range   WBC 8.2 4.0 - 10.5 K/uL   RBC 3.91 3.87 - 5.11 MIL/uL   Hemoglobin 11.1 (L) 12.0 - 15.0 g/dL   HCT 40.9 (L) 81.1 - 91.4 %   MCV 87.0 78.0 - 100.0 fL   MCH 28.4 26.0 - 34.0 pg   MCHC 32.6 30.0 - 36.0 g/dL   RDW 78.2 95.6 - 21.3 %   Platelets 269 150 - 400 K/uL  Type and screen   Collection Time: 02/19/15 12:50 AM  Result Value Ref Range   ABO/RH(D) A POS    Antibody Screen NEG    Sample Expiration 02/22/2015   ABO/Rh   Collection Time: 02/19/15 12:50 AM  Result Value Ref Range   ABO/RH(D) A POS     Patient Active Problem List   Diagnosis Date Noted  . Active labor at term 02/19/2015  . Not immune to rubella 02/07/2015  . Excess weight gain in pregnancy 01/03/2015  . Pregnancy-related glycosuria 01/03/2015  . Obesity complicating pregnancy in second trimester   . History of cardiomegaly 08/29/2014  . Supervision of normal pregnancy in first trimester 08/01/2014  . Migraine 05/12/2012    Assessment: Nicole Brooks is a 29 y.o. G2P0010 at [redacted]w[redacted]d here for SOL  #Labor: progressing well, contracting q1-2h #Pain: Epidural #FWB: Category 1 tracing #ID:  GBS negative, rubella non immune-will need vaccination s/p delivery #MOF: Breast #MOC: Depo-Provera #Circ:  inpatient  Nicole Brooks 02/19/2015, 2:55 AM

## 2015-02-19 NOTE — Anesthesia Preprocedure Evaluation (Signed)
Anesthesia Evaluation  Patient identified by MRN, date of birth, ID band Patient awake    Reviewed: Allergy & Precautions, H&P , NPO status , Patient's Chart, lab work & pertinent test results  History of Anesthesia Complications Negative for: history of anesthetic complications  Airway Mallampati: II  TM Distance: >3 FB Neck ROM: full    Dental no notable dental hx. (+) Teeth Intact   Pulmonary asthma ,    Pulmonary exam normal breath sounds clear to auscultation       Cardiovascular negative cardio ROS Normal cardiovascular exam Rhythm:regular Rate:Normal     Neuro/Psych negative neurological ROS  negative psych ROS   GI/Hepatic negative GI ROS, Neg liver ROS,   Endo/Other  Morbid obesity  Renal/GU negative Renal ROS  negative genitourinary   Musculoskeletal   Abdominal   Peds  Hematology negative hematology ROS (+)   Anesthesia Other Findings   Reproductive/Obstetrics (+) Pregnancy                             Anesthesia Physical Anesthesia Plan  ASA: II  Anesthesia Plan: Epidural   Post-op Pain Management:    Induction:   Airway Management Planned:   Additional Equipment:   Intra-op Plan:   Post-operative Plan:   Informed Consent: I have reviewed the patients History and Physical, chart, labs and discussed the procedure including the risks, benefits and alternatives for the proposed anesthesia with the patient or authorized representative who has indicated his/her understanding and acceptance.     Plan Discussed with:   Anesthesia Plan Comments:         Anesthesia Quick Evaluation

## 2015-02-20 ENCOUNTER — Encounter (HOSPITAL_COMMUNITY): Payer: Self-pay | Admitting: *Deleted

## 2015-02-20 DIAGNOSIS — O2603 Excessive weight gain in pregnancy, third trimester: Secondary | ICD-10-CM

## 2015-02-20 DIAGNOSIS — Z3A40 40 weeks gestation of pregnancy: Secondary | ICD-10-CM

## 2015-02-20 DIAGNOSIS — O99214 Obesity complicating childbirth: Secondary | ICD-10-CM

## 2015-02-20 LAB — RPR: RPR Ser Ql: NONREACTIVE

## 2015-02-20 LAB — CBC
HCT: 32.1 % — ABNORMAL LOW (ref 36.0–46.0)
HEMOGLOBIN: 10.2 g/dL — AB (ref 12.0–15.0)
MCH: 27.9 pg (ref 26.0–34.0)
MCHC: 31.8 g/dL (ref 30.0–36.0)
MCV: 87.7 fL (ref 78.0–100.0)
Platelets: 217 10*3/uL (ref 150–400)
RBC: 3.66 MIL/uL — AB (ref 3.87–5.11)
RDW: 13.6 % (ref 11.5–15.5)
WBC: 14.2 10*3/uL — AB (ref 4.0–10.5)

## 2015-02-20 MED ORDER — DEXAMETHASONE SODIUM PHOSPHATE 10 MG/ML IJ SOLN
INTRAMUSCULAR | Status: AC
  Filled 2015-02-20: qty 1

## 2015-02-20 MED ORDER — PRENATAL MULTIVITAMIN CH
1.0000 | ORAL_TABLET | Freq: Every day | ORAL | Status: DC
Start: 2015-02-20 — End: 2015-02-22
  Administered 2015-02-20 – 2015-02-22 (×3): 1 via ORAL
  Filled 2015-02-20 (×3): qty 1

## 2015-02-20 MED ORDER — DIPHENHYDRAMINE HCL 25 MG PO CAPS: 25.0000 mg | ORAL_CAPSULE | ORAL | Status: DC | PRN

## 2015-02-20 MED ORDER — FENTANYL CITRATE (PF) 250 MCG/5ML IJ SOLN
INTRAMUSCULAR | Status: AC
  Filled 2015-02-20: qty 25

## 2015-02-20 MED ORDER — SODIUM BICARBONATE 8.4 % IV SOLN
INTRAVENOUS | Status: DC | PRN
  Administered 2015-02-19: 10 mL via EPIDURAL
  Administered 2015-02-20: 5 mL via EPIDURAL

## 2015-02-20 MED ORDER — MEPERIDINE HCL 25 MG/ML IJ SOLN
INTRAMUSCULAR | Status: DC | PRN
  Administered 2015-02-20: 12.5 mg via INTRAVENOUS

## 2015-02-20 MED ORDER — DEXAMETHASONE SODIUM PHOSPHATE 10 MG/ML IJ SOLN
INTRAMUSCULAR | Status: DC | PRN
  Administered 2015-02-20: 10 mg via INTRAVENOUS

## 2015-02-20 MED ORDER — HYDROMORPHONE HCL 1 MG/ML IJ SOLN
INTRAMUSCULAR | Status: AC
  Filled 2015-02-20: qty 1

## 2015-02-20 MED ORDER — TETANUS-DIPHTH-ACELL PERTUSSIS 5-2.5-18.5 LF-MCG/0.5 IM SUSP: 0.5000 mL | Freq: Once | INTRAMUSCULAR | Status: DC

## 2015-02-20 MED ORDER — NALBUPHINE HCL 10 MG/ML IJ SOLN
5.0000 mg | Freq: Once | INTRAMUSCULAR | Status: DC | PRN
  Filled 2015-02-20: qty 0.5

## 2015-02-20 MED ORDER — ZOLPIDEM TARTRATE 5 MG PO TABS: 5.0000 mg | ORAL_TABLET | Freq: Every evening | ORAL | Status: DC | PRN

## 2015-02-20 MED ORDER — LACTATED RINGERS IV SOLN
125.0000 mL/h | INTRAVENOUS | Status: DC
  Administered 2015-02-20: 125 mL/h via INTRAVENOUS

## 2015-02-20 MED ORDER — WITCH HAZEL-GLYCERIN EX PADS: 1.0000 "application " | MEDICATED_PAD | CUTANEOUS | Status: DC | PRN

## 2015-02-20 MED ORDER — KETOROLAC TROMETHAMINE 30 MG/ML IJ SOLN: 30.0000 mg | Freq: Four times a day (QID) | INTRAMUSCULAR | Status: DC | PRN

## 2015-02-20 MED ORDER — SENNOSIDES-DOCUSATE SODIUM 8.6-50 MG PO TABS
2.0000 | ORAL_TABLET | ORAL | Status: DC
  Administered 2015-02-21 (×2): 2 via ORAL
  Filled 2015-02-20 (×3): qty 2

## 2015-02-20 MED ORDER — ONDANSETRON HCL 4 MG/2ML IJ SOLN: 4.0000 mg | Freq: Three times a day (TID) | INTRAMUSCULAR | Status: DC | PRN

## 2015-02-20 MED ORDER — MEPERIDINE HCL 25 MG/ML IJ SOLN: 6.2500 mg | INTRAMUSCULAR | Status: DC | PRN

## 2015-02-20 MED ORDER — FENTANYL CITRATE (PF) 100 MCG/2ML IJ SOLN
INTRAMUSCULAR | Status: AC
  Filled 2015-02-20: qty 4

## 2015-02-20 MED ORDER — DIPHENHYDRAMINE HCL 25 MG PO CAPS: 25.0000 mg | ORAL_CAPSULE | Freq: Four times a day (QID) | ORAL | Status: DC | PRN

## 2015-02-20 MED ORDER — NALBUPHINE HCL 10 MG/ML IJ SOLN
5.0000 mg | INTRAMUSCULAR | Status: DC | PRN
  Filled 2015-02-20: qty 0.5

## 2015-02-20 MED ORDER — LACTATED RINGERS IV SOLN: INTRAVENOUS | Status: DC

## 2015-02-20 MED ORDER — HYDROMORPHONE HCL 1 MG/ML IJ SOLN
0.2500 mg | INTRAMUSCULAR | Status: DC | PRN
  Administered 2015-02-20 (×4): 0.5 mg via INTRAVENOUS

## 2015-02-20 MED ORDER — SIMETHICONE 80 MG PO CHEW
80.0000 mg | CHEWABLE_TABLET | Freq: Three times a day (TID) | ORAL | Status: DC
  Administered 2015-02-20 – 2015-02-22 (×5): 80 mg via ORAL
  Filled 2015-02-20 (×7): qty 1

## 2015-02-20 MED ORDER — ONDANSETRON HCL 4 MG/2ML IJ SOLN
INTRAMUSCULAR | Status: DC | PRN
  Administered 2015-02-20: 4 mg via INTRAVENOUS

## 2015-02-20 MED ORDER — NALOXONE HCL 1 MG/ML IJ SOLN
1.0000 ug/kg/h | INTRAMUSCULAR | Status: DC | PRN
  Filled 2015-02-20: qty 2

## 2015-02-20 MED ORDER — SCOPOLAMINE 1 MG/3DAYS TD PT72
MEDICATED_PATCH | TRANSDERMAL | Status: AC
  Filled 2015-02-20: qty 1

## 2015-02-20 MED ORDER — OXYTOCIN 10 UNIT/ML IJ SOLN
INTRAMUSCULAR | Status: AC
  Filled 2015-02-20: qty 4

## 2015-02-20 MED ORDER — CEFAZOLIN SODIUM-DEXTROSE 2-3 GM-% IV SOLR
2.0000 g | INTRAVENOUS | Status: DC
  Filled 2015-02-20: qty 50

## 2015-02-20 MED ORDER — PROMETHAZINE HCL 25 MG/ML IJ SOLN: 6.2500 mg | INTRAMUSCULAR | Status: DC | PRN

## 2015-02-20 MED ORDER — LACTATED RINGERS IV SOLN
INTRAVENOUS | Status: DC | PRN
Start: 2015-02-20 — End: 2015-02-20
  Administered 2015-02-20: via INTRAVENOUS

## 2015-02-20 MED ORDER — SCOPOLAMINE 1 MG/3DAYS TD PT72
1.0000 | MEDICATED_PATCH | Freq: Once | TRANSDERMAL | Status: DC
  Filled 2015-02-20: qty 1

## 2015-02-20 MED ORDER — OXYCODONE-ACETAMINOPHEN 5-325 MG PO TABS
1.0000 | ORAL_TABLET | ORAL | Status: DC | PRN
  Administered 2015-02-20 – 2015-02-21 (×3): 1 via ORAL
  Filled 2015-02-20 (×3): qty 1

## 2015-02-20 MED ORDER — FENTANYL CITRATE (PF) 100 MCG/2ML IJ SOLN
INTRAMUSCULAR | Status: AC
  Filled 2015-02-20: qty 2

## 2015-02-20 MED ORDER — OXYCODONE-ACETAMINOPHEN 5-325 MG PO TABS
2.0000 | ORAL_TABLET | ORAL | Status: DC | PRN
  Administered 2015-02-21: 2 via ORAL
  Filled 2015-02-20: qty 2

## 2015-02-20 MED ORDER — SODIUM CHLORIDE 0.9 % IJ SOLN: 3.0000 mL | INTRAMUSCULAR | Status: DC | PRN

## 2015-02-20 MED ORDER — ACETAMINOPHEN 500 MG PO TABS
1000.0000 mg | ORAL_TABLET | Freq: Four times a day (QID) | ORAL | Status: AC
  Administered 2015-02-20: 500 mg via ORAL
  Administered 2015-02-21: 1000 mg via ORAL
  Filled 2015-02-20 (×2): qty 2

## 2015-02-20 MED ORDER — DIBUCAINE 1 % RE OINT: 1.0000 "application " | TOPICAL_OINTMENT | RECTAL | Status: DC | PRN

## 2015-02-20 MED ORDER — FENTANYL CITRATE (PF) 100 MCG/2ML IJ SOLN
50.0000 ug | INTRAMUSCULAR | Status: DC | PRN
  Administered 2015-02-20: 50 ug via INTRAVENOUS

## 2015-02-20 MED ORDER — KETOROLAC TROMETHAMINE 30 MG/ML IJ SOLN
30.0000 mg | Freq: Once | INTRAMUSCULAR | Status: AC | PRN
  Administered 2015-02-20: 30 mg via INTRAVENOUS

## 2015-02-20 MED ORDER — ACETAMINOPHEN 325 MG PO TABS: 650.0000 mg | ORAL_TABLET | ORAL | Status: DC | PRN

## 2015-02-20 MED ORDER — KETOROLAC TROMETHAMINE 30 MG/ML IJ SOLN
INTRAMUSCULAR | Status: AC
  Filled 2015-02-20: qty 1

## 2015-02-20 MED ORDER — IBUPROFEN 600 MG PO TABS
600.0000 mg | ORAL_TABLET | Freq: Four times a day (QID) | ORAL | Status: DC
  Administered 2015-02-20 – 2015-02-22 (×9): 600 mg via ORAL
  Filled 2015-02-20 (×9): qty 1

## 2015-02-20 MED ORDER — MEPERIDINE HCL 25 MG/ML IJ SOLN
INTRAMUSCULAR | Status: AC
  Filled 2015-02-20: qty 1

## 2015-02-20 MED ORDER — SCOPOLAMINE 1 MG/3DAYS TD PT72
MEDICATED_PATCH | TRANSDERMAL | Status: DC | PRN
  Administered 2015-02-20: 1 via TRANSDERMAL

## 2015-02-20 MED ORDER — MENTHOL 3 MG MT LOZG: 1.0000 | LOZENGE | OROMUCOSAL | Status: DC | PRN

## 2015-02-20 MED ORDER — ONDANSETRON HCL 4 MG/2ML IJ SOLN
INTRAMUSCULAR | Status: AC
  Filled 2015-02-20: qty 2

## 2015-02-20 MED ORDER — LANOLIN HYDROUS EX OINT
1.0000 | TOPICAL_OINTMENT | CUTANEOUS | Status: DC | PRN
Start: 2015-02-20 — End: 2015-02-22

## 2015-02-20 MED ORDER — CEFAZOLIN SODIUM-DEXTROSE 2-3 GM-% IV SOLR
INTRAVENOUS | Status: DC | PRN
Start: 2015-02-19 — End: 2015-02-20
  Administered 2015-02-19: 2 g via INTRAVENOUS

## 2015-02-20 MED ORDER — IBUPROFEN 600 MG PO TABS
600.0000 mg | ORAL_TABLET | Freq: Four times a day (QID) | ORAL | Status: DC | PRN
  Filled 2015-02-20: qty 1

## 2015-02-20 MED ORDER — MORPHINE SULFATE (PF) 0.5 MG/ML IJ SOLN
INTRAMUSCULAR | Status: DC | PRN
  Administered 2015-02-20: 3 mg via EPIDURAL
  Administered 2015-02-20: 2 mg via INTRAVENOUS

## 2015-02-20 MED ORDER — FENTANYL CITRATE (PF) 100 MCG/2ML IJ SOLN
INTRAMUSCULAR | Status: DC | PRN
  Administered 2015-02-20 (×2): 100 ug via INTRAVENOUS

## 2015-02-20 MED ORDER — DIPHENHYDRAMINE HCL 50 MG/ML IJ SOLN: 12.5000 mg | INTRAMUSCULAR | Status: DC | PRN

## 2015-02-20 MED ORDER — OXYTOCIN 40 UNITS IN LACTATED RINGERS INFUSION - SIMPLE MED: 62.5000 mL/h | INTRAVENOUS | Status: AC

## 2015-02-20 MED ORDER — CEFAZOLIN SODIUM-DEXTROSE 2-3 GM-% IV SOLR
INTRAVENOUS | Status: AC
  Filled 2015-02-20: qty 50

## 2015-02-20 MED ORDER — SIMETHICONE 80 MG PO CHEW
80.0000 mg | CHEWABLE_TABLET | ORAL | Status: DC | PRN
  Administered 2015-02-21: 80 mg via ORAL

## 2015-02-20 MED ORDER — OXYTOCIN 10 UNIT/ML IJ SOLN
40.0000 [IU] | INTRAVENOUS | Status: DC | PRN
  Administered 2015-02-20: 40 [IU] via INTRAVENOUS

## 2015-02-20 MED ORDER — SIMETHICONE 80 MG PO CHEW
80.0000 mg | CHEWABLE_TABLET | ORAL | Status: DC
  Administered 2015-02-21 (×2): 80 mg via ORAL
  Filled 2015-02-20 (×2): qty 1

## 2015-02-20 MED ORDER — NALOXONE HCL 0.4 MG/ML IJ SOLN: 0.4000 mg | INTRAMUSCULAR | Status: DC | PRN

## 2015-02-20 NOTE — Lactation Note (Signed)
This note was copied from the chart of Boy Mica Ramdass. Lactation Consultation Note  Patient Name: Boy Kelsy Polack [id number] Date: 02/20/2015  Pecola Leisure  is 18 hours old and has been to the breast several times with the use of #24 NS ( set up 11-7 by the [facility] RN ). Per mom baby recently fed at 530 p for 30 mins both breast .  Presently sound asleep in crib.  LC re-sized mom for the #24 NS , and felt it was a boarder line fit for her semi flat nipples .  LC recommended prior to latch with the NS - breast massage , hand express, pre- pump with a hand pump  To make the nipple/ areola complex more elastic so the NS will fit better , latch if EBM available instill into the top with a curved tip syringe  And latch with firm support and feed offering both breast. Post pump 10-15 mins after latch - save milk with top .  LC instructed mom on the use of shells also and showed her how to use them. Areola semi - compressible at this point , needs to work with shells and pumping. LC reassured mom the tools will help.  @ consult LC assisted and set up hand pump , DEBP and had mom both breast ( mom still pumping on premie mode when LC finished )  Shells , cleaning of all tools. Mother informed of post-discharge support and given phone number to the lactation department, including services for phone call assistance; out-patient appointments; and breastfeeding support group. List of other breastfeeding resources in the community given in the handout. Encouraged mother to call for problems or concerns related to breastfeeding.    Maternal Data    Feeding Feeding Type: Breast Fed Length of feed: 30 min (per mom, mom reports milk in the NS )  LATCH Score/Interventions                      Lactation Tools Discussed/Used Nipple shield size: 24 (NS started by [facility] RN on 11-7 shift )   Consult Status      Kathrin Greathouse 02/20/2015, 7:30 PM

## 2015-02-20 NOTE — Anesthesia Postprocedure Evaluation (Signed)
  Anesthesia Post-op Note  Patient: Nicole Brooks  Procedure(s) Performed: Procedure(s): CESAREAN SECTION (N/A)  Patient Location: PACU  Anesthesia Type:Epidural  Level of Consciousness: awake, alert  and oriented  Airway and Oxygen Therapy: Patient Spontanous Breathing  Post-op Pain: mild  Post-op Assessment: Patient's Cardiovascular Status Stable, Respiratory Function Stable, No signs of Nausea or vomiting, Adequate PO intake, Pain level controlled, No headache, No backache and Patient able to bend at knees LLE Motor Response: Purposeful movement LLE Sensation: Tingling RLE Motor Response: Purposeful movement RLE Sensation: Tingling L Sensory Level: S1-Sole of foot, small toes R Sensory Level: S1-Sole of foot, small toes  Post-op Vital Signs: Reviewed and stable  Last Vitals:  Filed Vitals:   02/20/15 0900  BP: 119/72  Pulse: 65  Temp: 36.7 C  Resp: 17    Complications: No apparent anesthesia complications

## 2015-02-20 NOTE — Op Note (Signed)
Cesarean Section Operative Report  Nicole Brooks  02/18/2015 - 02/20/2015  Indications: nonreassuring fetal heart tones, arrest of first stage  Pre-operative Diagnosis: Arrest of labor and non reassuring fetal heart rate .   Post-operative Diagnosis: Same   Surgeon: Surgeon(s) and Role:    * Levie Heritage, DO - Primary    * Kathrynn Running, MD - Assisting   Attending Attestation: I was present and scrubbed for the entire procedure.   Assistants: none  Anesthesia: epidural    Estimated Blood Loss: 750 ml   Total IV Fluids: 1500 ml LR   Urine Output: 100 cc pink-tinged urine (patient began procedure with red-tinged urine and urine cleared somewhat during the procedure)  Specimens: placenta to pathology  Findings: Viable female infant in vertex OP presentation. Apgars 8/9, weight pending. Arterial cord pH 7.288. Clear amniotic fluid. Intact placenta, three vessel cord, true knot, nuchal x2. Normal uterus, fallopian tubes and ovaries bilaterally.  Baby condition / location:  Couplet care / Skin to Skin    Complications: no complications  Indications: Nicole Brooks is a 29 y.o. G2P0010 with an IUP [redacted]w[redacted]d presenting in spontaneous labor. See progress note for detailed discussion of indication for cesarean section. Briefly, patient required pitocin for augmentation and experienced recurrent late decels with inadequate contractions and 8 hours of no cervical change.  The risks, benefits, complications, treatment options, and expected outcomes were discussed with the patient . The patient concurred with the proposed plan, giving informed consent. identified as Nicole Brooks and the procedure verified as C-Section Delivery.  Procedure Details: A Time Out was held and the above information confirmed.  The patient was taken back to the operative suite where epidural anesthesia was dosed.  After induction of anesthesia, the patient was draped and prepped in the usual sterile manner  and placed in a dorsal supine position with a leftward tilt. A Phannensteil incision was made and carried down through the subcutaneous tissue to the fascia. Fascial incision was made and extended transversely. The fascia was separated from the underlying rectus tissue superiorly and inferiorly. The peritoneum was identified and entered bluntly. Alexis retractor placed. A low transverse uterine incision was made. Delivered from cephalic presentation was a viable female infant with Apgar scores of 8 at one minute and 9 at five minutes. Cord ph was sent the umbilical cord was clamped and cut cord blood was obtained for evaluation. The placenta was removed Intact and appeared normal. The uterine outline, tubes and ovaries appeared normal. The uterine incision was closed with running locked sutures of 0Vicryl with an imbricating layer of the same.   Hemostasis was observed. The fascia was then reapproximated with running sutures of 0Vicryl. The skin was closed with 4-0Vicryl.   Instrument, sponge, and needle counts were correct prior the abdominal closure and were correct at the conclusion of the case.     Disposition: PACU - hemodynamically stable.   Maternal Condition: stable       Signed: Cherrie Gauze WoukMD 02/20/2015 1:08 AM

## 2015-02-20 NOTE — Addendum Note (Signed)
Addendum  created 02/20/15 1042 by Elbert Ewings, CRNA   Modules edited: Notes Section   Notes Section:  File: [id number]

## 2015-02-20 NOTE — Transfer of Care (Signed)
Immediate Anesthesia Transfer of Care Note  Patient: Nicole Brooks  Procedure(s) Performed: Procedure(s): CESAREAN SECTION (N/A)  Patient Location: PACU  Anesthesia Type:Epidural  Level of Consciousness: awake, alert , oriented and patient cooperative  Airway & Oxygen Therapy: Patient Spontanous Breathing  Post-op Assessment: Report given to RN, Post -op Vital signs reviewed and stable and Patient moving all extremities X 4  Post vital signs: Reviewed and stable  Last Vitals:  Filed Vitals:   02/19/15 2341  BP: 144/75  Pulse: 97  Temp:   Resp:     Complications: No apparent anesthesia complications

## 2015-02-20 NOTE — Anesthesia Postprocedure Evaluation (Signed)
Anesthesia Post Note  Patient: Nicole Brooks  Procedure(s) Performed: Procedure(s) (LRB): CESAREAN SECTION (N/A)  Anesthesia type: Epidural  Patient location: PACU  Post pain: Pain level controlled  Post assessment: Post-op Vital signs reviewed  Last Vitals:  Filed Vitals:   02/19/15 2341  BP: 144/75  Pulse: 97  Temp:   Resp:     Post vital signs: Reviewed  Level of consciousness: awake  Complications: No apparent anesthesia complications

## 2015-02-21 ENCOUNTER — Encounter (HOSPITAL_COMMUNITY): Payer: Self-pay | Admitting: Family Medicine

## 2015-02-21 MED ORDER — MEASLES, MUMPS & RUBELLA VAC ~~LOC~~ INJ
0.5000 mL | INJECTION | Freq: Once | SUBCUTANEOUS | Status: AC
  Administered 2015-02-22: 0.5 mL via SUBCUTANEOUS
  Filled 2015-02-21: qty 0.5

## 2015-02-21 NOTE — Progress Notes (Signed)
Post Partum Day 1 Subjective: no complaints, voiding, tolerating PO and + flatus  Objective: Blood pressure 128/75, pulse 84, temperature 98.2 F (36.8 C), temperature source Oral, resp. rate 18, height 5' (1.524 m), weight 94.802 kg (209 lb), last menstrual period 04/24/2014, SpO2 100 %, unknown if currently breastfeeding.  Physical Exam:  General: alert and cooperative Lochia: appropriate Uterine Fundus: firm Incision: no significant drainage DVT Evaluation: No evidence of DVT seen on physical exam.   Recent Labs  02/19/15 0050 02/20/15 0520  HGB 11.1* 10.2*  HCT 34.0* 32.1*    Assessment/Plan: Plan for discharge tomorrow, Breastfeeding, Circumcision prior to discharge and Contraception depo   LOS: 2 days   Nicole Brooks 02/21/2015, 8:07 AM

## 2015-02-22 ENCOUNTER — Inpatient Hospital Stay (HOSPITAL_COMMUNITY): Admission: RE | Admit: 2015-02-22 | Payer: 59 | Source: Ambulatory Visit

## 2015-02-22 DIAGNOSIS — Z98891 History of uterine scar from previous surgery: Secondary | ICD-10-CM

## 2015-02-22 LAB — CBC
HCT: 27.7 % — ABNORMAL LOW (ref 36.0–46.0)
Hemoglobin: 8.9 g/dL — ABNORMAL LOW (ref 12.0–15.0)
MCH: 27.9 pg (ref 26.0–34.0)
MCHC: 32.1 g/dL (ref 30.0–36.0)
MCV: 86.8 fL (ref 78.0–100.0)
PLATELETS: 225 10*3/uL (ref 150–400)
RBC: 3.19 MIL/uL — AB (ref 3.87–5.11)
RDW: 13.7 % (ref 11.5–15.5)
WBC: 9.7 10*3/uL (ref 4.0–10.5)

## 2015-02-22 MED ORDER — OXYCODONE-ACETAMINOPHEN 5-325 MG PO TABS: 1.0000 | ORAL_TABLET | ORAL | Status: DC | PRN

## 2015-02-22 MED ORDER — IBUPROFEN 600 MG PO TABS: 600.0000 mg | ORAL_TABLET | Freq: Four times a day (QID) | ORAL | Status: DC | PRN

## 2015-02-22 NOTE — Discharge Instructions (Signed)

## 2015-02-22 NOTE — Discharge Summary (Signed)
OB Discharge Summary     Patient Name: Nicole Brooks DOB: 14-Mar-1986 MRN: [id number]  Date of admission: 02/18/2015 Delivering MD:     Date of discharge: 02/22/2015  Admitting diagnosis: 40W CTX  2 MIN APART,PRESSURE Intrauterine pregnancy: [redacted]w[redacted]d        Discharge diagnosis: Term Pregnancy Delivered                                                                                              Complications: None  Hospital course:  Onset of Labor With Unplanned C/S  29y.o. yo G2P1011 at 48w5das admitted in Latent Labor on 02/18/2015. Patient had a labor course significant for fetal intolerance. Membrane Rupture Time/Date: 11:55 AM ,02/19/2015   The patient went for cesarean section due to Non-Reassuring FHR, and delivered a Viable infant,02/20/2015  Details of operation can be found in separate operative note. Patient had an uncomplicated postpartum course.  She is ambulating,tolerating a regular diet, passing flatus, and urinating well.  Patient is discharged home in stable condition No discharge date for patient encounter.. Marland Kitchen Physical exam  Filed Vitals:   02/21/15 0656709/21/16 1839 02/21/15 1900 02/22/15 0615  BP: 128/75  123/90 128/73  Pulse: 84  73 76  Temp: 98.2 F (36.8 C) 99.2 F (37.3 C) 97.2 F (36.2 C) 98 F (36.7 C)  TempSrc: Oral  Oral Oral  Resp: _0 Height:      Weight:      SpO2:       General: alert, cooperative and no distress Lochia: appropriate Uterine Fundus: firm Incision: Healing well with no significant drainage, No significant erythema, Dressing is clean, dry, and intact DVT Evaluation: No evidence of DVT seen on physical exam. No cords or calf tenderness. No significant calf/ankle edema. Labs: Lab Results  Component Value Date   WBC 9.7 02/22/2015   HGB 8.9* 02/22/2015   HCT 27.7* 02/22/2015   MCV 86.8 02/22/2015   PLT 225 02/22/2015   CMP Latest Ref Rng 07/02/2013  Glucose 70 - 99 mg/dL 92  BUN 6 - 23 mg/dL 14  Creatinine 0.50  - 1.10 mg/dL 0.58  Sodium 137 - 147 mEq/L 140  Potassium 3.7 - 5.3 mEq/L 4.0  Chloride 96 - 112 mEq/L 102  CO2 19 - 32 mEq/L 26  Calcium 8.4 - 10.5 mg/dL 9.5    Discharge instruction: per After Visit Summary and "Baby and Me Booklet".  Medications:  Current facility-administered medications:  .  acetaminophen (TYLENOL) tablet 650 mg, 650 mg, Oral, Q4H PRN, NoGwynne EdingerMD .  witch hazel-glycerin (TUCKS) pad 1 application, 1 application, Topical, PRN **AND** dibucaine (NUPERCAINAL) 1 % rectal ointment 1 application, 1 application, Rectal, PRN, NoGwynne EdingerMD .  diphenhydrAMINE (BENADRYL) injection 12.5 mg, 12.5 mg, Intravenous, Q4H PRN **OR** diphenhydrAMINE (BENADRYL) capsule 25 mg, 25 mg, Oral, Q4H PRN, FrLyn HollingsheadMD .  diphenhydrAMINE (BENADRYL) capsule 25 mg, 25 mg, Oral, Q6H PRN, NoGwynne EdingerMD .  ibuprofen (ADVIL,MOTRIN) tablet 600 mg, 600 mg, Oral, Q6H PRN, FrLyn HollingsheadMD .  ibuprofen (ADVIL,MOTRIN)  tablet 600 mg, 600 mg, Oral, 4 times per day, Gwynne Edinger, MD, 600 mg at 02/22/15 0526 .  lactated ringers infusion, 125 mL/hr, Intravenous, Continuous, Truett Mainland, DO, Last Rate: 125 mL/hr at 02/20/15 0949, 125 mL/hr at 02/20/15 0949 .  lactated ringers infusion, , Intravenous, Continuous, Gwynne Edinger, MD .  lanolin ointment 1 application, 1 application, Topical, PRN, Gwynne Edinger, MD .  measles, mumps and rubella vaccine (MMR) injection 0.5 mL, 0.5 mL, Subcutaneous, Once, Jacob J Stinson, DO .  menthol-cetylpyridinium (CEPACOL) lozenge 3 mg, 1 lozenge, Oral, Q2H PRN, Gwynne Edinger, MD .  nalbuphine (NUBAIN) injection 5 mg, 5 mg, Intravenous, Q4H PRN **OR** nalbuphine (NUBAIN) injection 5 mg, 5 mg, Subcutaneous, Q4H PRN, Lyn Hollingshead, MD .  nalbuphine (NUBAIN) injection 5 mg, 5 mg, Intravenous, Once PRN **OR** nalbuphine (NUBAIN) injection 5 mg, 5 mg, Subcutaneous, Once PRN, Lyn Hollingshead, MD .  naloxone (NARCAN) 2 mg in  dextrose 5 % 250 mL infusion, 1-4 mcg/kg/hr, Intravenous, Continuous PRN, Lyn Hollingshead, MD .  naloxone [facility]) injection 0.4 mg, 0.4 mg, Intravenous, PRN **AND** sodium chloride 0.9 % injection 3 mL, 3 mL, Intravenous, PRN, Lyn Hollingshead, MD .  ondansetron (ZOFRAN) injection 4 mg, 4 mg, Intravenous, Q8H PRN, Lyn Hollingshead, MD .  oxyCODONE-acetaminophen (PERCOCET/ROXICET) 5-325 MG per tablet 1 tablet, 1 tablet, Oral, Q4H PRN, Gwynne Edinger, MD, 1 tablet at 02/21/15 1544 .  oxyCODONE-acetaminophen (PERCOCET/ROXICET) 5-325 MG per tablet 2 tablet, 2 tablet, Oral, Q4H PRN, Gwynne Edinger, MD, 2 tablet at 02/21/15 1931 .  prenatal multivitamin tablet 1 tablet, 1 tablet, Oral, Q1200, Gwynne Edinger, MD, 1 tablet at 02/21/15 1243 .  scopolamine (TRANSDERM-SCOP) 1 MG/3DAYS 1.5 mg, 1 patch, Transdermal, Once, Lyn Hollingshead, MD .  senna-docusate (Senokot-S) tablet 2 tablet, 2 tablet, Oral, Q24H, Gwynne Edinger, MD, 2 tablet at 02/21/15 2336 .  simethicone (MYLICON) chewable tablet 80 mg, 80 mg, Oral, TID PC, Gwynne Edinger, MD, 80 mg at 02/22/15 0956 .  simethicone (MYLICON) chewable tablet 80 mg, 80 mg, Oral, Q24H, Gwynne Edinger, MD, 80 mg at 02/21/15 2336 .  simethicone (MYLICON) chewable tablet 80 mg, 80 mg, Oral, PRN, Gwynne Edinger, MD, 80 mg at 02/21/15 1545 .  Tdap (BOOSTRIX) injection 0.5 mL, 0.5 mL, Intramuscular, Once, [facility], MD .  zolpidem [facility]) tablet 5 mg, 5 mg, Oral, QHS PRN, Gwynne Edinger, MD  Diet: routine diet  Activity: Advance as tolerated. Pelvic rest for 6 weeks.   Outpatient follow up:6 weeks  Postpartum contraception: Depo Provera  Newborn Data: Live born female  Birth Weight: 6 lb 11.8 oz (3055 g) APGAR: 8, 9  Baby Feeding: Breast Disposition:home with mother   02/22/2015 Adin Hector, MD  CNM attestation I have seen and examined this patient and agree with above documentation in the resident's note.    Nicole Brooks is a 29 y.o. G2P1011 s/p pLTCS.   Pain is well controlled.  Plan for birth control is Depo-Provera.  Method of Feeding: breast  PE:  BP 128/73 mmHg  Pulse 76  Temp(Src) 98 F (36.7 C) (Oral)  Resp 20  Ht 5' (1.524 m)  Wt 94.802 kg (209 lb)  BMI 40.82 kg/m2  SpO2 100%  LMP 04/24/2014  Breastfeeding? Unknown Fundus firm  No results for input(s): HGB, HCT in the last 72 hours.   Plan: discharge today - postpartum care discussed - f/u clinic in 6 weeks for postpartum visit  Serita Grammes, CNM 8:47 AM 03/01/2015

## 2015-03-30 ENCOUNTER — Ambulatory Visit (INDEPENDENT_AMBULATORY_CARE_PROVIDER_SITE_OTHER): Payer: 59 | Admitting: Family Medicine

## 2015-03-30 ENCOUNTER — Encounter: Payer: Self-pay | Admitting: Family Medicine

## 2015-03-30 NOTE — Progress Notes (Signed)
Patient ID: Nicole Brooks, female   DOB: 1985-07-17, 29 y.o.   MRN: [id number] Subjective:     Nicole Brooks is a 29 y.o. female who presents for a postpartum visit. She is 5 weeks postpartum following a low cervical transverse Cesarean section. I have fully reviewed the prenatal and intrapartum course. The delivery was at 40.5 gestational weeks. Outcome: primary cesarean section, low transverse incision. Anesthesia: epidural. Postpartum course has been unremarkable. Baby's course has been unremarkable. Baby is feeding by bottle - Similac with Iron. Bleeding staining only. Bowel function is normal. Bladder function is normal. Patient is sexually active. Contraception method is Depo-Provera injections. Postpartum depression screening: negative.  Patient had unprotected intercourse 2 days ago  The following portions of the patient's history were reviewed and updated as appropriate: allergies, current medications, past family history, past medical history, past social history, past surgical history and problem list.  Review of Systems Pertinent items are noted in HPI.   Objective:    BP 113/64 mmHg  Pulse 70  Temp(Src) 98.6 F (37 C)  Ht 5' (1.524 m)  Wt 186 lb (84.369 kg)  BMI 36.33 kg/m2  Breastfeeding? No  General:  alert, cooperative and no distress     Lungs: clear to auscultation bilaterally  Heart:  regular rate and rhythm, S1, S2 normal, no murmur, click, rub or gallop  Abdomen: soft, non-tender; bowel sounds normal; no masses,  no organomegaly.  Incision well healed        Assessment:     Normal postpartum exam. Pap smear not done at today's visit.   Plan:    1. Contraception: Depo-Provera injections in two weeks.  Instructed patient to use condoms over next two weeks. 2. Follow up in: 2 weeks or as needed.

## 2015-03-30 NOTE — Patient Instructions (Signed)
Postpartum Depression and Baby Blues °The postpartum period begins right after the birth of a baby. During this time, there is often a great amount of joy and excitement. It is also a time of many changes in the life of the parents. Regardless of how many times a mother gives birth, each child brings new challenges and dynamics to the family. It is not unusual to have feelings of excitement along with confusing shifts in moods, emotions, and thoughts. All mothers are at risk of developing postpartum depression or the "baby blues." These mood changes can occur right after giving birth, or they may occur many months after giving birth. The baby blues or postpartum depression can be mild or severe. Additionally, postpartum depression can go away rather quickly, or it can be a long-term condition.  °CAUSES °Raised hormone levels and the rapid drop in those levels are thought to be a main cause of postpartum depression and the baby blues. A number of hormones change during and after pregnancy. Estrogen and progesterone usually decrease right after the delivery of your baby. The levels of thyroid hormone and various cortisol steroids also rapidly drop. Other factors that play a role in these mood changes include major life events and genetics.  °RISK FACTORS °If you have any of the following risks for the baby blues or postpartum depression, know what symptoms to watch out for during the postpartum period. Risk factors that may increase the likelihood of getting the baby blues or postpartum depression include: °· Having a personal or family history of depression.   °· Having depression while being pregnant.   °· Having premenstrual mood issues or mood issues related to oral contraceptives. °· Having a lot of life stress.   °· Having marital conflict.   °· Lacking a social support network.   °· Having a baby with special needs.   °· Having health problems, such as diabetes.   °SIGNS AND SYMPTOMS °Symptoms of baby blues  include: °· Brief changes in mood, such as going from extreme happiness to sadness. °· Decreased concentration.   °· Difficulty sleeping.   °· Crying spells, tearfulness.   °· Irritability.   °· Anxiety.   °Symptoms of postpartum depression typically begin within the first month after giving birth. These symptoms include: °· Difficulty sleeping or excessive sleepiness.   °· Marked weight loss.   °· Agitation.   °· Feelings of worthlessness.   °· Lack of interest in activity or food.   °Postpartum psychosis is a very serious condition and can be dangerous. Fortunately, it is rare. Displaying any of the following symptoms is cause for immediate medical attention. Symptoms of postpartum psychosis include:  °· Hallucinations and delusions.   °· Bizarre or disorganized behavior.   °· Confusion or disorientation.   °DIAGNOSIS  °A diagnosis is made by an evaluation of your symptoms. There are no medical or lab tests that lead to a diagnosis, but there are various questionnaires that a health care provider may use to identify those with the baby blues, postpartum depression, or psychosis. Often, a screening tool called the Edinburgh Postnatal Depression Scale is used to diagnose depression in the postpartum period.  °TREATMENT °The baby blues usually goes away on its own in 1-2 weeks. Social support is often all that is needed. You will be encouraged to get adequate sleep and rest. Occasionally, you may be given medicines to help you sleep.  °Postpartum depression requires treatment because it can last several months or longer if it is not treated. Treatment may include individual or group therapy, medicine, or both to address any social, physiological, and psychological   factors that may play a role in the depression. Regular exercise, a healthy diet, rest, and social support may also be strongly recommended.  °Postpartum psychosis is more serious and needs treatment right away. Hospitalization is often needed. °HOME CARE  INSTRUCTIONS °· Get as much rest as you can. Nap when the baby sleeps.   °· Exercise regularly. Some women find yoga and walking to be beneficial.   °· Eat a balanced and nourishing diet.   °· Do little things that you enjoy. Have a cup of tea, take a bubble bath, read your favorite magazine, or listen to your favorite music. °· Avoid alcohol.   °· Ask for help with household chores, cooking, grocery shopping, or running errands as needed. Do not try to do everything.   °· Talk to people close to you about how you are feeling. Get support from your partner, family members, friends, or other new moms. °· Try to stay positive in how you think. Think about the things you are grateful for.   °· Do not spend a lot of time alone.   °· Only take over-the-counter or prescription medicine as directed by your health care provider. °· Keep all your postpartum appointments.   °· Let your health care provider know if you have any concerns.   °SEEK MEDICAL CARE IF: °You are having a reaction to or problems with your medicine. °SEEK IMMEDIATE MEDICAL CARE IF: °· You have suicidal feelings.   °· You think you may harm the baby or someone else. °MAKE SURE YOU: °· Understand these instructions. °· Will watch your condition. °· Will get help right away if you are not doing well or get worse. °  °This information is not intended to replace advice given to you by your health care provider. Make sure you discuss any questions you have with your health care provider. °  °Document Released: 02/21/2004 Document Revised: 05/24/2013 Document Reviewed: 02/28/2013 °Elsevier Interactive Patient Education ©2016 [employer] ° °

## 2015-03-30 NOTE — Progress Notes (Deleted)
   Subjective:    Patient ID: Nicole DadeMia L Porta, female    DOB: 05-18-1986, 29 y.o.   MRN: [id number]  HPI  Seen for heavy bleeding and anemia - Started having several periods a month since beginning of the year.  Has always had heavy bleeding.  Will bleed for 3-4 days, then stop for a few days, then bleed again.  Has tried IUD, depo in the past and had side effects or worse bleeding.  Review of Systems     Objective:   Physical Exam        Assessment & Plan:

## 2015-04-02 ENCOUNTER — Ambulatory Visit: Payer: 59 | Admitting: Medical

## 2015-04-16 ENCOUNTER — Ambulatory Visit: Payer: 59

## 2015-04-16 DIAGNOSIS — Z30013 Encounter for initial prescription of injectable contraceptive: Secondary | ICD-10-CM

## 2015-04-16 LAB — POCT PREGNANCY, URINE: Preg Test, Ur: NEGATIVE

## 2015-04-16 NOTE — Progress Notes (Signed)
Pt here today for Depo Provera with pregnancy test due to having unprotected sex prior to pp visit on 03/30/15.  Pt informed me that she has continued to have unprotected sex even after the recommendation of abstinence or use of condoms for two weeks before receiving Depo Provera.  I explained to the pt that it is important that she follows recommendations.  Pt also given some condoms to use.  Pt stated understanding with no further questions.

## 2015-04-30 ENCOUNTER — Ambulatory Visit: Payer: 59

## 2015-05-02 ENCOUNTER — Ambulatory Visit (INDEPENDENT_AMBULATORY_CARE_PROVIDER_SITE_OTHER): Payer: 59 | Admitting: *Deleted

## 2015-05-02 DIAGNOSIS — Z3042 Encounter for surveillance of injectable contraceptive: Secondary | ICD-10-CM | POA: Diagnosis not present

## 2015-05-02 LAB — POCT PREGNANCY, URINE: PREG TEST UR: NEGATIVE

## 2015-05-02 MED ORDER — MEDROXYPROGESTERONE ACETATE 150 MG/ML IM SUSP
150.0000 mg | Freq: Once | INTRAMUSCULAR | Status: AC
  Administered 2015-05-02: 150 mg via INTRAMUSCULAR

## 2015-07-18 ENCOUNTER — Ambulatory Visit (INDEPENDENT_AMBULATORY_CARE_PROVIDER_SITE_OTHER): Payer: Medicaid Other | Admitting: General Practice

## 2015-07-18 VITALS — BP 121/79 | HR 74 | Temp 98.9°F | Ht 60.0 in | Wt 189.0 lb

## 2015-07-18 DIAGNOSIS — Z3042 Encounter for surveillance of injectable contraceptive: Secondary | ICD-10-CM | POA: Diagnosis not present

## 2015-07-18 MED ORDER — MEDROXYPROGESTERONE ACETATE 150 MG/ML IM SUSP
150.0000 mg | Freq: Once | INTRAMUSCULAR | Status: AC
  Administered 2015-07-18: 150 mg via INTRAMUSCULAR

## 2015-10-03 ENCOUNTER — Ambulatory Visit: Payer: Medicaid Other

## 2015-10-10 ENCOUNTER — Ambulatory Visit (INDEPENDENT_AMBULATORY_CARE_PROVIDER_SITE_OTHER): Payer: Medicaid Other | Admitting: *Deleted

## 2015-10-10 VITALS — BP 107/66 | HR 72 | Wt 186.0 lb

## 2015-10-10 DIAGNOSIS — Z3042 Encounter for surveillance of injectable contraceptive: Secondary | ICD-10-CM | POA: Diagnosis not present

## 2015-10-10 MED ORDER — MEDROXYPROGESTERONE ACETATE 150 MG/ML IM SUSP
150.0000 mg | Freq: Once | INTRAMUSCULAR | Status: AC
  Administered 2015-10-10: 150 mg via INTRAMUSCULAR

## 2015-11-05 ENCOUNTER — Ambulatory Visit: Payer: Medicaid Other | Admitting: Family Medicine

## 2016-06-02 NOTE — L&D Delivery Note (Signed)
Delivery Note At 6:22 PM a viable female was delivered via VBAC, Spontaneous (Presentation: DOA).  APGAR: 8, 9; weight pending.   Placenta status: Spontaneous, intact.  Cord: 3 vessels with the following complications: nuchal x1, delivered through and easily reduced.   Anesthesia:  epidural Episiotomy: None Lacerations: 2nd degree;Perineal;1st degree;Vaginal Suture Repair: 2.0 vicryl rapide Est. Blood Loss (mL):  250  Mom to postpartum.  Baby to Couplet care / Skin to Skin.  Rolm BookbinderCaroline M Shannen Flansburg CNM 04/04/2017, 6:54 PM  Please schedule this patient for PP visit in: 4 weeks Low risk pregnancy complicated by: TOLAC Delivery mode:  SVD Anticipated Birth Control:  BTL done PP PP Procedures needed: n/a  Schedule Integrated BH visit: no Provider: Any provider

## 2016-08-18 ENCOUNTER — Encounter (HOSPITAL_COMMUNITY): Payer: Self-pay | Admitting: Family Medicine

## 2016-08-18 ENCOUNTER — Ambulatory Visit (HOSPITAL_COMMUNITY)
Admission: EM | Admit: 2016-08-18 | Discharge: 2016-08-18 | Disposition: A | Discharge status: Home / Self Care | Payer: Medicaid Other | Attending: Family Medicine | Admitting: Family Medicine

## 2016-08-18 DIAGNOSIS — G5601 Carpal tunnel syndrome, right upper limb: Secondary | ICD-10-CM

## 2016-08-18 MED ORDER — NAPROXEN 500 MG PO TABS: 500.0000 mg | ORAL_TABLET | Freq: Two times a day (BID) | ORAL | 0 refills | Status: DC

## 2016-08-18 NOTE — ED Provider Notes (Signed)
CSN: [id number]     Arrival date & time 08/18/16  1101 History   First MD Initiated Contact with Patient 08/18/16 1202     Chief Complaint  Patient presents with  . Arm Pain   (Consider location/radiation/quality/duration/timing/severity/associated sxs/prior Treatment) Patient c/o right arm pain and right wrist pain.  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 She has been having numbness and tingling in right fingers.   The history is provided by the patient.  Arm Pain  This is a new problem. The problem occurs constantly. The problem has not changed since onset.Nothing aggravates the symptoms.    Past Medical History:  Diagnosis Date  . Asthma   . Cardiomegaly    Past Surgical History:  Procedure Laterality Date  . CESAREAN SECTION N/A 02/19/2015   Procedure: CESAREAN SECTION;  Surgeon: Levie HeritageJacob J Stinson, DO;  Location: [facility];  Service: Obstetrics;  Laterality: N/A;  . DILATION AND EVACUATION  05/17/2012   Procedure: DILATATION AND EVACUATION;  Surgeon: Willodean Rosenthalarolyn Harraway-Smith, MD;  Location: [facility];  Service: Gynecology;  Laterality: N/A;  . DILATION AND EVACUATION  05/17/2012   Family History  Problem Relation Age of Onset  . Diabetes Mother   . Hypertension Mother    Social History  Substance Use Topics  . Smoking status: Never Smoker  . Smokeless tobacco: Never Used  . Alcohol use No   OB History    Gravida Para Term Preterm AB Living   2 1 1  0 1 1   SAB TAB Ectopic Multiple Live Births   1 0 0 0 1     Review of Systems  Constitutional: Negative.   HENT: Negative.   Eyes: Negative.   Respiratory: Negative.   Cardiovascular: Negative.   Gastrointestinal: Negative.   Endocrine: Negative.   Genitourinary: Negative.   Musculoskeletal: Positive for arthralgias and myalgias.  Skin: Negative.   Allergic/Immunologic: Negative.   Neurological: Positive for numbness.  Hematological: Negative.   Psychiatric/Behavioral: Negative.      Allergies  Patient has no known allergies.  Home Medications   Prior to Admission medications   Medication Sig Start Date End Date Taking? Authorizing Provider  naproxen (NAPROSYN) 500 MG tablet Take 1 tablet (500 mg total) by mouth 2 (two) times daily with a meal. 08/18/16   Deatra CanterWilliam J Oxford, FNP   Meds Ordered and Administered this Visit  Medications - No data to display  BP 123/80   Pulse 76   Temp 98.4 F (36.9 C)   Resp 18   LMP 08/18/2016   SpO2 100%  No data found.   Physical Exam  Constitutional: She is oriented to person, place, and time. She appears well-developed and well-nourished.  HENT:  Head: Normocephalic and atraumatic.  Eyes: Conjunctivae and EOM are normal. Pupils are equal, round, and reactive to light.  Neck: Normal range of motion. Neck supple.  Cardiovascular: Normal rate, regular rhythm and normal heart sounds.   Pulmonary/Chest: Effort normal and breath sounds normal.  Musculoskeletal: She exhibits tenderness.  Positive Phalens and Tinnels right wrist.  Neurological: She is alert and oriented to person, place, and time.  Nursing note and vitals reviewed.   Urgent Care Course     Procedures (including critical care time)  Labs Review Labs Reviewed - No data to display  Imaging Review No results found.   Visual Acuity Review  Right Eye Distance:   Left Eye Distance:   Bilateral Distance:  Right Eye Near:   Left Eye Near:    Bilateral Near:         MDM   1. Carpal tunnel syndrome of right wrist    Right wrist splint Naprosyn 500mg  one po bid x 10 days #20      Deatra Canter, FNP 08/18/16 1225

## 2016-08-18 NOTE — ED Triage Notes (Signed)
Pt here for right arm pain. sts that the pain radiated from hand all the way to right shoulder. Hx of carpal tunnel. 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

## 2016-08-31 ENCOUNTER — Encounter (HOSPITAL_COMMUNITY): Payer: Self-pay

## 2016-08-31 ENCOUNTER — Emergency Department (HOSPITAL_COMMUNITY): Payer: Medicaid Other

## 2016-08-31 ENCOUNTER — Emergency Department (HOSPITAL_COMMUNITY)
Admission: EM | Admit: 2016-08-31 | Discharge: 2016-08-31 | Disposition: A | Discharge status: Home / Self Care | Payer: Medicaid Other | Attending: Emergency Medicine | Admitting: Emergency Medicine

## 2016-08-31 DIAGNOSIS — J45909 Unspecified asthma, uncomplicated: Secondary | ICD-10-CM | POA: Diagnosis not present

## 2016-08-31 DIAGNOSIS — Z3A01 Less than 8 weeks gestation of pregnancy: Secondary | ICD-10-CM | POA: Diagnosis not present

## 2016-08-31 DIAGNOSIS — O26891 Other specified pregnancy related conditions, first trimester: Secondary | ICD-10-CM | POA: Insufficient documentation

## 2016-08-31 DIAGNOSIS — O0281 Inappropriate change in quantitative human chorionic gonadotropin (hCG) in early pregnancy: Secondary | ICD-10-CM | POA: Insufficient documentation

## 2016-08-31 DIAGNOSIS — Z79899 Other long term (current) drug therapy: Secondary | ICD-10-CM | POA: Insufficient documentation

## 2016-08-31 DIAGNOSIS — R51 Headache: Secondary | ICD-10-CM | POA: Insufficient documentation

## 2016-08-31 LAB — BASIC METABOLIC PANEL
ANION GAP: 7 (ref 5–15)
BUN: 14 mg/dL (ref 6–20)
CO2: 24 mmol/L (ref 22–32)
Calcium: 9.2 mg/dL (ref 8.9–10.3)
Chloride: 107 mmol/L (ref 101–111)
Creatinine, Ser: 0.62 mg/dL (ref 0.44–1.00)
GFR calc Af Amer: >60 mL/min (ref >60)
GLUCOSE: 97 mg/dL (ref 65–99)
POTASSIUM: 3 mmol/L — AB (ref 3.5–5.1)
Sodium: 138 mmol/L (ref 135–145)

## 2016-08-31 LAB — URINALYSIS, ROUTINE W REFLEX MICROSCOPIC
Bilirubin Urine: NEGATIVE
Glucose, UA: NEGATIVE mg/dL
Hgb urine dipstick: NEGATIVE
Ketones, ur: 80 mg/dL — AB
LEUKOCYTES UA: NEGATIVE
NITRITE: NEGATIVE
PH: 5 (ref 5.0–8.0)
Protein, ur: NEGATIVE mg/dL
SPECIFIC GRAVITY, URINE: 1.025 (ref 1.005–1.030)

## 2016-08-31 LAB — CBC
HEMATOCRIT: 33.6 % — AB (ref 36.0–46.0)
HEMOGLOBIN: 10.8 g/dL — AB (ref 12.0–15.0)
MCH: 27.8 pg (ref 26.0–34.0)
MCHC: 32.1 g/dL (ref 30.0–36.0)
MCV: 86.6 fL (ref 78.0–100.0)
Platelets: 284 10*3/uL (ref 150–400)
RBC: 3.88 MIL/uL (ref 3.87–5.11)
RDW: 13.4 % (ref 11.5–15.5)
WBC: 7.8 10*3/uL (ref 4.0–10.5)

## 2016-08-31 LAB — HCG, QUANTITATIVE, PREGNANCY: HCG, BETA CHAIN, QUANT, S: 87328 m[IU]/mL — AB (ref <5)

## 2016-08-31 LAB — CBG MONITORING, ED: Glucose-Capillary: 112 mg/dL — ABNORMAL HIGH (ref 65–99)

## 2016-08-31 LAB — POC URINE PREG, ED: PREG TEST UR: POSITIVE — AB

## 2016-08-31 MED ORDER — ACETAMINOPHEN 500 MG PO TABS
1000.0000 mg | ORAL_TABLET | Freq: Once | ORAL | Status: AC
  Administered 2016-08-31: 1000 mg via ORAL
  Filled 2016-08-31: qty 2

## 2016-08-31 MED ORDER — SODIUM CHLORIDE 0.9 % IV BOLUS (SEPSIS)
1000.0000 mL | Freq: Once | INTRAVENOUS | Status: AC
  Administered 2016-08-31: 1000 mL via INTRAVENOUS

## 2016-08-31 MED ORDER — PRENATAL COMPLETE 14-0.4 MG PO TABS: 1.0000 | ORAL_TABLET | Freq: Every day | ORAL | 0 refills | Status: DC

## 2016-08-31 MED ORDER — POTASSIUM CHLORIDE CRYS ER 20 MEQ PO TBCR
40.0000 meq | EXTENDED_RELEASE_TABLET | Freq: Once | ORAL | Status: AC
  Administered 2016-08-31: 40 meq via ORAL
  Filled 2016-08-31: qty 2

## 2016-08-31 NOTE — ED Provider Notes (Signed)
[facility]-EMERGENCY DEPT Provider Note   CSN: [id number] Arrival date & time: 08/31/16  1339     History   Chief Complaint Chief Complaint  Patient presents with  . Near Syncope  . Headache    HPI Nicole Brooks is a 31 y.o. female.  HPI   Patient is a 31 year old female with history of asthma and migraines who presents to the ED with multiple complaints. Patient reports this afternoon around 1 PM when she sat up after taking her lunch break at work she began to feel lightheaded. Patient reports when she was seen in the elevator her lightheadedness worsened resulting in her having to sit down to prevent from passing out. Denies any fall, head injury or LOC. Patient reports she is continued to have lightheadedness since onset and notes it is worse when laying supine. Denies history of prior symptoms. Reports having associated headache with episode but denies any other symptoms. Denies visual changes, diaphoresis, chest pain, shortness of breath, nausea, numbness, tingling, weakness. Patient reports having a frontal headache which she describes as sharp pain for the past week. She notes her headache initially was intermittent when it started last week but notes over the past 3 days it has remained constant. Denies any aggravating or alleviating factors. She reports her headache today feels different than her usual migraines. Endorses associated photophobia. Pt denies fever, neck stiffness, visual changes, abdominal pain, N/V, urinary symptoms, numbness, tingling, weakness, seizures, syncope.  Patient states she has been taking Tylenol and ibuprofen at home without relief.  Past Medical History:  Diagnosis Date  . Asthma   . Cardiomegaly     Patient Active Problem List   Diagnosis Date Noted  . S/P cesarean section 02/22/2015  . Not immune to rubella 02/07/2015  . Excess weight gain in pregnancy 01/03/2015  . Pregnancy-related glycosuria 01/03/2015  . Obesity complicating pregnancy in second  trimester   . History of cardiomegaly 08/29/2014  . Supervision of normal pregnancy in first trimester 08/01/2014  . Migraine 05/12/2012    Past Surgical History:  Procedure Laterality Date  . CESAREAN SECTION N/A 02/19/2015   Procedure: CESAREAN SECTION;  Surgeon: Levie Heritage, DO;  Location: [facility];  Service: Obstetrics;  Laterality: N/A;  . DILATION AND EVACUATION  05/17/2012   Procedure: DILATATION AND EVACUATION;  Surgeon: Willodean Rosenthal, MD;  Location: [facility];  Service: Gynecology;  Laterality: N/A;  . DILATION AND EVACUATION  05/17/2012    OB History    Gravida Para Term Preterm AB Living   0 1 1   SAB TAB Ectopic Multiple Live Births   1 0 0 0 1       Home Medications    Prior to Admission medications   Medication Sig Start Date End Date Taking? Authorizing Provider  naproxen (NAPROSYN) 500 MG tablet Take 1 tablet (500 mg total) by mouth 2 (two) times daily with a meal. 08/18/16  Yes Deatra Canter, FNP  Prenatal Vit-Fe Fumarate-FA (PRENATAL COMPLETE) 14-0.4 MG TABS Take 1 tablet by mouth daily. 08/31/16   Barrett Henle, PA-C    Family History Family History  Problem Relation Age of Onset  . Diabetes Mother   . Hypertension Mother     Social History Social History  Substance Use Topics  . Smoking status: Never Smoker  . Smokeless tobacco: Never Used  . Alcohol use No     Allergies   Patient has no known allergies.   Review of Systems Review  of Systems  Eyes: Positive for photophobia.  Neurological: Positive for light-headedness and headaches.  All other systems reviewed and are negative.    Physical Exam Updated Vital Signs BP (!) 103/58 (BP Location: Left Arm)   Pulse 79   Temp 98.7 F (37.1 C) (Oral)   Resp 16   LMP 08/18/2016 (Exact Date)   SpO2 100%   Physical Exam  Constitutional: She is oriented to person, place, and time. She appears well-developed and well-nourished. No distress.  HENT:  Head:  Normocephalic and atraumatic.  Right Ear: Tympanic membrane normal.  Left Ear: Tympanic membrane normal.  Nose: Nose normal. Right sinus exhibits no maxillary sinus tenderness and no frontal sinus tenderness. Left sinus exhibits no maxillary sinus tenderness and no frontal sinus tenderness.  Mouth/Throat: Uvula is midline, oropharynx is clear and moist and mucous membranes are normal. No oropharyngeal exudate, posterior oropharyngeal edema, posterior oropharyngeal erythema or tonsillar abscesses.  Eyes: Conjunctivae and EOM are normal. Pupils are equal, round, and reactive to light. Right eye exhibits no discharge. Left eye exhibits no discharge. No scleral icterus.  Neck: Normal range of motion and full passive range of motion without pain. Neck supple. No spinous process tenderness and no muscular tenderness present. No neck rigidity. Normal range of motion present.  Cardiovascular: Normal rate, regular rhythm, normal heart sounds and intact distal pulses.   Pulmonary/Chest: Effort normal and breath sounds normal. No respiratory distress. She has no wheezes. She has no rales. She exhibits no tenderness.  Abdominal: Soft. Bowel sounds are normal. She exhibits no distension and no mass. There is no tenderness. There is no rebound and no guarding.  Musculoskeletal: Normal range of motion. She exhibits no edema.  No midline C, T, or L tenderness. Full range of motion of neck and back. Full range of motion of bilateral upper and lower extremities, with 5/5 strength. Sensation intact. 2+ radial and PT pulses. Cap refill <2 seconds. Patient able to stand and ambulate without assistance.    Lymphadenopathy:    She has no cervical adenopathy.  Neurological: She is alert and oriented to person, place, and time. She has normal strength. No cranial nerve deficit or sensory deficit. She displays a negative Romberg sign. Coordination normal.  Skin: Skin is warm and dry. She is not diaphoretic.  Nursing note and  vitals reviewed.    ED Treatments / Results  Labs (all labs ordered are listed, but only abnormal results are displayed) Labs Reviewed  BASIC METABOLIC PANEL - Abnormal; Notable for the following:       Result Value   Potassium 3.0 (*)    All other components within normal limits  CBC - Abnormal; Notable for the following:    Hemoglobin 10.8 (*)    HCT 33.6 (*)    All other components within normal limits  URINALYSIS, ROUTINE W REFLEX MICROSCOPIC - Abnormal; Notable for the following:    Ketones, ur 80 (*)    All other components within normal limits  HCG, QUANTITATIVE, PREGNANCY - Abnormal; Notable for the following:    hCG, Beta Chain, Quant, S [id number] (*)    All other components within normal limits  CBG MONITORING, ED - Abnormal; Notable for the following:    Glucose-Capillary 112 (*)    All other components within normal limits  POC URINE PREG, ED - Abnormal; Notable for the following:    Preg Test, Ur POSITIVE (*)    All other components within normal limits    EKG  EKG  Interpretation None       Radiology Ct Head Wo Contrast  Result Date: 08/31/2016 CLINICAL DATA:  Frontal headache for the past week. Near syncopal episode. EXAM: CT HEAD WITHOUT CONTRAST TECHNIQUE: Contiguous axial images were obtained from the base of the skull through the vertex without intravenous contrast. COMPARISON:  None. FINDINGS: Brain: No evidence of acute infarction, hemorrhage, hydrocephalus, extra-axial collection or mass lesion/mass effect. Vascular: No hyperdense vessel or unexpected calcification. Skull: Normal. Negative for fracture or focal lesion. Sinuses/Orbits: No acute finding. Other: None. IMPRESSION: Normal examination. Electronically Signed   By: Beckie Salts M.D.   On: 08/31/2016 15:37    Procedures Procedures (including critical care time)  Medications Ordered in ED Medications  sodium chloride 0.9 % bolus 1,000 mL (0 mLs Intravenous Stopped 08/31/16 1757)  potassium  chloride SA (K-DUR,KLOR-CON) CR tablet 40 mEq (40 mEq Oral Given 08/31/16 1637)  acetaminophen (TYLENOL) tablet 1,000 mg (1,000 mg Oral Given 08/31/16 1640)     Initial Impression / Assessment and Plan / ED Course  I have reviewed the triage vital signs and the nursing notes.  Pertinent labs & imaging results that were available during my care of the patient were reviewed by me and considered in my medical decision making (see chart for details).     Patient presents with intermittent headache for the past week that has remained constant over the past few days. Endorses associated photophobia. Reports headache feels slightly different than her typical headaches. Also reports having episode of lightheadedness that occurred when she stood up and began walking after her lunch break this afternoon. Denies LOC. VSS. Exam unremarkable. No neuro deficits. Negative orthostatics. Patient given IV fluids. EKG and Basic labs ordered for evaluation. CT head ordered due to pt reporting worsening HA that is different than typical migraines. Labs revealed positive pregnancy, hCG Quant 87,000. Potassium 3, patient given oral potassium supplement in the ED. Remaining vital stable. EKG shows sinus rhythm with no acute arrhythmia or ischemia. CT head negative. On reevaluation patient reports improvement of symptoms. I discussed results with patient. Patient continues to deny abdominal pain, vaginal bleeding or discharge. Suspect patient's symptoms are likely associated with first trimester pregnancy. Plan to start patient on prenatals and follow up with OB/GYN this week for further evaluation and management of her pregnancy. Plan to d/c home. Discussed return precautions with patient.  Final Clinical Impressions(s) / ED Diagnoses   Final diagnoses:  Less than [redacted] weeks gestation of pregnancy    New Prescriptions Discharge Medication List as of 08/31/2016  6:02 PM    START taking these medications   Details  Prenatal  Vit-Fe Fumarate-FA (PRENATAL COMPLETE) 14-0.4 MG TABS Take 1 tablet by mouth daily., Starting Sun 08/31/2016, Print         [place], PA-C 08/31/16 1944    Arby Barrette, MD 08/31/16 [phone]

## 2016-08-31 NOTE — Discharge Instructions (Signed)
Take your prenatal vitamins as prescribed. Continue drinking fluids at home to remain hydrated. Call the OB/GYN office visit below tomorrow morning to schedule a follow-up appointment for further management and evaluation of your pregnancy. Return to the emergency department if symptoms worsen or new onset of fever, visual changes, dizziness, neck stiffness, shortness of breath, chest pain, abdominal pain, vomiting, unable to keep fluids down, seemed to be, seizure, vaginal bleeding.

## 2016-08-31 NOTE — ED Triage Notes (Signed)
She reports having a frontal h/a "since last Sunday". She denies any fever/n/v/d/ nor any other sign of current illness. 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 Currently, she is awake, alert and oriented x 4 with clear speech.

## 2016-08-31 NOTE — ED Notes (Signed)
Bed: [facility]14 Expected date:  Expected time:  Means of arrival:  Comments: 

## 2016-08-31 NOTE — ED Notes (Signed)
Pt aware of need for urine specimen. 

## 2016-09-30 ENCOUNTER — Other Ambulatory Visit (HOSPITAL_COMMUNITY): Payer: Self-pay | Admitting: Nurse Practitioner

## 2016-09-30 DIAGNOSIS — Z3481 Encounter for supervision of other normal pregnancy, first trimester: Secondary | ICD-10-CM

## 2016-10-13 ENCOUNTER — Encounter (HOSPITAL_COMMUNITY): Payer: Self-pay | Admitting: Emergency Medicine

## 2016-10-13 DIAGNOSIS — J45909 Unspecified asthma, uncomplicated: Secondary | ICD-10-CM | POA: Diagnosis not present

## 2016-10-13 DIAGNOSIS — H6123 Impacted cerumen, bilateral: Secondary | ICD-10-CM | POA: Insufficient documentation

## 2016-10-13 DIAGNOSIS — Z3A12 12 weeks gestation of pregnancy: Secondary | ICD-10-CM | POA: Insufficient documentation

## 2016-10-13 DIAGNOSIS — O9989 Other specified diseases and conditions complicating pregnancy, childbirth and the puerperium: Secondary | ICD-10-CM | POA: Diagnosis not present

## 2016-10-13 DIAGNOSIS — R6884 Jaw pain: Secondary | ICD-10-CM | POA: Diagnosis not present

## 2016-10-13 NOTE — ED Triage Notes (Signed)
PER EMS:  Pt presents to ED for assessment of right sided jaw pain, top and bottom, worsening with palpation or movement.  Hx of same with previous pregnancy, pt denies any dental abscess at this time.  States she was told previously it was due to her fetus not receiving enough calcium, but then patient had to have two teeth pulled after the pregnancy.  Poor dental hygiene noted.

## 2016-10-14 ENCOUNTER — Emergency Department (HOSPITAL_COMMUNITY)
Admission: EM | Admit: 2016-10-14 | Discharge: 2016-10-14 | Disposition: A | Discharge status: Home / Self Care | Payer: Medicaid Other | Attending: Emergency Medicine | Admitting: Emergency Medicine

## 2016-10-14 DIAGNOSIS — H6123 Impacted cerumen, bilateral: Secondary | ICD-10-CM

## 2016-10-14 MED ORDER — DOCUSATE SODIUM 50 MG/5ML PO LIQD
50.0000 mg | ORAL | Status: AC
  Administered 2016-10-14: 50 mg via OTIC
  Filled 2016-10-14: qty 10

## 2016-10-14 NOTE — Discharge Instructions (Signed)
bilateral cerumen impactions.  The left was easily removed.  The right has not been able to be irrigated or flushed out.  Before discharge, ear again was filled with a softening material.  Please leave this in place tomorrow at which time he can remove the cotton ball and try irrigating it out itself.  If you are unable to irrigate the wax out.  He been given a referral to Dr. Lazarus SalinesWolicki  He ENT specialist.  Please call and make arrangements for an appointment.  Tell him of tonight's visit.

## 2016-10-14 NOTE — ED Notes (Signed)
Ears flushed multiple times. Edp made aware not much results.

## 2016-10-14 NOTE — ED Provider Notes (Signed)
[facility]-EMERGENCY DEPT Provider Note   CSN: [id number] Arrival date & time: 10/13/16  2340     History   Chief Complaint Chief Complaint  Patient presents with  . Jaw Pain    HPI Nicole Brooks is a 31 y.o. female.  This a 31 year old female who is 3 months pregnant.  Reports that she is having upper and lower right sided jaw pain.  She states the last time she was pregnant.  She had similar symptoms and had to have a tooth pulled Tylenol without any relief.      Past Medical History:  Diagnosis Date  . Asthma   . Cardiomegaly     Patient Active Problem List   Diagnosis Date Noted  . S/P cesarean section 02/22/2015  . Not immune to rubella 02/07/2015  . Excess weight gain in pregnancy 01/03/2015  . Pregnancy-related glycosuria 01/03/2015  . Obesity complicating pregnancy in second trimester   . History of cardiomegaly 08/29/2014  . Supervision of normal pregnancy in first trimester 08/01/2014  . Migraine 05/12/2012    Past Surgical History:  Procedure Laterality Date  . CESAREAN SECTION N/A 02/19/2015   Procedure: CESAREAN SECTION;  Surgeon: Levie HeritageJacob J Stinson, DO;  Location: [facility];  Service: Obstetrics;  Laterality: N/A;  . DILATION AND EVACUATION  05/17/2012   Procedure: DILATATION AND EVACUATION;  Surgeon: Willodean Rosenthalarolyn Harraway-Smith, MD;  Location: [facility];  Service: Gynecology;  Laterality: N/A;  . DILATION AND EVACUATION  05/17/2012    OB History    Gravida Para Term Preterm AB Living   2 1 1  0 1 1   SAB TAB Ectopic Multiple Live Births   1 0 0 0 1       Home Medications    Prior to Admission medications   Medication Sig Start Date End Date Taking? Authorizing Provider  naproxen (NAPROSYN) 500 MG tablet Take 1 tablet (500 mg total) by mouth 2 (two) times daily with a meal. 08/18/16   [place], Anselm PancoastWilliam J, FNP  Prenatal Vit-Fe Fumarate-FA (PRENATAL COMPLETE) 14-0.4 MG TABS Take 1 tablet by mouth daily. 08/31/16   Barrett HenleNadeau, Nicole Elizabeth, PA-C    Family  History Family History  Problem Relation Age of Onset  . Diabetes Mother   . Hypertension Mother     Social History Social History  Substance Use Topics  . Smoking status: Never Smoker  . Smokeless tobacco: Never Used  . Alcohol use No     Allergies   Patient has no known allergies.   Review of Systems Review of Systems  Constitutional: Negative for fever.  HENT: Positive for dental problem and ear pain. Negative for facial swelling.   Respiratory: Negative for cough.   Gastrointestinal: Negative for abdominal pain.  Genitourinary: Negative for dysuria.  Neurological: Negative for headaches.  All other systems reviewed and are negative.    Physical Exam Updated Vital Signs BP 123/81 (BP Location: Right Arm)   Pulse 74   Temp 99.4 F (37.4 C) (Oral)   Resp 20   SpO2 100%   Physical Exam  Constitutional: She appears well-developed and well-nourished. No distress.  HENT:  Mouth/Throat: Uvula is midline, oropharynx is clear and moist and mucous membranes are normal. No dental abscesses or dental caries.  Bilateral cerumen impactions  I see no gum swelling or erythema  Cardiovascular: Normal rate.   Pulmonary/Chest: Effort normal.  Neurological: She is alert.  Skin: Skin is warm.  Psychiatric: She has a normal mood and affect.  Nursing note and  vitals reviewed.    ED Treatments / Results  Labs (all labs ordered are listed, but only abnormal results are displayed) Labs Reviewed - No data to display  EKG  EKG Interpretation None       Radiology No results found.  Procedures Procedures (including critical care time)  Medications Ordered in ED Medications  docusate (COLACE) 50 MG/5ML liquid 50 mg (50 mg Both Ears Given 10/14/16 0136)     Initial Impression / Assessment and Plan / ED Course  I have reviewed the triage vital signs and the nursing notes.  Pertinent labs & imaging results that were available during my care of the patient were  reviewed by me and considered in my medical decision making (see chart for details).      I see no sign of dental disease.  There is no gum swelling or erythema.  No dental caries noted.  She does have bilateral cerumen impactions.  This will be removed and patient reassessed Flow tabs at flushing the cerumen impaction unsuccessful, particularly on the right.  Patient has been referred to ENT in the morning.  She was instilled with additional Colace told to leave in place until morning and then try flushing herself with a shower. Final Clinical Impressions(s) / ED Diagnoses   Final diagnoses:  Bilateral impacted cerumen    New Prescriptions New Prescriptions   No medications on file     Earley Favor, NP 10/14/16 0121    Earley Favor, NP 10/14/16 0310    Zadie Rhine, MD 10/15/16 [phone]

## 2016-10-17 ENCOUNTER — Encounter (HOSPITAL_COMMUNITY): Payer: Self-pay | Admitting: *Deleted

## 2016-10-20 ENCOUNTER — Other Ambulatory Visit (HOSPITAL_COMMUNITY): Payer: Self-pay | Admitting: *Deleted

## 2016-10-20 ENCOUNTER — Encounter (HOSPITAL_COMMUNITY): Payer: Self-pay

## 2016-10-20 ENCOUNTER — Ambulatory Visit (HOSPITAL_COMMUNITY)
Admission: RE | Admit: 2016-10-20 | Discharge: 2016-10-20 | Disposition: A | Discharge status: Home / Self Care | Payer: Medicaid Other | Source: Ambulatory Visit | Attending: Nurse Practitioner | Admitting: Nurse Practitioner

## 2016-10-20 ENCOUNTER — Ambulatory Visit (HOSPITAL_COMMUNITY): Admission: RE | Admit: 2016-10-20 | Payer: Medicaid Other | Source: Ambulatory Visit

## 2016-10-20 DIAGNOSIS — O99212 Obesity complicating pregnancy, second trimester: Secondary | ICD-10-CM | POA: Diagnosis not present

## 2016-10-20 DIAGNOSIS — Z3481 Encounter for supervision of other normal pregnancy, first trimester: Secondary | ICD-10-CM

## 2016-10-20 DIAGNOSIS — O34219 Maternal care for unspecified type scar from previous cesarean delivery: Secondary | ICD-10-CM | POA: Insufficient documentation

## 2016-10-20 DIAGNOSIS — Z3682 Encounter for antenatal screening for nuchal translucency: Secondary | ICD-10-CM | POA: Insufficient documentation

## 2016-10-20 DIAGNOSIS — Z3A13 13 weeks gestation of pregnancy: Secondary | ICD-10-CM | POA: Diagnosis present

## 2016-10-20 DIAGNOSIS — Z3689 Encounter for other specified antenatal screening: Secondary | ICD-10-CM

## 2016-10-20 NOTE — Progress Notes (Signed)
Pt having tooth pain, had tooth extraction three days ago.  This RN instructed pt to call her oral surgeon.

## 2016-10-24 ENCOUNTER — Emergency Department (HOSPITAL_COMMUNITY)
Admission: EM | Admit: 2016-10-24 | Discharge: 2016-10-24 | Disposition: A | Discharge status: Home / Self Care | Payer: Medicaid Other | Attending: Emergency Medicine | Admitting: Emergency Medicine

## 2016-10-24 ENCOUNTER — Encounter (HOSPITAL_COMMUNITY): Payer: Self-pay | Admitting: Emergency Medicine

## 2016-10-24 DIAGNOSIS — J45909 Unspecified asthma, uncomplicated: Secondary | ICD-10-CM | POA: Diagnosis not present

## 2016-10-24 DIAGNOSIS — K0889 Other specified disorders of teeth and supporting structures: Secondary | ICD-10-CM | POA: Diagnosis present

## 2016-10-24 MED ORDER — AMOXICILLIN-POT CLAVULANATE 875-125 MG PO TABS
1.0000 | ORAL_TABLET | Freq: Once | ORAL | Status: AC
  Administered 2016-10-24: 1 via ORAL
  Filled 2016-10-24: qty 1

## 2016-10-24 MED ORDER — ACETAMINOPHEN 325 MG PO TABS
650.0000 mg | ORAL_TABLET | Freq: Once | ORAL | Status: AC
  Administered 2016-10-24: 650 mg via ORAL
  Filled 2016-10-24: qty 2

## 2016-10-24 MED ORDER — AMOXICILLIN-POT CLAVULANATE 875-125 MG PO TABS: 1.0000 | ORAL_TABLET | Freq: Two times a day (BID) | ORAL | 0 refills | Status: DC

## 2016-10-24 NOTE — ED Triage Notes (Signed)
Patient reports that she been having dental pain for two weeks. Had two teeth pulled in past week as well. Patient has swelling and jaw pain and [facility].

## 2016-10-24 NOTE — ED Provider Notes (Signed)
[facility]-EMERGENCY DEPT Provider Note   CSN: [id number] Arrival date & time: 10/24/16  [id number]     History   Chief Complaint Chief Complaint  Patient presents with  . Dental Pain  . Oral Swelling    HPI Nicole Brooks is a 31 y.o. female who presents to the Emergency Department with right-sided dental pain and facial swelling that began two weeks ago. She reports that had two teeth pulled last week, but now she has increase pain with touching the area where the teeth were removed. No fever,chilld, CP, Abdominal pain, N/V/DNKA. No chronic medical problems.   The history is provided by the patient. No language interpreter was used.  Dental Pain      Past Medical History:  Diagnosis Date  . Asthma   . Cardiomegaly     Patient Active Problem List   Diagnosis Date Noted  . S/P cesarean section 02/22/2015  . Not immune to rubella 02/07/2015  . Excess weight gain in pregnancy 01/03/2015  . Pregnancy-related glycosuria 01/03/2015  . Obesity complicating pregnancy in second trimester   . History of cardiomegaly 08/29/2014  . Supervision of normal pregnancy in first trimester 08/01/2014  . Migraine 05/12/2012    Past Surgical History:  Procedure Laterality Date  . CESAREAN SECTION N/A 02/19/2015   Procedure: CESAREAN SECTION;  Surgeon: Levie HeritageJacob J Stinson, DO;  Location: [facility];  Service: Obstetrics;  Laterality: N/A;  . DILATION AND EVACUATION  05/17/2012   Procedure: DILATATION AND EVACUATION;  Surgeon: Willodean Rosenthalarolyn Harraway-Smith, MD;  Location: [facility];  Service: Gynecology;  Laterality: N/A;  . DILATION AND EVACUATION  05/17/2012  . TOOTH EXTRACTION      OB History    Gravida Para Term Preterm AB Living   3 1 1  0 1 1   SAB TAB Ectopic Multiple Live Births   1 0 0 0 1       Home Medications    Prior to Admission medications   Medication Sig Start Date End Date Taking? Authorizing Provider  amoxicillin-clavulanate (AUGMENTIN) 875-125 MG tablet Take 1 tablet by mouth every 12  (twelve) hours. 10/24/16   Yannely Kintzel, Aricka A, PA-C  HYDROCODONE-ACETAMINOPHEN PO Take by mouth.    [provider]  naproxen (NAPROSYN) 500 MG tablet Take 1 tablet (500 mg total) by mouth 2 (two) times daily with a meal. Patient not taking: Reported on 10/20/2016 08/18/16   Deatra Canterxford, William J, FNP  PENICILLIN V POTASSIUM PO Take by mouth.    [provider]  Prenatal Vit-Fe Fumarate-FA (PRENATAL COMPLETE) 14-0.4 MG TABS Take 1 tablet by mouth daily. 08/31/16   Barrett HenleNadeau, Nicole Elizabeth, PA-C    Family History Family History  Problem Relation Age of Onset  . Diabetes Mother   . Hypertension Mother     Social History Social History  Substance Use Topics  . Smoking status: Never Smoker  . Smokeless tobacco: Never Used  . Alcohol use No     Allergies   Patient has no known allergies.   Review of Systems Review of Systems  Constitutional: Negative for activity change.  HENT: Positive for dental problem. Negative for drooling and voice change.   Respiratory: Negative for shortness of breath and stridor.   Cardiovascular: Negative for chest pain.  Gastrointestinal: Negative for abdominal pain, diarrhea, nausea and vomiting.  Musculoskeletal: Negative for back pain.  Skin: Negative for rash.  Allergic/Immunologic: Negative for immunocompromised state.  Neurological: Negative for facial asymmetry and headaches.     Physical Exam Updated Vital  Signs BP 125/77 (BP Location: Right Arm)   Pulse 85   Temp 98.1 F (36.7 C) (Oral)   Resp 16   LMP 07/17/2016 (Approximate)   SpO2 98%   Physical Exam  Constitutional: She is oriented to person, place, and time. She appears well-developed and well-nourished.  HENT:  Head: Normocephalic and atraumatic.  Mouth/Throat: She does not have dentures. No oral lesions. No trismus in the jaw. Dental caries present. No dental abscesses or uvula swelling.    Edentulous around teeth 44-46. There is some darkened material near the  right lower sided gumline. No obvious abscesses. No evidence of cellulitis.   Eyes: No scleral icterus.  Neck: Normal range of motion.  Cardiovascular: Normal rate.   Pulmonary/Chest: Effort normal. No respiratory distress.  Abdominal: She exhibits no distension.  Musculoskeletal: Normal range of motion.  Neurological: She is alert and oriented to person, place, and time.  Skin: Skin is warm and dry.  Psychiatric: Her behavior is normal.  Nursing note and vitals reviewed.    ED Treatments / Results  Labs (all labs ordered are listed, but only abnormal results are displayed) Labs Reviewed - No data to display  EKG  EKG Interpretation None       Radiology No results found.  Procedures Procedures (including critical care time)  Medications Ordered in ED Medications  acetaminophen (TYLENOL) tablet 650 mg (650 mg Oral Given 10/24/16 0911)  amoxicillin-clavulanate (AUGMENTIN) 875-125 MG per tablet 1 tablet (1 tablet Oral Given 10/24/16 0910)     Initial Impression / Assessment and Plan / ED Course  I have reviewed the triage vital signs and the nursing notes.  Pertinent labs & imaging results that were available during my care of the patient were reviewed by me and considered in my medical decision making (see chart for details).     Patient with toothache.  The patient was also seen and evaluated by Dr. Criss Alvine, attending physician. No gross abscess.  Exam unconcerning for Ludwig's angina or spread of infection.  Will treat with Augmentin since the patient is pregnant.  The patient is scheduled for a dentist appointment at 10 AM this morning. Encouraged the patient to keep this appointment. VSS. NAD. Will d/c the patient to home.  Final Clinical Impressions(s) / ED Diagnoses   Final diagnoses:  Pain, dental    New Prescriptions Discharge Medication List as of 10/24/2016  9:08 AM    START taking these medications   Details  amoxicillin-clavulanate (AUGMENTIN)  875-125 MG tablet Take 1 tablet by mouth every 12 (twelve) hours., Starting Fri 10/24/2016, Print         Harvin Konicek, Shabrea A, PA-C 10/24/16 2257    Pricilla Loveless, MD 10/25/16 [phone]

## 2016-10-24 NOTE — Discharge Instructions (Signed)
Please keep your appointment with your dentist at 10:00 AM this morning. You have been given one dose of Augmentin in the Emergency Department. If you develop drooling, the inability to swallow liquids, or difficulty breathing, please return to the Emergency Department for re-evaluation.

## 2016-10-24 NOTE — ED Notes (Signed)
Patient is alert and oriented x3.  She was given DC instructions and follow up visit instructions.  Patient gave verbal understanding. She was DC ambulatory under her own power to home.  V/S stable.  He was not showing any signs of distress on DC 

## 2016-10-26 ENCOUNTER — Encounter (HOSPITAL_COMMUNITY): Payer: Self-pay | Admitting: Emergency Medicine

## 2016-10-26 ENCOUNTER — Ambulatory Visit (HOSPITAL_COMMUNITY)
Admission: EM | Admit: 2016-10-26 | Discharge: 2016-10-26 | Disposition: A | Discharge status: Home / Self Care | Payer: Medicaid Other | Attending: Family Medicine | Admitting: Family Medicine

## 2016-10-26 DIAGNOSIS — K0889 Other specified disorders of teeth and supporting structures: Secondary | ICD-10-CM | POA: Diagnosis not present

## 2016-10-26 MED ORDER — ACETAMINOPHEN-CODEINE 300-30 MG PO TABS: 1.0000 | ORAL_TABLET | ORAL | 0 refills | Status: DC | PRN

## 2016-10-26 NOTE — Discharge Instructions (Signed)
You may try taking 2 of the Tylenol No. 3 tablets every 4-6 hours as needed for pain. Take few as possible since you are pregnant. Call your dentist on Tuesday. Continue your amoxicillin.

## 2016-10-26 NOTE — ED Triage Notes (Signed)
Pt here for dental pain onset 1 week associated w/facial swelling on right side.... sts she had 2 teeth pulled   Sts it hurts to talk and her tongue also hurts  Denies fevers, chills  [facility] 5/25 for similar sx  Pt is currently [redacted] weeks pregnant   A&O x4... NAD... Ambulatory

## 2016-10-26 NOTE — ED Provider Notes (Signed)
CSN: [id number]     Arrival date & time 10/26/16  1223 History   First MD Initiated Contact with Patient 10/26/16 1413     Chief Complaint  Patient presents with  . Dental Pain   (Consider location/radiation/quality/duration/timing/severity/associated sxs/prior Treatment) 31 year old female [redacted] weeks gestation is complaining of right tooth pain. In the past few days, less than a week she has been seen at the emergency department, ENT, her dentist. She had 2 right lower molars pulled. And she is complaining of increased pain. She was given Tylenol 3 for pain and taking 1 every 4 hours for a total of 3 a day is not helping with her pain. She has an appointment with her dentist and 5 days. She has appointment with her OB  in 3 days. She states there is some much pain on the right side of her jaw she is unable to eat and it is hard to drink even drinking milk causes pain to the right side of the lower mandible/teeth. She also complains of pain to the right portion of the tongue. This limits the movement of her time she is unable to protrude the tongue out of the mouth.      Past Medical History:  Diagnosis Date  . Asthma   . Cardiomegaly    Past Surgical History:  Procedure Laterality Date  . CESAREAN SECTION N/A 02/19/2015   Procedure: CESAREAN SECTION;  Surgeon: Levie Heritage, DO;  Location: [facility];  Service: Obstetrics;  Laterality: N/A;  . DILATION AND EVACUATION  05/17/2012   Procedure: DILATATION AND EVACUATION;  Surgeon: Willodean Rosenthal, MD;  Location: [facility];  Service: Gynecology;  Laterality: N/A;  . DILATION AND EVACUATION  05/17/2012  . TOOTH EXTRACTION     Family History  Problem Relation Age of Onset  . Diabetes Mother   . Hypertension Mother    Social History  Substance Use Topics  . Smoking status: Never Smoker  . Smokeless tobacco: Never Used  . Alcohol use No   OB History    Gravida Para Term Preterm AB Living   3 1 1  0 1 1   SAB TAB Ectopic Multiple Live  Births   1 0 0 0 1     Review of Systems  Constitutional: Negative for activity change, appetite change, fatigue and fever.  HENT: Positive for dental problem. Negative for postnasal drip and sore throat.        As per history of present illness  Respiratory: Negative.   Cardiovascular: Negative.   Gastrointestinal: Negative.   Neurological: Negative.     Allergies  Patient has no known allergies.  Home Medications   Prior to Admission medications   Medication Sig Start Date End Date Taking? Authorizing Provider  amoxicillin-clavulanate (AUGMENTIN) 875-125 MG tablet Take 1 tablet by mouth every 12 (twelve) hours. 10/24/16  Yes McDonald, Kynlea A, PA-C  HYDROCODONE-ACETAMINOPHEN PO Take by mouth.    [provider]  Prenatal Vit-Fe Fumarate-FA (PRENATAL COMPLETE) 14-0.4 MG TABS Take 1 tablet by mouth daily. 08/31/16   Barrett Henle, PA-C   Meds Ordered and Administered this Visit  Medications - No data to display  BP 124/68 (BP Location: Left Arm)   Pulse 77   Temp 97.9 F (36.6 C) (Oral)   Resp 18   LMP 07/17/2016 (Approximate)   SpO2 100%  No data found.   Physical Exam  Constitutional: She is oriented to person, place, and time. She appears well-developed and well-nourished.  HENT:  Head: Normocephalic.  Mouth/Throat: Oropharynx is clear and moist.  The right lower first and second molars are missing. The surgical area is healing well. It is pink. There is no swelling or redness. No swelling seen around the gums or gingiva or other areas. No changes observed to the tongue. There is some white-ish coating over the tongue. Airways patent and she is able to breathe well.  Neck: Normal range of motion. Neck supple.  Cardiovascular: Normal rate.   Pulmonary/Chest: Effort normal.  Lymphadenopathy:    She has no cervical adenopathy.  Neurological: She is alert and oriented to person, place, and time.  Skin: Skin is warm and dry.  Nursing note and vitals  reviewed.   Urgent Care Course     Procedures (including critical care time)  Labs Review Labs Reviewed - No data to display  Imaging Review No results found.   Visual Acuity Review  Right Eye Distance:   Left Eye Distance:   Bilateral Distance:    Right Eye Near:   Left Eye Near:    Bilateral Near:         MDM   1. Pain, dental    You may try taking 2 of the Tylenol No. 3 tablets every 4-6 hours as needed for pain. Take few as possible since you are pregnant. Call your dentist on Tuesday. Continue your amoxicillin. Discussed potential problems with medicines while pregnancy and how can affect the pregnancy. Patient states she understands.    Meds ordered this encounter  Medications  . Acetaminophen-Codeine 300-30 MG tablet    Sig: Take 1 tablet by mouth every 4 (four) hours as needed for pain.    Dispense:  15 tablet    Refill:  0    Order Specific Question:   Supervising Provider    Answer:   Lonia BloodLAUENSTEIN, KURT [[id number]]    Hayden RasmussenMabe, Misael Mcgaha, NP 10/26/16 1451    Hayden RasmussenMabe, Breeanna Galgano, NP 10/26/16 1455

## 2016-12-01 ENCOUNTER — Encounter (HOSPITAL_COMMUNITY): Payer: Self-pay

## 2016-12-01 ENCOUNTER — Other Ambulatory Visit (HOSPITAL_COMMUNITY): Payer: Self-pay | Admitting: Obstetrics and Gynecology

## 2016-12-01 ENCOUNTER — Ambulatory Visit (HOSPITAL_COMMUNITY)
Admission: RE | Admit: 2016-12-01 | Discharge: 2016-12-01 | Disposition: A | Discharge status: Home / Self Care | Payer: Medicaid Other | Source: Ambulatory Visit | Attending: Nurse Practitioner | Admitting: Nurse Practitioner

## 2016-12-01 DIAGNOSIS — O99212 Obesity complicating pregnancy, second trimester: Secondary | ICD-10-CM

## 2016-12-01 DIAGNOSIS — Z3689 Encounter for other specified antenatal screening: Secondary | ICD-10-CM | POA: Insufficient documentation

## 2016-12-01 DIAGNOSIS — O34219 Maternal care for unspecified type scar from previous cesarean delivery: Secondary | ICD-10-CM

## 2016-12-01 DIAGNOSIS — Z3A19 19 weeks gestation of pregnancy: Secondary | ICD-10-CM

## 2016-12-08 ENCOUNTER — Ambulatory Visit (INDEPENDENT_AMBULATORY_CARE_PROVIDER_SITE_OTHER): Payer: Medicaid Other | Admitting: Diagnostic Neuroimaging

## 2016-12-08 ENCOUNTER — Encounter: Payer: Self-pay | Admitting: Diagnostic Neuroimaging

## 2016-12-08 VITALS — BP 118/75 | HR 76 | Ht 60.0 in | Wt 178.8 lb

## 2016-12-08 DIAGNOSIS — Z3A21 21 weeks gestation of pregnancy: Secondary | ICD-10-CM

## 2016-12-08 DIAGNOSIS — G500 Trigeminal neuralgia: Secondary | ICD-10-CM | POA: Diagnosis not present

## 2016-12-08 NOTE — Progress Notes (Signed)
[facility]  PATIENT: Nicole Brooks DOB: 1986/03/07  REFERRING CLINICIAN: S Rehm HISTORY FROM: patient  REASON FOR VISIT: new consult    HISTORICAL  CHIEF COMPLAINT:  Chief Complaint  Patient presents with  . NP Rehm  . Trigeminal Neuralgia  Right    Pregnant 5 months.    HISTORY OF PRESENT ILLNESS:   31 year old female with right facial pain. Patient is currently [redacted] weeks pregnant.  In April 2018 patient had onset of pain in her right lower jaw region. Her dentist found tooth #30 was tender to percussion. Therefore she had extraction of tooth #30 on 10/17/16. However symptoms continued. Patient had tooth #29 removed after 1 week. However symptoms continued to persist. Patient feels pain in her right ear, right jaw, tenderness to touch in her right face. Cold drinks and hard foods aggravate the pain. Patient had similar problems in 2016 during her last pregnancy. Symptoms resolved spontaneously at that time.  No problems with left face. No problems with arms or legs. No other specific triggering or aggravating factors.  Patient getting her OB/GYN care through [facility].  Patient works in Public affairs consultant at [employer].    REVIEW OF SYSTEMS: Full 14 system review of systems performed and negative with exception of: Allergies.  ALLERGIES: No Known Allergies  HOME MEDICATIONS: Outpatient Medications Prior to Visit  Medication Sig Dispense Refill  . acetaminophen (TYLENOL) 325 MG tablet Take 650 mg by mouth every 6 (six) hours as needed.    . Prenatal Vit-Fe Fumarate-FA (PRENATAL COMPLETE) 14-0.4 MG TABS Take 1 tablet by mouth daily. 60 each 0  . Acetaminophen-Codeine 300-30 MG tablet Take 1 tablet by mouth every 4 (four) hours as needed for pain. (Patient not taking: Reported on 12/08/2016) 15 tablet 0  . amoxicillin-clavulanate (AUGMENTIN) 875-125 MG tablet Take 1 tablet by mouth every 12 (twelve) hours. (Patient  not taking: Reported on 12/08/2016) 14 tablet 0  . HYDROCODONE-ACETAMINOPHEN PO Take by mouth.     No facility-administered medications prior to visit.     PAST MEDICAL HISTORY: Past Medical History:  Diagnosis Date  . Asthma   . Cardiomegaly     PAST SURGICAL HISTORY: Past Surgical History:  Procedure Laterality Date  . CESAREAN SECTION N/A 02/19/2015   Procedure: CESAREAN SECTION;  Surgeon: Levie Heritage, DO;  Location: [facility];  Service: Obstetrics;  Laterality: N/A;  . DILATION AND EVACUATION  05/17/2012   Procedure: DILATATION AND EVACUATION;  Surgeon: Willodean Rosenthal, MD;  Location: [facility];  Service: Gynecology;  Laterality: N/A;  . DILATION AND EVACUATION  05/17/2012  . TOOTH EXTRACTION      FAMILY HISTORY: Family History  Problem Relation Age of Onset  . Diabetes Mother   . Hypertension Mother     SOCIAL HISTORY:  Social History   Social History  . Marital status: Single    Spouse name: N/A  . Number of children: N/A  . Years of education: N/A   Occupational History  . Not on file.   Social History Main Topics  . Smoking status: Never Smoker  . Smokeless tobacco: Never Used  . Alcohol use No  . Drug use: No  . Sexual activity: Yes    Birth control/ protection: None   Other Topics Concern  . Not on file   Social History Narrative   Lives home with SO and 2 yr old son.  Works at [employer].  Drinks coffee  sometimes.     PHYSICAL EXAM  GENERAL EXAM/CONSTITUTIONAL: Vitals:  Vitals:   12/08/16 1018  BP: 118/75  Pulse: 76  Weight: 178 lb 12.8 oz (81.1 kg)  Height: 5' (1.524 m)     Body mass index is 34.92 kg/m.  Visual Acuity Screening   Right eye Left eye Both eyes  Without correction: 20/40 20/50   With correction:        Patient is in no distress; well developed, nourished and groomed; neck is supple  CARDIOVASCULAR:  Examination of carotid arteries is normal; no carotid bruits  Regular rate and rhythm,  no murmurs  Examination of peripheral vascular system by observation and palpation is normal  EYES:  Ophthalmoscopic exam of optic discs and posterior segments is normal; no papilledema or hemorrhages  MUSCULOSKELETAL:  Gait, strength, tone, movements noted in Neurologic exam below  NEUROLOGIC: MENTAL STATUS:  No flowsheet data found.  awake, alert, oriented to person, place and time  recent and remote memory intact  normal attention and concentration  language fluent, comprehension intact, naming intact,   fund of knowledge appropriate  CRANIAL NERVE:   2nd - no papilledema on fundoscopic exam  2nd, 3rd, 4th, 6th - pupils equal and reactive to light, visual fields full to confrontation, extraocular muscles intact, no nystagmus  5th - facial sensation symmetric  7th - facial strength symmetric  8th - hearing intact  9th - palate elevates symmetrically, uvula midline  11th - shoulder shrug symmetric  12th - tongue protrusion midline  MOTOR:   normal bulk and tone, full strength in the BUE, BLE  SENSORY:   normal and symmetric to light touch, temperature, vibration  COORDINATION:   finger-nose-finger, fine finger movements normal  REFLEXES:   deep tendon reflexes present and symmetric  GAIT/STATION:   narrow based gait; able to walk tandem; romberg is negative    DIAGNOSTIC DATA (LABS, IMAGING, TESTING) - I reviewed patient records, labs, notes, testing and imaging myself where available.  Lab Results  Component Value Date   WBC 7.8 08/31/2016   HGB 10.8 (L) 08/31/2016   HCT 33.6 (L) 08/31/2016   MCV 86.6 08/31/2016   PLT 284 08/31/2016      Component Value Date/Time   NA 138 08/31/2016 1402   K 3.0 (L) 08/31/2016 1402   CL 107 08/31/2016 1402   CO2 24 08/31/2016 1402   GLUCOSE 97 08/31/2016 1402   BUN 14 08/31/2016 1402   CREATININE 0.62 08/31/2016 1402   CALCIUM 9.2 08/31/2016 1402   GFRNONAA >60 08/31/2016 1402   GFRAA >60  08/31/2016 1402   No results found for: CHOL, HDL, LDLCALC, LDLDIRECT, TRIG, CHOLHDL No results found for: ZOXW9U No results found for: VITAMINB12 No results found for: TSH      ASSESSMENT AND PLAN  31 y.o. year old female here with Right facial pain in V2 and V3 distribution of trigeminal nerve, with recent dental extractions #29 a #30 teeth, without improvement in symptoms. 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 However patient is currently pregnant [redacted] weeks estimated gestational age, which limits workup and treatment options. I discussed this with patient and will pursue conservative management until completion of pregnancy. At that time we may consider additional testing and treatment options.   Dx:  1. Right trigeminal neuralgia   2. [redacted] weeks gestation of pregnancy      PLAN:  RIGHT TRIGEMINAL NEURALGIA + PREGNANCY ([redacted] weeks EGA) (new problem, additional workup considered) - consider MRI brain and CN5  protocol; will hold off until after delivery (~04/23/17) - continue tylenol as needed for pain; after delivery, may consider carbamazepine or gabapentin for trigeminal nerve pain  Return in about 3 months (around 03/10/2017).    Suanne MarkerVIKRAM R. Huxton Glaus, MD 12/08/2016, 10:39 AM Certified in Neurology, Neurophysiology and Neuroimaging  [facility] [address], [address] [place], [place] [id number] [phone]

## 2016-12-08 NOTE — Patient Instructions (Signed)

## 2017-01-16 LAB — OB RESULTS CONSOLE GBS: STREP GROUP B AG: NEGATIVE

## 2017-03-17 ENCOUNTER — Encounter (HOSPITAL_COMMUNITY): Payer: Self-pay

## 2017-03-23 ENCOUNTER — Ambulatory Visit: Payer: Medicaid Other | Admitting: Diagnostic Neuroimaging

## 2017-03-27 ENCOUNTER — Ambulatory Visit (INDEPENDENT_AMBULATORY_CARE_PROVIDER_SITE_OTHER): Payer: Medicaid Other | Admitting: Diagnostic Neuroimaging

## 2017-03-27 ENCOUNTER — Encounter: Payer: Self-pay | Admitting: Diagnostic Neuroimaging

## 2017-03-27 VITALS — BP 123/81 | HR 77 | Wt 181.2 lb

## 2017-03-27 DIAGNOSIS — Z3A36 36 weeks gestation of pregnancy: Secondary | ICD-10-CM

## 2017-03-27 DIAGNOSIS — G500 Trigeminal neuralgia: Secondary | ICD-10-CM

## 2017-03-27 NOTE — Progress Notes (Signed)
[facility]  PATIENT: Nicole Brooks DOB: 1985-10-11  REFERRING CLINICIAN: S Rehm HISTORY FROM: patient  REASON FOR VISIT: follow up   HISTORICAL  CHIEF COMPLAINT:  Chief Complaint  Patient presents with  . Right trigeminal neuralgia    rm 6, [redacted] weeks pregnant, "facial pain went away for a month, has come back in past week, unsure if related to cold weather"  . Follow-up    3 month    HISTORY OF PRESENT ILLNESS:   UPDATE (03/27/17, VRP): Since last visit, was doing better, but now pain returned. Taking tylenol without relief. C-section scheduled for 04/17/17 (now [redacted] weeks EGA). Cold and hard food still are aggravating factors.   PRIOR HPI (12/08/16, VRP): 31 year old female with right facial pain. Patient is currently [redacted] weeks pregnant.  In April 2018 patient had onset of pain in her right lower jaw region. Her dentist found tooth #30 was tender to percussion. Therefore she had extraction of tooth #30 on 10/17/16. However symptoms continued. Patient had tooth #29 removed after 1 week. However symptoms continued to persist. Patient feels pain in her right ear, right jaw, tenderness to touch in her right face. Cold drinks and hard foods aggravate the pain. Patient had similar problems in 2016 during her last pregnancy. Symptoms resolved spontaneously at that time.  No problems with left face. No problems with arms or legs. No other specific triggering or aggravating factors.  Patient getting her OB/GYN care through [facility].  Patient works in Public affairs consultant at [employer].    REVIEW OF SYSTEMS: Full 14 system review of systems performed and negative with exception of: headache cough back pain ear pain. Marland Kitchen  ALLERGIES: No Known Allergies  HOME MEDICATIONS: Outpatient Medications Prior to Visit  Medication Sig Dispense Refill  . acetaminophen (TYLENOL) 325 MG tablet Take 650 mg by mouth every 6 (six) hours as needed.     . Prenatal Vit-Fe Fumarate-FA (PRENATAL COMPLETE) 14-0.4 MG TABS Take 1 tablet by mouth daily. 60 each 0   No facility-administered medications prior to visit.     PAST MEDICAL HISTORY: Past Medical History:  Diagnosis Date  . Asthma   . Cardiomegaly   . Trigeminal neuralgia    right    PAST SURGICAL HISTORY: Past Surgical History:  Procedure Laterality Date  . CESAREAN SECTION N/A 02/19/2015   Procedure: CESAREAN SECTION;  Surgeon: Levie Heritage, DO;  Location: [facility];  Service: Obstetrics;  Laterality: N/A;  . DILATION AND EVACUATION  05/17/2012   Procedure: DILATATION AND EVACUATION;  Surgeon: Willodean Rosenthal, MD;  Location: [facility];  Service: Gynecology;  Laterality: N/A;  . DILATION AND EVACUATION  05/17/2012  . TOOTH EXTRACTION      FAMILY HISTORY: Family History  Problem Relation Age of Onset  . Diabetes Mother   . Hypertension Mother     SOCIAL HISTORY:  Social History   Social History  . Marital status: Single    Spouse name: N/A  . Number of children: 1  . Years of education: N/A   Occupational History  .      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 Smoking status: Never Smoker  . Smokeless tobacco: Never Used  . Alcohol use No  . Drug use: No  . Sexual activity: Yes    Birth control/ protection: None   Other Topics Concern  . Not on file   Social History Narrative  Lives home with SO and 2 yr old son.  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  Drinks coffee sometimes.     PHYSICAL EXAM  GENERAL EXAM/CONSTITUTIONAL: Vitals:  Vitals:   03/27/17 0916  BP: 123/81  Pulse: 77  Weight: 181 lb 3.2 oz (82.2 kg)   Body mass index is 35.39 kg/m. No exam data present  Patient is in no distress; well developed, nourished and groomed; neck is supple  CARDIOVASCULAR:  Examination of carotid arteries is normal; no carotid bruits  Regular rate and rhythm, no murmurs  Examination of peripheral  vascular system by observation and palpation is normal  EYES:  Ophthalmoscopic exam of optic discs and posterior segments is normal; no papilledema or hemorrhages  MUSCULOSKELETAL:  Gait, strength, tone, movements noted in Neurologic exam below  NEUROLOGIC: MENTAL STATUS:  No flowsheet data found.  awake, alert, oriented to person, place and time  recent and remote memory intact  normal attention and concentration  language fluent, comprehension intact, naming intact,   fund of knowledge appropriate  CRANIAL NERVE:   2nd - no papilledema on fundoscopic exam  2nd, 3rd, 4th, 6th - pupils equal and reactive to light, visual fields full to confrontation, extraocular muscles intact, no nystagmus  5th - facial sensation symmetric  7th - facial strength symmetric  8th - hearing intact  9th - palate elevates symmetrically, uvula midline  11th - shoulder shrug symmetric  12th - tongue protrusion midline  MOTOR:   normal bulk and tone, full strength in the BUE, BLE  SENSORY:   normal and symmetric to light touch  COORDINATION:   finger-nose-finger, fine finger movements normal  REFLEXES:   deep tendon reflexes present and symmetric  GAIT/STATION:   narrow based gait    DIAGNOSTIC DATA (LABS, IMAGING, TESTING) - I reviewed patient records, labs, notes, testing and imaging myself where available.  Lab Results  Component Value Date   WBC 7.8 08/31/2016   HGB 10.8 (L) 08/31/2016   HCT 33.6 (L) 08/31/2016   MCV 86.6 08/31/2016   PLT 284 08/31/2016      Component Value Date/Time   NA 138 08/31/2016 1402   K 3.0 (L) 08/31/2016 1402   CL 107 08/31/2016 1402   CO2 24 08/31/2016 1402   GLUCOSE 97 08/31/2016 1402   BUN 14 08/31/2016 1402   CREATININE 0.62 08/31/2016 1402   CALCIUM 9.2 08/31/2016 1402   GFRNONAA >60 08/31/2016 1402   GFRAA >60 08/31/2016 1402   No results found for: CHOL, HDL, LDLCALC, LDLDIRECT, TRIG, CHOLHDL No results found for:  HGBA1C No results found for: VITAMINB12 No results found for: TSH      ASSESSMENT AND PLAN  31 y.o. year old female here with Right facial pain in V2 and V3 distribution of trigeminal nerve, with recent dental extractions #29 and  #30 teeth, without improvement in symptoms. 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 However patient is currently pregnant [redacted] weeks estimated gestational age, which limits workup and treatment options. I discussed this with patient and will pursue conservative management until completion of pregnancy. At that time we may consider additional testing and treatment options.   Dx:  1. Right trigeminal neuralgia   2. [redacted] weeks gestation of pregnancy      PLAN:  I spent 15 minutes of face to face time with patient. Greater than 50% of time was spent in counseling and coordination of care with patient. In summary we discussed:   RIGHT TRIGEMINAL  NEURALGIA + PREGNANCY ([redacted] weeks EGA) - consider MRI brain and CN5 protocol; will hold off until after delivery (~04/17/17) - continue tylenol as needed for pain; after delivery, may consider carbamazepine or gabapentin for trigeminal nerve pain  Return in about 3 months (around 06/27/2017).    Suanne Marker, MD 03/27/2017, 9:44 AM Certified in Neurology, Neurophysiology and Neuroimaging  [facility] [address] [place], [place] [address] [phone]

## 2017-04-03 ENCOUNTER — Inpatient Hospital Stay (HOSPITAL_COMMUNITY)
Admission: AD | Admit: 2017-04-03 | Discharge: 2017-04-03 | Disposition: A | Discharge status: Home / Self Care | Payer: Medicaid Other | Source: Ambulatory Visit | Attending: Obstetrics & Gynecology | Admitting: Obstetrics & Gynecology

## 2017-04-03 ENCOUNTER — Encounter (HOSPITAL_COMMUNITY): Payer: Self-pay | Admitting: *Deleted

## 2017-04-03 DIAGNOSIS — O479 False labor, unspecified: Secondary | ICD-10-CM

## 2017-04-03 NOTE — MAU Note (Signed)
I have communicated with Dr. Verta EllenMinott and reviewed vital signs:  Vitals:   04/03/17 1545  BP: 132/82  Pulse: 79  Resp: 18  Temp: 98.9 F (37.2 C)    Vaginal exam:  Dilation: 2 Effacement (%): 70 Cervical Position: Middle Station: -2 Presentation: Vertex Exam by:: K.Rayven Rettig,Rn,   Also reviewed contraction pattern and that non-stress test is reactive.  It has been documented that patient is contracting every 7-9 minutes with no cervical change over 1.5 hours not indicating active labor.  Patient denies any other complaints.  Based on this report provider has given order for discharge.  A discharge order and diagnosis entered by a provider.   Labor discharge instructions reviewed with patient.

## 2017-04-03 NOTE — MAU Note (Signed)
Pt C/O uc's since yesterday, became more painful this morning.  Has been noting blood with wiping, denies LOF.  Reports good FM.

## 2017-04-04 ENCOUNTER — Inpatient Hospital Stay (HOSPITAL_COMMUNITY): Payer: Medicaid Other | Admitting: Anesthesiology

## 2017-04-04 ENCOUNTER — Encounter (HOSPITAL_COMMUNITY): Payer: Self-pay | Admitting: Emergency Medicine

## 2017-04-04 ENCOUNTER — Inpatient Hospital Stay (HOSPITAL_COMMUNITY)
Admission: AD | Admit: 2017-04-04 | Discharge: 2017-04-06 | DRG: 798 | Disposition: A | Discharge status: Home / Self Care | Payer: Medicaid Other | Source: Ambulatory Visit | Attending: Obstetrics and Gynecology | Admitting: Obstetrics and Gynecology

## 2017-04-04 ENCOUNTER — Encounter (HOSPITAL_COMMUNITY)
Admission: AD | Disposition: A | Discharge status: Home / Self Care | Payer: Self-pay | Source: Ambulatory Visit | Attending: Obstetrics and Gynecology

## 2017-04-04 DIAGNOSIS — O99824 Streptococcus B carrier state complicating childbirth: Secondary | ICD-10-CM | POA: Diagnosis present

## 2017-04-04 DIAGNOSIS — E669 Obesity, unspecified: Secondary | ICD-10-CM | POA: Diagnosis present

## 2017-04-04 DIAGNOSIS — O99214 Obesity complicating childbirth: Secondary | ICD-10-CM | POA: Diagnosis present

## 2017-04-04 DIAGNOSIS — O34211 Maternal care for low transverse scar from previous cesarean delivery: Secondary | ICD-10-CM | POA: Diagnosis present

## 2017-04-04 DIAGNOSIS — Z3A37 37 weeks gestation of pregnancy: Secondary | ICD-10-CM | POA: Diagnosis not present

## 2017-04-04 DIAGNOSIS — O9902 Anemia complicating childbirth: Secondary | ICD-10-CM | POA: Diagnosis present

## 2017-04-04 DIAGNOSIS — D649 Anemia, unspecified: Secondary | ICD-10-CM | POA: Diagnosis present

## 2017-04-04 DIAGNOSIS — Z302 Encounter for sterilization: Secondary | ICD-10-CM

## 2017-04-04 LAB — TYPE AND SCREEN
ABO/RH(D): A POS
Antibody Screen: NEGATIVE

## 2017-04-04 LAB — CBC
HEMATOCRIT: 36.5 % (ref 36.0–46.0)
HEMOGLOBIN: 12 g/dL (ref 12.0–15.0)
MCH: 28.2 pg (ref 26.0–34.0)
MCHC: 32.9 g/dL (ref 30.0–36.0)
MCV: 85.7 fL (ref 78.0–100.0)
Platelets: 264 10*3/uL (ref 150–400)
RBC: 4.26 MIL/uL (ref 3.87–5.11)
RDW: 13.2 % (ref 11.5–15.5)
WBC: 8.5 10*3/uL (ref 4.0–10.5)

## 2017-04-04 LAB — OB RESULTS CONSOLE GBS: STREP GROUP B AG: POSITIVE

## 2017-04-04 SURGERY — Surgical Case
Anesthesia: Regional | Laterality: Bilateral

## 2017-04-04 MED ORDER — LACTATED RINGERS IV SOLN
INTRAVENOUS | Status: DC
  Administered 2017-04-05: 08:00:00 via INTRAVENOUS

## 2017-04-04 MED ORDER — OXYCODONE-ACETAMINOPHEN 5-325 MG PO TABS: 1.0000 | ORAL_TABLET | ORAL | Status: DC | PRN

## 2017-04-04 MED ORDER — DIPHENHYDRAMINE HCL 25 MG PO CAPS: 25.0000 mg | ORAL_CAPSULE | Freq: Four times a day (QID) | ORAL | Status: DC | PRN

## 2017-04-04 MED ORDER — ONDANSETRON HCL 4 MG/2ML IJ SOLN
4.0000 mg | Freq: Four times a day (QID) | INTRAMUSCULAR | Status: DC | PRN
  Administered 2017-04-04: 4 mg via INTRAVENOUS
  Filled 2017-04-04: qty 2

## 2017-04-04 MED ORDER — PHENYLEPHRINE 40 MCG/ML (10ML) SYRINGE FOR IV PUSH (FOR BLOOD PRESSURE SUPPORT)
80.0000 ug | PREFILLED_SYRINGE | INTRAVENOUS | Status: DC | PRN
  Filled 2017-04-04: qty 5

## 2017-04-04 MED ORDER — KETOROLAC TROMETHAMINE 30 MG/ML IJ SOLN
INTRAMUSCULAR | Status: AC
  Filled 2017-04-04: qty 2

## 2017-04-04 MED ORDER — DEXAMETHASONE SODIUM PHOSPHATE 10 MG/ML IJ SOLN
INTRAMUSCULAR | Status: AC
  Filled 2017-04-04: qty 1

## 2017-04-04 MED ORDER — FAMOTIDINE IN NACL 20-0.9 MG/50ML-% IV SOLN
INTRAVENOUS | Status: AC
  Filled 2017-04-04: qty 50

## 2017-04-04 MED ORDER — PRENATAL MULTIVITAMIN CH
1.0000 | ORAL_TABLET | Freq: Every day | ORAL | Status: DC
  Administered 2017-04-06: 1 via ORAL
  Filled 2017-04-04: qty 1

## 2017-04-04 MED ORDER — PHENYLEPHRINE 8 MG IN D5W 100 ML (0.08MG/ML) PREMIX OPTIME
INJECTION | INTRAVENOUS | Status: AC
  Filled 2017-04-04: qty 100

## 2017-04-04 MED ORDER — TETANUS-DIPHTH-ACELL PERTUSSIS 5-2.5-18.5 LF-MCG/0.5 IM SUSP: 0.5000 mL | Freq: Once | INTRAMUSCULAR | Status: DC

## 2017-04-04 MED ORDER — OXYTOCIN 10 UNIT/ML IJ SOLN
INTRAMUSCULAR | Status: AC
  Filled 2017-04-04: qty 4

## 2017-04-04 MED ORDER — ACETAMINOPHEN 325 MG PO TABS: 650.0000 mg | ORAL_TABLET | ORAL | Status: DC | PRN

## 2017-04-04 MED ORDER — OXYTOCIN BOLUS FROM INFUSION
500.0000 mL | Freq: Once | INTRAVENOUS | Status: AC
  Administered 2017-04-04: 500 mL via INTRAVENOUS

## 2017-04-04 MED ORDER — EPHEDRINE 5 MG/ML INJ
10.0000 mg | INTRAVENOUS | Status: DC | PRN
  Filled 2017-04-04: qty 2

## 2017-04-04 MED ORDER — SCOPOLAMINE 1 MG/3DAYS TD PT72
MEDICATED_PATCH | TRANSDERMAL | Status: AC
  Filled 2017-04-04: qty 1

## 2017-04-04 MED ORDER — FENTANYL CITRATE (PF) 100 MCG/2ML IJ SOLN: 50.0000 ug | INTRAMUSCULAR | Status: DC | PRN

## 2017-04-04 MED ORDER — PENICILLIN G POT IN DEXTROSE 60000 UNIT/ML IV SOLN
3.0000 10*6.[IU] | INTRAVENOUS | Status: DC
  Administered 2017-04-04: 3 10*6.[IU] via INTRAVENOUS
  Filled 2017-04-04 (×5): qty 50

## 2017-04-04 MED ORDER — DIPHENHYDRAMINE HCL 50 MG/ML IJ SOLN: 12.5000 mg | INTRAMUSCULAR | Status: DC | PRN

## 2017-04-04 MED ORDER — SOD CITRATE-CITRIC ACID 500-334 MG/5ML PO SOLN
ORAL | Status: AC
  Filled 2017-04-04: qty 15

## 2017-04-04 MED ORDER — FAMOTIDINE IN NACL 20-0.9 MG/50ML-% IV SOLN: 20.0000 mg | Freq: Once | INTRAVENOUS | Status: DC

## 2017-04-04 MED ORDER — PHENYLEPHRINE 40 MCG/ML (10ML) SYRINGE FOR IV PUSH (FOR BLOOD PRESSURE SUPPORT)
80.0000 ug | PREFILLED_SYRINGE | INTRAVENOUS | Status: DC | PRN
  Filled 2017-04-04: qty 5
  Filled 2017-04-04: qty 10

## 2017-04-04 MED ORDER — LIDOCAINE HCL (PF) 1 % IJ SOLN
30.0000 mL | INTRAMUSCULAR | Status: DC | PRN
  Administered 2017-04-04: 30 mL via SUBCUTANEOUS
  Filled 2017-04-04: qty 30

## 2017-04-04 MED ORDER — ACETAMINOPHEN 325 MG PO TABS
650.0000 mg | ORAL_TABLET | ORAL | Status: DC | PRN
Start: 2017-04-04 — End: 2017-04-06
  Administered 2017-04-06: 650 mg via ORAL
  Filled 2017-04-04: qty 2

## 2017-04-04 MED ORDER — ZOLPIDEM TARTRATE 5 MG PO TABS: 5.0000 mg | ORAL_TABLET | Freq: Every evening | ORAL | Status: DC | PRN

## 2017-04-04 MED ORDER — LACTATED RINGERS IV SOLN
INTRAVENOUS | Status: DC
  Administered 2017-04-04: 09:00:00 via INTRAVENOUS

## 2017-04-04 MED ORDER — IBUPROFEN 600 MG PO TABS
600.0000 mg | ORAL_TABLET | Freq: Four times a day (QID) | ORAL | Status: DC
  Administered 2017-04-05 – 2017-04-06 (×5): 600 mg via ORAL
  Filled 2017-04-04 (×6): qty 1

## 2017-04-04 MED ORDER — DEXTROSE 5 % IV SOLN
5.0000 10*6.[IU] | Freq: Once | INTRAVENOUS | Status: AC
  Administered 2017-04-04: 5 10*6.[IU] via INTRAVENOUS
  Filled 2017-04-04: qty 5

## 2017-04-04 MED ORDER — METOCLOPRAMIDE HCL 10 MG PO TABS
10.0000 mg | ORAL_TABLET | Freq: Once | ORAL | Status: AC
  Administered 2017-04-05: 10 mg via ORAL
  Filled 2017-04-04: qty 1

## 2017-04-04 MED ORDER — MORPHINE SULFATE (PF) 0.5 MG/ML IJ SOLN
INTRAMUSCULAR | Status: AC
  Filled 2017-04-04: qty 10

## 2017-04-04 MED ORDER — BUPIVACAINE IN DEXTROSE 0.75-8.25 % IT SOLN
INTRATHECAL | Status: AC
  Filled 2017-04-04: qty 2

## 2017-04-04 MED ORDER — SOD CITRATE-CITRIC ACID 500-334 MG/5ML PO SOLN: 30.0000 mL | ORAL | Status: DC | PRN

## 2017-04-04 MED ORDER — COCONUT OIL OIL: 1.0000 "application " | TOPICAL_OIL | Status: DC | PRN

## 2017-04-04 MED ORDER — ONDANSETRON HCL 4 MG/2ML IJ SOLN: 4.0000 mg | INTRAMUSCULAR | Status: DC | PRN

## 2017-04-04 MED ORDER — DIBUCAINE 1 % RE OINT: 1.0000 "application " | TOPICAL_OINTMENT | RECTAL | Status: DC | PRN

## 2017-04-04 MED ORDER — OXYCODONE-ACETAMINOPHEN 5-325 MG PO TABS: 2.0000 | ORAL_TABLET | ORAL | Status: DC | PRN

## 2017-04-04 MED ORDER — SIMETHICONE 80 MG PO CHEW
80.0000 mg | CHEWABLE_TABLET | ORAL | Status: DC | PRN
Start: 2017-04-04 — End: 2017-04-06

## 2017-04-04 MED ORDER — LIDOCAINE HCL (PF) 1 % IJ SOLN
INTRAMUSCULAR | Status: DC | PRN
  Administered 2017-04-04: 6 mL via EPIDURAL
  Administered 2017-04-04: 4 mL

## 2017-04-04 MED ORDER — OXYTOCIN 40 UNITS IN LACTATED RINGERS INFUSION - SIMPLE MED
2.5000 [IU]/h | INTRAVENOUS | Status: DC
  Administered 2017-04-04 (×2): 2.5 [IU]/h via INTRAVENOUS
  Filled 2017-04-04: qty 1000

## 2017-04-04 MED ORDER — BENZOCAINE-MENTHOL 20-0.5 % EX AERO: 1.0000 "application " | INHALATION_SPRAY | CUTANEOUS | Status: DC | PRN

## 2017-04-04 MED ORDER — WITCH HAZEL-GLYCERIN EX PADS: 1.0000 "application " | MEDICATED_PAD | CUTANEOUS | Status: DC | PRN

## 2017-04-04 MED ORDER — FAMOTIDINE 20 MG PO TABS
40.0000 mg | ORAL_TABLET | Freq: Once | ORAL | Status: AC
  Administered 2017-04-05: 40 mg via ORAL
  Filled 2017-04-04: qty 2

## 2017-04-04 MED ORDER — LACTATED RINGERS IV SOLN: 500.0000 mL | INTRAVENOUS | Status: DC | PRN

## 2017-04-04 MED ORDER — SENNOSIDES-DOCUSATE SODIUM 8.6-50 MG PO TABS
2.0000 | ORAL_TABLET | ORAL | Status: DC
  Administered 2017-04-05 (×2): 2 via ORAL
  Filled 2017-04-04 (×2): qty 2

## 2017-04-04 MED ORDER — LACTATED RINGERS IV SOLN: 500.0000 mL | Freq: Once | INTRAVENOUS | Status: DC

## 2017-04-04 MED ORDER — FENTANYL 2.5 MCG/ML BUPIVACAINE 1/10 % EPIDURAL INFUSION (WH - ANES)
14.0000 mL/h | INTRAMUSCULAR | Status: DC | PRN
  Administered 2017-04-04: 14 mL/h via EPIDURAL
  Filled 2017-04-04: qty 100

## 2017-04-04 MED ORDER — ONDANSETRON HCL 4 MG PO TABS: 4.0000 mg | ORAL_TABLET | ORAL | Status: DC | PRN

## 2017-04-04 MED ORDER — FENTANYL CITRATE (PF) 100 MCG/2ML IJ SOLN
INTRAMUSCULAR | Status: AC
  Filled 2017-04-04: qty 2

## 2017-04-04 MED ORDER — ONDANSETRON HCL 4 MG/2ML IJ SOLN
INTRAMUSCULAR | Status: AC
  Filled 2017-04-04: qty 2

## 2017-04-04 MED ORDER — SOD CITRATE-CITRIC ACID 500-334 MG/5ML PO SOLN: 30.0000 mL | Freq: Once | ORAL | Status: DC

## 2017-04-04 NOTE — Progress Notes (Signed)
Labor Progress Note Nicole Brooks is a 31 y.o. G3P1011 at 1970w2d presented for spontaneous onset of labor  S:  Patient reporting constant urge to push  O:  BP (!) 127/91   Pulse 88   Temp 98 F (36.7 C) (Oral)   Resp 16   Ht 5' (1.524 m)   Wt 178 lb (80.7 kg)   LMP 07/17/2016 (Approximate)   BMI 34.76 kg/m   Fetal Tracing:  Baseline: 125 Variability: moderate Accels: 15x15 Decels: variable  Toco: 2-4  CVE: Dilation: 10 Dilation Complete Date: 04/04/17 Dilation Complete Time: 1750 Effacement (%): 100 Cervical Position: Middle Station: +2, +3 Presentation: Vertex Exam by:: Rayfield Citizenaroline, CNM   A&P: 31 y.o. G3P1011 270w2d spontaneous onset of labor #Labor: Progressing well. Will labor down before pushing #Pain: epidural #FWB: Cat 1 #GBS positive  Rolm Bookbinderaroline M Emy Angevine, CNM 5:58 PM

## 2017-04-04 NOTE — Progress Notes (Signed)
Labor Progress Note Nicole Brooks is a 31 y.o. G3P1011 at 5255w2d presented for SOL S: Patient is comfortable in bed. No complaints at this time.  O:  BP 109/79   Pulse 74   Temp 97.7 F (36.5 C) (Oral)   Resp 18   Ht 5' (1.524 m)   Wt 178 lb (80.7 kg)   LMP 07/17/2016 (Approximate)   BMI 34.76 kg/m  EFM: 120/mod vari/ reactive  CVE: Dilation: 9 Effacement (%): 90 Cervical Position: Anterior Station: -2 Presentation: Vertex Exam by:: Ladona Ridgelaylor RN   A&P: 31 y.o. [id number] 3255w2d here for SOL #Labor: Progressing well. AROM #Pain: epidural #FWB: cat 2 #GBS positive  Suella BroadKeriann S Braeton Wolgamott, MD 3:27 PM

## 2017-04-04 NOTE — MAU Note (Signed)
Seen here yesterday for labor ck and was 2cm. For repeat c/s 04/17/17. ctxs stronger now with some bloody mucous

## 2017-04-04 NOTE — Anesthesia Preprocedure Evaluation (Signed)
Anesthesia Evaluation  Patient identified by MRN, date of birth, ID band Patient awake    Reviewed: Allergy & Precautions, H&P , Patient's Chart, lab work & pertinent test results  Airway Mallampati: II  TM Distance: >3 FB Neck ROM: full    Dental  (+) Teeth Intact   Pulmonary asthma ,    breath sounds clear to auscultation       Cardiovascular  Rhythm:regular Rate:Normal     Neuro/Psych    GI/Hepatic   Endo/Other    Renal/GU      Musculoskeletal   Abdominal   Peds  Hematology   Anesthesia Other Findings       Reproductive/Obstetrics (+) Pregnancy                             Anesthesia Physical Anesthesia Plan  ASA: II  Anesthesia Plan: Epidural   Post-op Pain Management:    Induction:   PONV Risk Score and Plan:   Airway Management Planned:   Additional Equipment:   Intra-op Plan:   Post-operative Plan:   Informed Consent: I have reviewed the patients History and Physical, chart, labs and discussed the procedure including the risks, benefits and alternatives for the proposed anesthesia with the patient or authorized representative who has indicated his/her understanding and acceptance.   Dental Advisory Given  Plan Discussed with:   Anesthesia Plan Comments: (Labs checked- platelets confirmed with RN in room. Fetal heart tracing, per RN, reported to be stable enough for sitting procedure. Discussed epidural, and patient consents to the procedure:  included risk of possible headache,backache, failed block, allergic reaction, and nerve injury. This patient was asked if she had any questions or concerns before the procedure started.)        Anesthesia Quick Evaluation  

## 2017-04-04 NOTE — Progress Notes (Deleted)
Delivery Note At 6:22 PM a viable female was delivered via VBAC, Spontaneous (Presentation: DOA).  APGAR: 8, 9; weight pending.   Placenta status: Spontaneous, intact.  Cord: 3 vessels with the following complications: nuchal x1, delivered through and easily reduced.   Anesthesia:  epidural Episiotomy: None Lacerations: 2nd degree;Perineal;1st degree;Vaginal Suture Repair: 2.0 vicryl rapide Est. Blood Loss (mL):  250  Mom to postpartum.  Baby to Couplet care / Skin to Skin.  Caroline M Neill CNM 04/04/2017, 6:54 PM  Please schedule this patient for PP visit in: 4 weeks Low risk pregnancy complicated by: TOLAC Delivery mode:  SVD Anticipated Birth Control:  BTL done PP PP Procedures needed: n/a  Schedule Integrated BH visit: no Provider: Any provider    

## 2017-04-04 NOTE — Progress Notes (Signed)
Labor Progress Note Heatherly L Elige RadonBradley is a 31 y.o. G3P1011 at 6081w2d presented for SOL  S:  Patient comfortable with epidural. Reports feeling gush of fluid O:  BP 125/79   Pulse 70   Temp 97.7 F (36.5 C) (Oral)   Resp 18   Ht 5' (1.524 m)   Wt 178 lb (80.7 kg)   LMP 07/17/2016 (Approximate)   BMI 34.76 kg/m   Fetal Tracing:  Baseline: 120 Variability: moderate Accels: 15x15 Decels: none  Toco: 4-6  CVE: Dilation: 9 Effacement (%): 90 Cervical Position: Anterior Station: -2 Presentation: Vertex Exam by:: Ladona Ridgelaylor RN   A&P: 31 y.o. G3P1011 2181w2d spontaneous onset of labor, TOLAC. #Labor: Progressing well. Continue expectant management to allow for adequate GBS treatment #Pain: epidural #FWB: Cat 1 #GBS positive  Rolm Bookbinderaroline M Neill, CNM 1:42 PM

## 2017-04-04 NOTE — H&P (Signed)
LABOR AND DELIVERY ADMISSION HISTORY AND PHYSICAL NOTE  Nicole Brooks is a 31 y.o. female 413P1011 with IUP at 5840w2d by LMP + first trimester U/S, and pregnancy complicated by history of prior C/S, anemia, obesity, and hx of PP depression, presenting for SOL. She reports positive fetal movement. She denies leakage of fluid or vaginal bleeding.   [facility] History/Complications[facility] Pregnancy complications:  - History of prior C/S - for fetal distress - Obesity - History of postpartum depression after 1st pregnancy - Varicella non-immune  Past Medical History: Past Medical History:  Diagnosis Date  . Asthma   . Cardiomegaly   . Trigeminal neuralgia    right    Past Surgical History: Past Surgical History:  Procedure Laterality Date  . CESAREAN SECTION N/A 02/19/2015   Procedure: CESAREAN SECTION;  Surgeon: Levie HeritageJacob J Stinson, DO;  Location: [facility];  Service: Obstetrics;  Laterality: N/A;  . DILATION AND EVACUATION  05/17/2012   Procedure: DILATATION AND EVACUATION;  Surgeon: Willodean Rosenthalarolyn Harraway-Smith, MD;  Location: [facility];  Service: Gynecology;  Laterality: N/A;  . DILATION AND EVACUATION  05/17/2012  . TOOTH EXTRACTION      Obstetrical History: OB History    Gravida Para Term Preterm AB Living   3 1 1  0 1 1   SAB TAB Ectopic Multiple Live Births   1 0 0 0 1      Social History: Social History   Social History  . Marital status: Single    Spouse name: N/A  . Number of children: 1  . Years of education: N/A   Occupational History  .      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 Smoking status: Never Smoker  . Smokeless tobacco: Never Used  . Alcohol use No  . Drug use: No  . Sexual activity: Yes    Birth control/ protection: None   Other Topics Concern  . None   Social History Narrative   Lives home with SO and 2 yr old son.  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  Drinks coffee sometimes.    Family History: Family  History  Problem Relation Age of Onset  . Diabetes Mother   . Hypertension Mother     Allergies: No Known Allergies  Prescriptions Prior to Admission  Medication Sig Dispense Refill Last Dose  . acetaminophen (TYLENOL) 325 MG tablet Take 650 mg by mouth every 6 (six) hours as needed for moderate pain.    Past Month at Unknown time  . Prenatal Vit-Fe Fumarate-FA (PRENATAL COMPLETE) 14-0.4 MG TABS Take 1 tablet by mouth daily. 60 each 0 04/03/2017 at Unknown time     Review of Systems  All systems reviewed and negative except as stated in HPI  Physical Exam Blood pressure 127/81, pulse 84, temperature 98 F (36.7 C), height 5' (1.524 m), weight 178 lb (80.7 kg), last menstrual period 07/17/2016. General appearance: alert, breathing though contractions Lungs: clear to auscultation bilaterally Heart: regular rate and rhythm Abdomen: soft, non-tender; bowel sounds normal Extremities: No calf swelling or tenderness Presentation: cephalic by SVE Fetal monitoring: 135, moderate variability, +acel, occasional early decel Uterine activity: ctx q2 min Dilation: 6 Station: +1 Exam by:: Abigail RN  Prenatal labs: ABO, Rh: --/--/A POS (11/03 [id number]) Antibody: NEG (11/03 0737) Rubella:  immune RPR:   negative HBsAg:   negative HIV:   nonreactive (02/03/17) GC/Chlamydia: negative GBS:   positive 1 hr Glucola: 87 Genetic  screening:  Negative Quad screen Anatomy US: normal (actual report not available to review)  Prenatal Transfer Tool  Maternal Diabetes: No Genetic Screening: Normal Maternal Ultrasounds/Referrals: Normal Fetal Ultrasounds or other Referrals:  None Maternal Substance Abuse:  No Significant Maternal Medications:  None Significant Maternal Lab Results: Lab values include: Group B Strep positive  Results for orders placed or performed during the hospital encounter of 04/04/17 (from the past 24 hour(s))  CBC   Collection Time: 04/04/17  7:37 AM  Result Value Ref Range    WBC 8.5 4.0 - 10.5 K/uL   RBC 4.26 3.87 - 5.11 MIL/uL   Hemoglobin 12.0 12.0 - 15.0 g/dL   HCT 16.1 09.6 - 04.5 %   MCV 85.7 78.0 - 100.0 fL   MCH 28.2 26.0 - 34.0 pg   MCHC 32.9 30.0 - 36.0 g/dL   RDW 40.9 81.1 - 91.4 %   Platelets 264 150 - 400 K/uL  Type and screen [facility]   Collection Time: 04/04/17  7:37 AM  Result Value Ref Range   ABO/RH(D) A POS    Antibody Screen NEG    Sample Expiration 04/07/2017     Patient Active Problem List   Diagnosis Date Noted  . Normal labor 04/04/2017  . S/P cesarean section 02/22/2015  . Obesity complicating pregnancy in second trimester   . History of cardiomegaly 08/29/2014  . Migraine 05/12/2012    Assessment: Nicole Brooks is a 31 y.o. G3P1011 at [redacted]w[redacted]d here for SOL. Prior Hx of C/S. Consented for TOLAC.  #Labor: expectant management #Pain: Wants Epidural #FWB: Cat I #ID:  GBS +, start PCN #MOC:BTL #Circ:  outpatient  Kandra Nicolas Aseret Hoffman 04/04/2017, 9:01 AM

## 2017-04-04 NOTE — Anesthesia Preprocedure Evaluation (Deleted)
Anesthesia Evaluation  Patient identified by MRN, date of birth, ID band Patient awake    Reviewed: Allergy & Precautions, H&P , NPO status , Patient's Chart, lab work & pertinent test results  History of Anesthesia Complications Negative for: history of anesthetic complications  Airway Mallampati: II  TM Distance: >3 FB Neck ROM: full    Dental no notable dental hx. (+) Teeth Intact   Pulmonary asthma ,    Pulmonary exam normal breath sounds clear to auscultation       Cardiovascular negative cardio ROS Normal cardiovascular exam Rhythm:regular Rate:Normal     Neuro/Psych negative neurological ROS  negative psych ROS   GI/Hepatic negative GI ROS, Neg liver ROS,   Endo/Other  Morbid obesity  Renal/GU negative Renal ROS  negative genitourinary   Musculoskeletal   Abdominal   Peds  Hematology negative hematology ROS (+)   Anesthesia Other Findings   Reproductive/Obstetrics (+) Pregnancy                             Anesthesia Physical  Anesthesia Plan  ASA: II  Anesthesia Plan: Spinal   Post-op Pain Management:    Induction:   PONV Risk Score and Plan: Ondansetron and Treatment may vary due to age or medical condition  Airway Management Planned: Natural Airway  Additional Equipment:   Intra-op Plan:   Post-operative Plan:   Informed Consent: I have reviewed the patients History and Physical, chart, labs and discussed the procedure including the risks, benefits and alternatives for the proposed anesthesia with the patient or authorized representative who has indicated his/her understanding and acceptance.     Plan Discussed with:   Anesthesia Plan Comments:        Anesthesia Quick Evaluation

## 2017-04-04 NOTE — Anesthesia Procedure Notes (Signed)
Epidural Patient location during procedure: OB  Staffing Anesthesiologist: Brand Siever  Preanesthetic Checklist Completed: patient identified, pre-op evaluation, timeout performed, IV checked, risks and benefits discussed and monitors and equipment checked  Epidural Patient position: sitting Prep: DuraPrep Patient monitoring: blood pressure and continuous pulse ox Approach: right paramedian Location: L3-L4 Injection technique: LOR air  Needle:  Needle type: Tuohy  Needle gauge: 17 G Needle insertion depth: 5 cm Catheter type: closed end flexible Catheter size: 19 Gauge Catheter at skin depth: 10 cm Test dose: negative  Assessment Sensory level: T8  Additional Notes    Dosing of Epidural:  1st dose, through catheter .............................................  Xylocaine 40 mg  2nd dose, through catheter, after waiting 3 minutes.........Xylocaine 60 mg    As each dose occurred, patient was free of IV sx; and patient exhibited no evidence of SA injection.  Patient is more comfortable after epidural dosed. Please see RN's note for documentation of vital signs,and FHR which are stable.  Patient reminded not to try to ambulate with numb legs, and that an RN must be present when she attempts to get up.         

## 2017-04-05 ENCOUNTER — Encounter (HOSPITAL_COMMUNITY): Payer: Self-pay | Admitting: Family Medicine

## 2017-04-05 ENCOUNTER — Inpatient Hospital Stay (HOSPITAL_COMMUNITY): Payer: Medicaid Other | Admitting: Anesthesiology

## 2017-04-05 ENCOUNTER — Encounter (HOSPITAL_COMMUNITY)
Admission: AD | Disposition: A | Discharge status: Home / Self Care | Payer: Self-pay | Source: Ambulatory Visit | Attending: Obstetrics and Gynecology

## 2017-04-05 DIAGNOSIS — Z302 Encounter for sterilization: Secondary | ICD-10-CM

## 2017-04-05 HISTORY — PX: TUBAL LIGATION: SHX77

## 2017-04-05 LAB — RPR: RPR: NONREACTIVE

## 2017-04-05 SURGERY — LIGATION, FALLOPIAN TUBE, POSTPARTUM
Anesthesia: Epidural | Laterality: Bilateral

## 2017-04-05 SURGERY — LIGATION, FALLOPIAN TUBE, POSTPARTUM
Anesthesia: Epidural | Site: Abdomen | Laterality: Bilateral | Wound class: Clean Contaminated

## 2017-04-05 MED ORDER — NALBUPHINE HCL 10 MG/ML IJ SOLN: 5.0000 mg | Freq: Once | INTRAMUSCULAR | Status: DC | PRN

## 2017-04-05 MED ORDER — OXYCODONE HCL 5 MG/5ML PO SOLN: 5.0000 mg | Freq: Once | ORAL | Status: DC | PRN

## 2017-04-05 MED ORDER — OXYCODONE HCL 5 MG PO TABS: 5.0000 mg | ORAL_TABLET | Freq: Once | ORAL | Status: DC | PRN

## 2017-04-05 MED ORDER — DIPHENHYDRAMINE HCL 25 MG PO CAPS: 25.0000 mg | ORAL_CAPSULE | ORAL | Status: DC | PRN

## 2017-04-05 MED ORDER — KETOROLAC TROMETHAMINE 30 MG/ML IJ SOLN
INTRAMUSCULAR | Status: AC
  Filled 2017-04-05: qty 1

## 2017-04-05 MED ORDER — BUPIVACAINE HCL (PF) 0.25 % IJ SOLN
INTRAMUSCULAR | Status: AC
  Filled 2017-04-05: qty 10

## 2017-04-05 MED ORDER — KETOROLAC TROMETHAMINE 30 MG/ML IJ SOLN
INTRAMUSCULAR | Status: DC | PRN
  Administered 2017-04-05: 30 mg via INTRAVENOUS

## 2017-04-05 MED ORDER — DEXAMETHASONE SODIUM PHOSPHATE 4 MG/ML IJ SOLN
INTRAMUSCULAR | Status: DC | PRN
Start: 2017-04-05 — End: 2017-04-06
  Administered 2017-04-05: 4 mg via INTRAVENOUS

## 2017-04-05 MED ORDER — NALOXONE HCL 2 MG/2ML IJ SOSY
1.0000 ug/kg/h | PREFILLED_SYRINGE | INTRAVENOUS | Status: DC | PRN
  Filled 2017-04-05: qty 2

## 2017-04-05 MED ORDER — LIDOCAINE-EPINEPHRINE (PF) 2 %-1:200000 IJ SOLN
INTRAMUSCULAR | Status: AC
  Filled 2017-04-05: qty 20

## 2017-04-05 MED ORDER — HYDROMORPHONE HCL 1 MG/ML IJ SOLN: 0.2500 mg | INTRAMUSCULAR | Status: DC | PRN

## 2017-04-05 MED ORDER — NALOXONE HCL 0.4 MG/ML IJ SOLN: 0.4000 mg | INTRAMUSCULAR | Status: DC | PRN

## 2017-04-05 MED ORDER — NALBUPHINE HCL 10 MG/ML IJ SOLN: 5.0000 mg | INTRAMUSCULAR | Status: DC | PRN

## 2017-04-05 MED ORDER — DIPHENHYDRAMINE HCL 50 MG/ML IJ SOLN: 12.5000 mg | INTRAMUSCULAR | Status: DC | PRN

## 2017-04-05 MED ORDER — PROMETHAZINE HCL 25 MG/ML IJ SOLN: 6.2500 mg | INTRAMUSCULAR | Status: DC | PRN

## 2017-04-05 MED ORDER — SODIUM BICARBONATE 8.4 % IV SOLN
INTRAVENOUS | Status: DC | PRN
  Administered 2017-04-05: 5 mL via EPIDURAL
  Administered 2017-04-05: 3 mL via EPIDURAL
  Administered 2017-04-05: 5 mL via EPIDURAL
  Administered 2017-04-05: 2 mL via EPIDURAL

## 2017-04-05 MED ORDER — MIDAZOLAM HCL 5 MG/5ML IJ SOLN
INTRAMUSCULAR | Status: DC | PRN
  Administered 2017-04-05 (×2): 1 mg via INTRAVENOUS

## 2017-04-05 MED ORDER — BUPIVACAINE HCL (PF) 0.25 % IJ SOLN
INTRAMUSCULAR | Status: DC | PRN
  Administered 2017-04-05: 10 mL

## 2017-04-05 MED ORDER — SCOPOLAMINE 1 MG/3DAYS TD PT72
1.0000 | MEDICATED_PATCH | Freq: Once | TRANSDERMAL | Status: DC
  Filled 2017-04-05: qty 1

## 2017-04-05 MED ORDER — LACTATED RINGERS IV SOLN
INTRAVENOUS | Status: DC | PRN
  Administered 2017-04-05: 11:00:00 via INTRAVENOUS

## 2017-04-05 MED ORDER — SODIUM CHLORIDE 0.9% FLUSH: 3.0000 mL | INTRAVENOUS | Status: DC | PRN

## 2017-04-05 MED ORDER — ONDANSETRON HCL 4 MG/2ML IJ SOLN
INTRAMUSCULAR | Status: DC | PRN
  Administered 2017-04-05: 4 mg via INTRAVENOUS

## 2017-04-05 MED ORDER — MEPERIDINE HCL 25 MG/ML IJ SOLN: 6.2500 mg | INTRAMUSCULAR | Status: DC | PRN

## 2017-04-05 MED ORDER — ONDANSETRON HCL 4 MG/2ML IJ SOLN
INTRAMUSCULAR | Status: AC
  Filled 2017-04-05: qty 2

## 2017-04-05 MED ORDER — ONDANSETRON HCL 4 MG/2ML IJ SOLN: 4.0000 mg | Freq: Three times a day (TID) | INTRAMUSCULAR | Status: DC | PRN

## 2017-04-05 MED ORDER — SODIUM BICARBONATE 8.4 % IV SOLN
INTRAVENOUS | Status: AC
  Filled 2017-04-05: qty 50

## 2017-04-05 MED ORDER — FENTANYL CITRATE (PF) 100 MCG/2ML IJ SOLN
INTRAMUSCULAR | Status: AC
  Filled 2017-04-05: qty 2

## 2017-04-05 MED ORDER — MIDAZOLAM HCL 2 MG/2ML IJ SOLN
INTRAMUSCULAR | Status: AC
  Filled 2017-04-05: qty 2

## 2017-04-05 MED ORDER — FENTANYL CITRATE (PF) 100 MCG/2ML IJ SOLN
INTRAMUSCULAR | Status: DC | PRN
  Administered 2017-04-05 (×2): 50 ug via INTRAVENOUS

## 2017-04-05 SURGICAL SUPPLY — 23 items
ADH SKN CLS APL DERMABOND .7 (GAUZE/BANDAGES/DRESSINGS) ×1
CLIP FILSHIE TUBAL LIGA STRL (Clip) ×2 IMPLANT
CLOTH BEACON ORANGE TIMEOUT ST (SAFETY) ×2 IMPLANT
DERMABOND ADVANCED (GAUZE/BANDAGES/DRESSINGS) ×1
DERMABOND ADVANCED .7 DNX12 (GAUZE/BANDAGES/DRESSINGS) IMPLANT
DRSG OPSITE POSTOP 3X4 (GAUZE/BANDAGES/DRESSINGS) ×2 IMPLANT
DURAPREP 26ML APPLICATOR (WOUND CARE) ×2 IMPLANT
GLOVE BIOGEL PI IND STRL 7.0 (GLOVE) ×2 IMPLANT
GLOVE BIOGEL PI INDICATOR 7.0 (GLOVE) ×2
GLOVE ECLIPSE 7.0 STRL STRAW (GLOVE) ×4 IMPLANT
GOWN STRL REUS W/TWL LRG LVL3 (GOWN DISPOSABLE) ×4 IMPLANT
NEEDLE HYPO 22GX1.5 SAFETY (NEEDLE) ×2 IMPLANT
NS IRRIG 1000ML POUR BTL (IV SOLUTION) ×2 IMPLANT
PACK ABDOMINAL MINOR (CUSTOM PROCEDURE TRAY) ×2 IMPLANT
PROTECTOR NERVE ULNAR (MISCELLANEOUS) ×2 IMPLANT
SPONGE LAP 4X18 X RAY DECT (DISPOSABLE) IMPLANT
SUT VIC AB 0 CT1 27 (SUTURE) ×2
SUT VIC AB 0 CT1 27XBRD ANBCTR (SUTURE) ×1 IMPLANT
SUT VICRYL 4-0 PS2 18IN ABS (SUTURE) ×2 IMPLANT
SYR CONTROL 10ML LL (SYRINGE) ×2 IMPLANT
TOWEL OR 17X24 6PK STRL BLUE (TOWEL DISPOSABLE) ×4 IMPLANT
TRAY FOLEY CATH SILVER 14FR (SET/KITS/TRAYS/PACK) ×2 IMPLANT
WATER STERILE IRR 1000ML POUR (IV SOLUTION) ×2 IMPLANT

## 2017-04-05 NOTE — Progress Notes (Signed)
Post Partum Day 1 Subjective: no complaints, up ad lib, voiding and tolerating PO  Objective: Blood pressure 123/84, pulse 75, temperature 99.1 F (37.3 C), temperature source Oral, resp. rate 18, height 5' (1.524 m), weight 178 lb (80.7 kg), last menstrual period 07/17/2016, SpO2 99 %, unknown if currently breastfeeding.  Physical Exam:  General: alert, cooperative and appears stated age Lochia: appropriate Uterine Fundus: firm DVT Evaluation: No evidence of DVT seen on physical exam.  Recent Labs    04/04/17 0737  HGB 12.0  HCT 36.5    Assessment/Plan: Plan for discharge tomorrow, Breastfeeding and Contraception pp BTL Outpatient circumcision   LOS: 1 day   Reva Boresanya S Mikinzie Maciejewski 04/05/2017, 10:06 AM

## 2017-04-05 NOTE — Anesthesia Postprocedure Evaluation (Signed)
Anesthesia Post Note  Patient: Sherol DadeMia L Pieri  Procedure(s) Performed: AN AD HOC EPIDURAL     Patient location during evaluation: Mother Baby Anesthesia Type: Epidural Level of consciousness: awake and alert, oriented and patient cooperative Pain management: pain level controlled Vital Signs Assessment: post-procedure vital signs reviewed and stable Respiratory status: spontaneous breathing Cardiovascular status: stable Postop Assessment: no headache, epidural receding, patient able to bend at knees and no signs of nausea or vomiting Anesthetic complications: no Comments: Pt states she has pain in her back from epidural.  States she had back pain prior to hospital admission.  States epidural worked well and she was satisfied.    Last Vitals:  Vitals:   04/05/17 0010 04/05/17 0537  BP: 111/64 110/66  Pulse: 79 77  Resp: 20 20  Temp: 37.3 C 36.7 C  SpO2: 99% 99%    Last Pain:  Vitals:   04/05/17 0538  TempSrc:   PainSc: 0-No pain   Pain Goal: Patients Stated Pain Goal: 0 (04/04/17 [id number])               Merrilyn PumaWRINKLE,Tremel Setters

## 2017-04-05 NOTE — Anesthesia Preprocedure Evaluation (Signed)
Anesthesia Evaluation  Patient identified by MRN, date of birth, ID band Patient awake    Reviewed: Allergy & Precautions, H&P , Patient's Chart, lab work & pertinent test results  Airway Mallampati: II  TM Distance: >3 FB Neck ROM: full    Dental  (+) Teeth Intact   Pulmonary asthma ,    breath sounds clear to auscultation       Cardiovascular  Rhythm:regular Rate:Normal     Neuro/Psych    GI/Hepatic   Endo/Other    Renal/GU      Musculoskeletal   Abdominal   Peds  Hematology   Anesthesia Other Findings       Reproductive/Obstetrics (+) Pregnancy                             Anesthesia Physical  Anesthesia Plan  ASA: II  Anesthesia Plan: Epidural   Post-op Pain Management:    Induction:   PONV Risk Score and Plan: 2  Airway Management Planned: Natural Airway  Additional Equipment:   Intra-op Plan:   Post-operative Plan:   Informed Consent: I have reviewed the patients History and Physical, chart, labs and discussed the procedure including the risks, benefits and alternatives for the proposed anesthesia with the patient or authorized representative who has indicated his/her understanding and acceptance.   Dental Advisory Given and Dental advisory given  Plan Discussed with:   Anesthesia Plan Comments:         Anesthesia Quick Evaluation

## 2017-04-05 NOTE — Interval H&P Note (Signed)
History and Physical Interval Note:  04/05/2017 10:05 AM  Nicole Brooks  has presented today for surgery, with the diagnosis of Elective Post Partum Tubal Ligation no s/p SVD   The various methods of treatment have been discussed with the patient and family. After consideration of risks, benefits and other options for treatment, the patient has consented to  Procedure(s): POST PARTUM TUBAL LIGATION (Bilateral) as a surgical intervention. Patient counseled, r.e. Risks benefits of BTL, including permanency of procedure, risk of failure(1:100), increased risk of ectopic.  Patient verbalized understanding and desires to proceed  The patient's history has been reviewed, patient examined, no change in status, stable for surgery.  I have reviewed the patient's chart and labs.  Questions were answered to the patient's satisfaction.     Reva Boresanya S Pratt

## 2017-04-05 NOTE — Addendum Note (Signed)
Addendum  created 04/05/17 0742 by Angela AdamWrinkle, Eldred Sooy G, CRNA   Charge Capture section accepted, Sign clinical note

## 2017-04-05 NOTE — Progress Notes (Signed)
CSW attempted to meet with MOB at bedside to assess hx of PPD. Upon this writers arrival, CSW was informed by FOB and other family/friend visitors that she is in her tubal procedure. CSW will attempt to meet with MOB at a later time.   Nicole Brooks, MSW, LCSW-A Clinical Social Worker  [facility]  Office: [phone]     

## 2017-04-05 NOTE — Op Note (Signed)
Nicole Brooks  04/05/2017  PREOPERATIVE DIAGNOSIS:  Multiparity, undesired fertility  POSTOPERATIVE DIAGNOSIS:  Multiparity, undesired fertility  PROCEDURE:  Postpartum Bilateral Tubal Sterilization using Filshie Clips   ANESTHESIA:  Epidural and local analgesia using 0.25% Marcaine  COMPLICATIONS:  None immediate.  ESTIMATED BLOOD LOSS: 25 ml.  INDICATIONS: 31 y.o. [id number]  with undesired fertility,status post vaginal delivery, desires permanent sterilization.  Other reversible forms of contraception were discussed with patient; she declines all other modalities. Risks of procedure discussed with patient including but not limited to: risk of regret, permanence of method, bleeding, infection, injury to surrounding organs and need for additional procedures.  Failure risk of 0.5-1% with increased risk of ectopic gestation if pregnancy occurs was also discussed with patient.     FINDINGS:  Normal uterus, tubes, and ovaries.  PROCEDURE DETAILS: The patient was taken to the operating room where her spinal anesthesia was dosed up to surgical level and found to be adequate.  She was then placed in a supine position and prepped and draped in the usual sterile fashion.  After an adequate timeout was performed, attention was turned to the patient's abdomen where a small transverse skin incision was made under the umbilical fold. The incision was taken down to the layer of fascia using the scalpel, and fascia was incised, and extended bilaterally. The peritoneum was entered in a sharp fashion. The patient was placed in Trendelenburg.  The left fallopian tube was identified and grasped with a Babcock clamp, and followed out to the fimbriated end.  A Filshie clip was placed on the left fallopian tube about 2 cm from the cornu.  A similar process was carried out on the right side allowing for bilateral tubal sterilization.  Good hemostasis was noted overall.  Local analgesia was injected into both Filshie  application sites.The instruments were then removed from the patient's abdomen and the fascial incision was repaired with 0 Vicryl, and the skin was closed with a 4-0 Vicryl subcuticular stitch. The patient tolerated the procedure well.  Sponge, lap, and needle counts were correct times two.  The patient was then taken to the recovery room awake, extubated and in stable condition.  Reva Boresanya S Yaser Harvill MD 04/05/2017 11:24 AM

## 2017-04-06 ENCOUNTER — Encounter (HOSPITAL_COMMUNITY): Payer: Self-pay

## 2017-04-06 ENCOUNTER — Encounter (HOSPITAL_COMMUNITY): Payer: Self-pay | Admitting: Family Medicine

## 2017-04-06 MED ORDER — SENNOSIDES-DOCUSATE SODIUM 8.6-50 MG PO TABS: 2.0000 | ORAL_TABLET | ORAL | 0 refills | Status: DC

## 2017-04-06 MED ORDER — IBUPROFEN 600 MG PO TABS: 600.0000 mg | ORAL_TABLET | Freq: Four times a day (QID) | ORAL | 1 refills | Status: DC | PRN

## 2017-04-06 NOTE — Transfer of Care (Signed)
Immediate Anesthesia Transfer of Care Note  Patient: Nicole Brooks  Procedure(s) Performed: POST PARTUM TUBAL LIGATION (Bilateral Abdomen)  Patient Location: PACU  Anesthesia Type:Epidural  Level of Consciousness: awake, alert  and oriented  Airway & Oxygen Therapy: Patient Spontanous Breathing  Post-op Assessment: Report given to RN and Post -op Vital signs reviewed and stable  Post vital signs: Reviewed and stable  Last Vitals:  Vitals:   04/06/17 0142 04/06/17 0514  BP: 114/64 130/76  Pulse: 73 71  Resp: 14 16  Temp: 36.6 C 37.1 C  SpO2:      Last Pain:  Vitals:   04/06/17 1134  TempSrc:   PainSc: 6       Patients Stated Pain Goal: 0 (04/04/17 [id number])  Complications: No apparent anesthesia complications

## 2017-04-06 NOTE — Progress Notes (Signed)
CSW received consult for hx PP Depression.  CSW met with MOB to offer support and complete assessment.    When CSW arrived, MOB was resting on the couch bonding with infant.  CSW explained CSW's role and MOB gave CSW permission to meet with CSW while MOB's mother was present. MOB was attentive to infant during CSW's assessment and MOB's mother was not engaged.    CSW asked about MOB's hx with PPD and MOB acknowledged MOB experienced PPD with MOB's first child Nicole Brooks 02/19/2015). MOB disclosed daily crying, suicide ideations, and feelings of hopelessness.  MOB shared that MOB was a first time mother at the time and was unable to breastfeed like MOB had intended.  MOB stated "My symptoms did not last long, however, I did not share with anyone how I was feeling."  MOB shared feelings of being ashamed and didn't want to be judge.  CSW normalized MOB's past thoughts and feelings and encouraged MOB to seek help and understand that PPD is  "REAL."  MOB voiced feelings of understanding and agreed to seek help if needed.   CSW provided education regarding the baby blues period vs. perinatal mood disorders, discussed treatment and gave resources for mental health follow up if concerns arise.  CSW recommends self-evaluation during the postpartum time period using the New Mom Checklist from Postpartum Progress and encouraged MOB to contact a medical professional if symptoms are noted at any time.  MOB did not present with any acute symptoms and appeared to have insight and awareness.  MOB also shared a list of supporters that will be willing to provide care and assist MOB if needed.   CSW identifies no further need for intervention and no barriers to discharge at this time.  Nicole Brooks, MSW, LCSW Clinical Social Work [phone]

## 2017-04-06 NOTE — Discharge Summary (Signed)
OB Discharge Summary     Patient Name: Nicole Brooks DOB: 09/07/1985 MRN: [id number]  Date of admission: 04/04/2017 Delivering MD: Rolm Bookbinder   Date of discharge: 04/06/2017  Admitting diagnosis: 37 WKS CTX EVERY 3 MIN RCS undesired Fertility Elective Post Partum Tubal Ligation  Desires sterilization Intrauterine pregnancy: [redacted]w[redacted]d     Secondary diagnosis:  Active Problems:   Normal labor   Encounter for sterilization  Additional problems: Hx of postpartum depression, Hx of cardiomegaly, prev c-section     Discharge diagnosis: Term Pregnancy Delivered and VBAC                                                                                                Post partum procedures:BTL  Augmentation: AROM  Complications: None  Hospital course:  Onset of Labor With Vaginal Delivery     31 y.o. yo [id number] at [redacted]w[redacted]d was admitted in Latent Labor on 04/04/2017. Patient had an uncomplicated labor course as follows:  Membrane Rupture Time/Date: 3:23 PM ,04/04/2017   Intrapartum Procedures: Episiotomy: None [1]                                         Lacerations:  2nd degree [3];Perineal [11];1st degree [2];Vaginal [6]  Patient had a delivery of a Viable infant. 04/04/2017  Information for the patient's newborn:  Nicole, Brooks [[id number]]  Delivery Method: VBAC, Spontaneous(Filed from Delivery Summary)    Pateint had an uncomplicated postpartum course. On PPD#1 she underwent an elective ppBTL which was tolerated well. She is ambulating, tolerating a regular diet, passing flatus, and urinating well. Patient is discharged home in stable condition on 04/06/17. She had a SW consult prior to d/c due to hx of PPD.   Physical exam  Vitals:   04/05/17 1757 04/05/17 2144 04/06/17 0142 04/06/17 0514  BP: 116/60 115/68 114/64 130/76  Pulse: 77 73 73 71  Resp: 18 16 14 16   Temp: 99 F (37.2 C) 98.9 F (37.2 C) 97.8 F (36.6 C) 98.7 F (37.1 C)  TempSrc: Oral Oral Oral   SpO2:       Weight:      Height:       General: alert, cooperative and no distress Lochia: appropriate Uterine Fundus: firm Incision: N/A DVT Evaluation: No evidence of DVT seen on physical exam. No cords or calf tenderness. Labs: Lab Results  Component Value Date   WBC 8.5 04/04/2017   HGB 12.0 04/04/2017   HCT 36.5 04/04/2017   MCV 85.7 04/04/2017   PLT 264 04/04/2017   CMP Latest Ref Rng & Units 08/31/2016  Glucose 65 - 99 mg/dL 97  BUN 6 - 20 mg/dL 14  Creatinine 6.96 - 2.95 mg/dL 2.84  Sodium 132 - 440 mmol/L 138  Potassium 3.5 - 5.1 mmol/L 3.0(L)  Chloride 101 - 111 mmol/L 107  CO2 22 - 32 mmol/L 24  Calcium 8.9 - 10.3 mg/dL 9.2    Discharge instruction: per After Visit Summary and "Baby and Me  Booklet".  After visit meds:  Allergies as of 04/06/2017   No Known Allergies     Medication List    STOP taking these medications   PRENATAL COMPLETE 14-0.4 MG Tabs   TYLENOL 325 MG tablet Generic drug:  acetaminophen     TAKE these medications   ibuprofen 600 MG tablet Commonly known as:  ADVIL,MOTRIN Take 1 tablet (600 mg total) every 6 (six) hours as needed by mouth.   senna-docusate 8.6-50 MG tablet Commonly known as:  Senokot-S Take 2 tablets daily by mouth. Start taking on:  04/07/2017       Diet: routine diet  Activity: Advance as tolerated. Pelvic rest for 6 weeks.   Outpatient follow up:2 weeks for mental health checkup due to hx PPD Follow up Appt: Future Appointments  Date Time Provider Department Center  07/29/2017  1:30 PM Penumalli, Glenford BayleyVikram R, MD [facility] None   Follow up Visit:No Follow-up on file.  Postpartum contraception: Tubal Ligation - s/p Newborn Data: Live born female  Birth Weight: 5 lb 7.5 oz (2481 g) APGAR: 8, 9  Newborn Delivery   Birth date/time:  04/04/2017 18:22:00 Delivery type:  VBAC, Spontaneous     Baby Feeding: Breast Disposition:home with mother   04/06/2017 Arlyce Harmanimothy Lockamy, DO  CNM attestation I have seen and  examined this patient and agree with above documentation in the resident's note.   Nicole Brooks is a 31 y.o. [id number] s/p VBAC/ppBTL.   Pain is well controlled.  Plan for birth control is bilateral tubal ligation.  Method of Feeding: breast  PE:  BP 130/76 (BP Location: Left Arm)   Pulse 71   Temp 98.7 F (37.1 C)   Resp 16   Ht 5' (1.524 m)   Wt 80.7 kg (178 lb)   LMP 07/17/2016 (Approximate)   SpO2 98%   Breastfeeding? Unknown   BMI 34.76 kg/m  Fundus firm  No results for input(s): HGB, HCT in the last 72 hours.   Plan: discharge today - postpartum care discussed - f/u clinic in 2 weeks for mental health check due to hx PPD, then at 4wks for PP visit   SHAW, KIMBERLY, CNM 12:03 AM

## 2017-04-06 NOTE — Discharge Instructions (Signed)

## 2017-04-06 NOTE — Anesthesia Postprocedure Evaluation (Signed)
Anesthesia Post Note  Patient: Nicole DadeMia L Musson  Procedure(s) Performed: POST PARTUM TUBAL LIGATION (Bilateral Abdomen)     Anesthesia Post Evaluation  Last Vitals:  Vitals:   04/06/17 0142 04/06/17 0514  BP: 114/64 130/76  Pulse: 73 71  Resp: 14 16  Temp: 36.6 C 37.1 C  SpO2:      Last Pain:  Vitals:   04/06/17 1134  TempSrc:   PainSc: 6                  Lowella CurbWarren Ray Kerria Sapien

## 2017-04-13 ENCOUNTER — Telehealth: Payer: Self-pay | Admitting: Diagnostic Neuroimaging

## 2017-04-13 DIAGNOSIS — G500 Trigeminal neuralgia: Secondary | ICD-10-CM

## 2017-04-13 MED ORDER — CARBAMAZEPINE ER 200 MG PO CP12: 200.0000 mg | ORAL_CAPSULE | Freq: Two times a day (BID) | ORAL | 6 refills | Status: DC

## 2017-04-13 NOTE — Telephone Encounter (Signed)
Spoke to pt and she is not breastfeeding.  Would you want to start gabapentin or carbamazepine now or have tests done first. Please advise.

## 2017-04-13 NOTE — Telephone Encounter (Signed)
Spoke to pt relayed that Dr. Marjory LiesPenumalli ordered MRI , facility to call and set up after authorized.  Start carbamazepine 200mg  po BID and made earlier f/u in 05-13-17 at 1000.  She verbalized udnerstanding.

## 2017-04-13 NOTE — Telephone Encounter (Signed)
Patient still has facial pain and would like a Rx for pain called to [facility] on [address]. She had her baby last week.

## 2017-04-13 NOTE — Telephone Encounter (Signed)
  PLAN: - order MRI brain. - rx for carbamazepine 200mg  twice a day.  - Follow up in clinic in 1-2 months.   Orders Placed This Encounter  Procedures  . MR BRAIN W WO CONTRAST   Meds ordered this encounter  Medications  . carbamazepine (CARBATROL) 200 MG 12 hr capsule    Sig: Take 1 capsule (200 mg total) 2 (two) times daily by mouth.    Dispense:  60 capsule    Refill:  6   Suanne MarkerVIKRAM R. PENUMALLI, MD 04/13/2017, 4:36 PM Certified in Neurology, Neurophysiology and Neuroimaging  [facility] [address], [address] [place], [place] [address] [phone]

## 2017-04-13 NOTE — Addendum Note (Signed)
Addended by: Joycelyn SchmidPENUMALLI, Jessicca Stitzer R on: 04/13/2017 04:39 PM   Modules accepted: Orders

## 2017-04-17 ENCOUNTER — Inpatient Hospital Stay (HOSPITAL_COMMUNITY): Admit: 2017-04-17 | Payer: Medicaid Other | Admitting: Obstetrics & Gynecology

## 2017-04-25 ENCOUNTER — Ambulatory Visit
Admission: RE | Admit: 2017-04-25 | Discharge: 2017-04-25 | Disposition: A | Discharge status: Home / Self Care | Payer: Medicaid Other | Source: Ambulatory Visit | Attending: Diagnostic Neuroimaging | Admitting: Diagnostic Neuroimaging

## 2017-04-25 DIAGNOSIS — G500 Trigeminal neuralgia: Secondary | ICD-10-CM | POA: Diagnosis not present

## 2017-04-25 MED ORDER — GADOBENATE DIMEGLUMINE 529 MG/ML IV SOLN
15.0000 mL | Freq: Once | INTRAVENOUS | Status: AC | PRN
  Administered 2017-04-25: 15 mL via INTRAVENOUS

## 2017-04-28 ENCOUNTER — Telehealth: Payer: Self-pay | Admitting: *Deleted

## 2017-04-28 NOTE — Telephone Encounter (Signed)
I spoke to pt and relayed the MRI results : normal per Dr. Marjory LiesPenumalli.  She verbalized understanding  She noted some dizziness with the carbamazepine (which is transient), she has been on carbamazepine for about 2 wks.  She will continue to take and monitor and will call back as needed.

## 2017-04-28 NOTE — Telephone Encounter (Signed)
-----   Message from Vikram R Penumalli, MD sent at 04/27/2017 11:09 AM EST ----- Normal imaging results. Please call patient. Continue current plan. -VRP 

## 2017-05-13 ENCOUNTER — Ambulatory Visit: Payer: Self-pay | Admitting: Diagnostic Neuroimaging

## 2017-05-15 ENCOUNTER — Telehealth: Payer: Self-pay | Admitting: Diagnostic Neuroimaging

## 2017-05-15 NOTE — Telephone Encounter (Signed)
Per Dr Marjory LiesPenumalli, spoke with patient and advised her to increase Carbamazepine to 400 mg twice daily. Advised she can discuss how she is feeling when she sees him for FU next Thursday. Patient verbalized understanding, appreciation.

## 2017-05-15 NOTE — Telephone Encounter (Signed)
Agree. -VRP 

## 2017-05-15 NOTE — Telephone Encounter (Signed)
Pt is calling for medication for mouth pain. carbamazepine (CARBATROL) 200 MG 12 hr capsule was working but for the past couple of days the pain has started worse again. Please call

## 2017-05-21 ENCOUNTER — Encounter: Payer: Self-pay | Admitting: Diagnostic Neuroimaging

## 2017-05-21 ENCOUNTER — Ambulatory Visit: Payer: Medicaid Other | Admitting: Diagnostic Neuroimaging

## 2017-05-21 VITALS — BP 130/91 | HR 72 | Wt 172.0 lb

## 2017-05-21 DIAGNOSIS — G500 Trigeminal neuralgia: Secondary | ICD-10-CM | POA: Diagnosis not present

## 2017-05-21 MED ORDER — CARBAMAZEPINE ER 300 MG PO CP12: 600.0000 mg | ORAL_CAPSULE | Freq: Two times a day (BID) | ORAL | 12 refills | Status: DC

## 2017-05-21 MED ORDER — INDOMETHACIN 50 MG PO CAPS: 50.0000 mg | ORAL_CAPSULE | Freq: Two times a day (BID) | ORAL | 0 refills | Status: DC

## 2017-05-21 NOTE — Patient Instructions (Addendum)
-   increase carbamazepine to 600mg  twice a day  - indomethacin 50mg  twice a day x 1 month  - stop ibuprofen

## 2017-05-21 NOTE — Progress Notes (Signed)
[facility]  PATIENT: Nicole DadeMia L Brooks DOB: June 24, 1985  REFERRING CLINICIAN: S Rehm HISTORY FROM: patient  REASON FOR VISIT: follow up   HISTORICAL  CHIEF COMPLAINT:  Chief Complaint  Patient presents with  . Right trigeminal neuralgia    rm 7, "delivered baby 6 wks ago; right facila pain comes and goes, occas sharp pain, cold weather makes it worse, occas my ears hurt at the same time"  . Follow-up    post partum    HISTORY OF PRESENT ILLNESS:   UPDATE (05/21/17, VRP): Since last visit, doing about the same. Now on CBZ 400mg  twice a day x 1 week, without benefit. Daily sharp attacks in right face. No alleviating factors. Biting hard food, brushing teeth, drinking cold liquids aggravates pain. Also has pain without any provoking factors.   UPDATE (03/27/17, VRP): Since last visit, was doing better, but now pain returned. Taking tylenol without relief. C-section scheduled for 04/17/17 (now [redacted] weeks EGA). Cold and hard food still are aggravating factors.   PRIOR HPI (12/08/16, VRP): 31 year old female with right facial pain. Patient is currently [redacted] weeks pregnant.  In April 2018 patient had onset of pain in her right lower jaw region. Her dentist found tooth #30 was tender to percussion. Therefore she had extraction of tooth #30 on 10/17/16. However symptoms continued. Patient had tooth #29 removed after 1 week. However symptoms continued to persist. Patient feels pain in her right ear, right jaw, tenderness to touch in her right face. Cold drinks and hard foods aggravate the pain. Patient had similar problems in 2016 during her last pregnancy. Symptoms resolved spontaneously at that time.  No problems with left face. No problems with arms or legs. No other specific triggering or aggravating factors.  [facility]  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    REVIEW OF SYSTEMS:  Full 14 system review of systems performed and negative with exception of: headache.    ALLERGIES: No Known Allergies  HOME MEDICATIONS: Outpatient Medications Prior to Visit  Medication Sig Dispense Refill  . carbamazepine (CARBATROL) 200 MG 12 hr capsule Take 1 capsule (200 mg total) 2 (two) times daily by mouth. 60 capsule 6  . ibuprofen (ADVIL,MOTRIN) 600 MG tablet Take 1 tablet (600 mg total) every 6 (six) hours as needed by mouth. 40 tablet 1  . senna-docusate (SENOKOT-S) 8.6-50 MG tablet Take 2 tablets daily by mouth. 6 tablet 0   No facility-administered medications prior to visit.     PAST MEDICAL HISTORY: Past Medical History:  Diagnosis Date  . Asthma   . Cardiomegaly   . Trigeminal neuralgia    right    PAST SURGICAL HISTORY: Past Surgical History:  Procedure Laterality Date  . CESAREAN SECTION N/A 02/19/2015   Procedure: CESAREAN SECTION;  Surgeon: Levie HeritageJacob J Stinson, DO;  Location: [facility];  Service: Obstetrics;  Laterality: N/A;  . DILATION AND EVACUATION  05/17/2012   Procedure: DILATATION AND EVACUATION;  Surgeon: Willodean Rosenthalarolyn Harraway-Smith, MD;  Location: [facility];  Service: Gynecology;  Laterality: N/A;  . DILATION AND EVACUATION  05/17/2012  . TOOTH EXTRACTION    . TUBAL LIGATION Bilateral 04/05/2017   Procedure: POST PARTUM TUBAL LIGATION;  Surgeon: Reva BoresPratt, Tanya S, MD;  [facility];  Service: Gynecology;  Laterality: Bilateral;    FAMILY HISTORY: Family History  Problem Relation Age of Onset  . Diabetes Mother   . Hypertension Mother  SOCIAL HISTORY:  Social History   Socioeconomic History  . Marital status: Single    Spouse name: Not on file  . Number of children: 1  . Years of education: Not on file  . Highest education level: Not on file  Social Needs  . Financial resource strain: Not on file  . Food insecurity - worry: Not on file  . Food insecurity - inability: Not on file  . Transportation needs - medical: Not on file  .  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 Smoking status: Never Smoker  . Smokeless tobacco: Never Used  Substance and Sexual Activity  . Alcohol use: No  . Drug use: No  . Sexual activity: Yes    Birth control/protection: None  Other Topics Concern  . Not on file  Social History Narrative   Lives home with SO and 2 yr old son.  Works at [employer]   .  Drinks coffee sometimes.     PHYSICAL EXAM  GENERAL EXAM/CONSTITUTIONAL: Vitals:  Vitals:   05/21/17 0806  BP: (!) 130/91  Pulse: 72  Weight: 172 lb (78 kg)   Body mass index is 33.59 kg/m. No exam data present  Patient is in no distress; well developed, nourished and groomed; neck is supple  CARDIOVASCULAR:  Examination of carotid arteries is normal; no carotid bruits  Regular rate and rhythm, no murmurs  Examination of peripheral vascular system by observation and palpation is normal  EYES:  Ophthalmoscopic exam of optic discs and posterior segments is normal; no papilledema or hemorrhages  MUSCULOSKELETAL:  Gait, strength, tone, movements noted in Neurologic exam below  NEUROLOGIC: MENTAL STATUS:  No flowsheet data found.  awake, alert, oriented to person, place and time  recent and remote memory intact  normal attention and concentration  language fluent, comprehension intact, naming intact,   fund of knowledge appropriate  CRANIAL NERVE:   2nd - no papilledema on fundoscopic exam  2nd, 3rd, 4th, 6th - pupils equal and reactive to light, visual fields full to confrontation, extraocular muscles intact, no nystagmus  5th - facial sensation symmetric  7th - facial strength symmetric  8th - hearing intact  9th - palate elevates symmetrically, uvula midline  11th - shoulder shrug symmetric  12th - tongue protrusion midline  MOTOR:   normal bulk and tone, full strength in the  BUE, BLE  SENSORY:   normal and symmetric to light touch  COORDINATION:   finger-nose-finger, fine finger movements normal  REFLEXES:   deep tendon reflexes present and symmetric  GAIT/STATION:   narrow based gait    DIAGNOSTIC DATA (LABS, IMAGING, TESTING) - I reviewed patient records, labs, notes, testing and imaging myself where available.  Lab Results  Component Value Date   WBC 8.5 04/04/2017   HGB 12.0 04/04/2017   HCT 36.5 04/04/2017   MCV 85.7 04/04/2017   PLT 264 04/04/2017      Component Value Date/Time   NA 138 08/31/2016 1402   K 3.0 (L) 08/31/2016 1402   CL 107 08/31/2016 1402   CO2 24 08/31/2016 1402   GLUCOSE 97 08/31/2016 1402   BUN 14 08/31/2016 1402   CREATININE 0.62 08/31/2016 1402   CALCIUM 9.2 08/31/2016 1402   GFRNONAA >60 08/31/2016 1402   GFRAA >60 08/31/2016 1402   No results found for: CHOL, HDL, LDLCALC, LDLDIRECT, TRIG, CHOLHDL No results found for:  HGBA1C No results found for: VITAMINB12 No results found for: TSH  04/25/17 MRI of the brain with and without contrast shows the following: [I reviewed images myself and agree with interpretation. -VRP]  1.    The brain appears normal before and after contrast. 2.    The sella turcica is enlarged but pituitary tissue has normal contour and is top normal in height.   The pituitary enhances homogenously. 3.    The course of the trigeminal nerves appear normal.    ASSESSMENT AND PLAN  31 y.o. year old female here with Right facial pain in V2 and V3 distribution of trigeminal nerve, with dental extractions #29 and  #30 teeth, without improvement in symptoms. 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    Dx:  1. Right trigeminal neuralgia      PLAN:  I spent 25 minutes of face to face time with patient. Greater than 50% of time was spent in counseling and coordination of care with patient. In summary we discussed:  - increase CBZ to 600mg  twice a day - indomethacin 50mg   twice a day x 1 month - stop ibuprofen - may consider gabapentin, amitriptyline or baclofen in future  Return in about 3 months (around 08/19/2017) for with NP.    Suanne MarkerVIKRAM R. PENUMALLI, MD 05/21/2017, 8:38 AM Certified in Neurology, Neurophysiology and Neuroimaging  [facility] [address], [address] [place], [place] [address] [phone]

## 2017-07-29 ENCOUNTER — Ambulatory Visit: Payer: Medicaid Other | Admitting: Diagnostic Neuroimaging

## 2017-08-16 ENCOUNTER — Encounter (HOSPITAL_COMMUNITY): Payer: Self-pay | Admitting: *Deleted

## 2017-08-16 ENCOUNTER — Emergency Department (HOSPITAL_COMMUNITY)
Admission: EM | Admit: 2017-08-16 | Discharge: 2017-08-16 | Disposition: A | Discharge status: Home / Self Care | Payer: 59 | Attending: Emergency Medicine | Admitting: Emergency Medicine

## 2017-08-16 ENCOUNTER — Other Ambulatory Visit: Payer: Self-pay

## 2017-08-16 DIAGNOSIS — R42 Dizziness and giddiness: Secondary | ICD-10-CM | POA: Diagnosis not present

## 2017-08-16 DIAGNOSIS — Z79899 Other long term (current) drug therapy: Secondary | ICD-10-CM | POA: Diagnosis not present

## 2017-08-16 DIAGNOSIS — J45909 Unspecified asthma, uncomplicated: Secondary | ICD-10-CM | POA: Insufficient documentation

## 2017-08-16 DIAGNOSIS — R110 Nausea: Secondary | ICD-10-CM | POA: Diagnosis not present

## 2017-08-16 DIAGNOSIS — H538 Other visual disturbances: Secondary | ICD-10-CM | POA: Diagnosis not present

## 2017-08-16 DIAGNOSIS — G43909 Migraine, unspecified, not intractable, without status migrainosus: Secondary | ICD-10-CM | POA: Diagnosis not present

## 2017-08-16 LAB — I-STAT BETA HCG BLOOD, ED (MC, WL, AP ONLY): I-stat hCG, quantitative: <5 m[IU]/mL (ref <5)

## 2017-08-16 LAB — COMPREHENSIVE METABOLIC PANEL
ALK PHOS: 96 U/L (ref 38–126)
ALT: 17 U/L (ref 14–54)
AST: 14 U/L — ABNORMAL LOW (ref 15–41)
Albumin: 3.5 g/dL (ref 3.5–5.0)
Anion gap: 9 (ref 5–15)
BUN: 16 mg/dL (ref 6–20)
CALCIUM: 8.8 mg/dL — AB (ref 8.9–10.3)
CO2: 27 mmol/L (ref 22–32)
CREATININE: 0.67 mg/dL (ref 0.44–1.00)
Chloride: 105 mmol/L (ref 101–111)
GFR calc Af Amer: >60 mL/min (ref >60)
Glucose, Bld: 91 mg/dL (ref 65–99)
Potassium: 4.2 mmol/L (ref 3.5–5.1)
Sodium: 141 mmol/L (ref 135–145)
TOTAL PROTEIN: 7 g/dL (ref 6.5–8.1)

## 2017-08-16 LAB — CBC
HCT: 39.2 % (ref 36.0–46.0)
Hemoglobin: 12.3 g/dL (ref 12.0–15.0)
MCH: 28.7 pg (ref 26.0–34.0)
MCHC: 31.4 g/dL (ref 30.0–36.0)
MCV: 91.4 fL (ref 78.0–100.0)
PLATELETS: 268 10*3/uL (ref 150–400)
RBC: 4.29 MIL/uL (ref 3.87–5.11)
RDW: 13.9 % (ref 11.5–15.5)
WBC: 4.6 10*3/uL (ref 4.0–10.5)

## 2017-08-16 LAB — CBG MONITORING, ED: Glucose-Capillary: 84 mg/dL (ref 65–99)

## 2017-08-16 MED ORDER — MECLIZINE HCL 25 MG PO TABS: 25.0000 mg | ORAL_TABLET | Freq: Three times a day (TID) | ORAL | 0 refills | Status: DC | PRN

## 2017-08-16 MED ORDER — SODIUM CHLORIDE 0.9 % IV BOLUS (SEPSIS)
1000.0000 mL | Freq: Once | INTRAVENOUS | Status: AC
  Administered 2017-08-16: 1000 mL via INTRAVENOUS

## 2017-08-16 MED ORDER — KETOROLAC TROMETHAMINE 15 MG/ML IJ SOLN
15.0000 mg | Freq: Once | INTRAMUSCULAR | Status: AC
  Administered 2017-08-16: 15 mg via INTRAVENOUS
  Filled 2017-08-16: qty 1

## 2017-08-16 MED ORDER — METOCLOPRAMIDE HCL 5 MG/ML IJ SOLN
10.0000 mg | Freq: Once | INTRAMUSCULAR | Status: AC
  Administered 2017-08-16: 10 mg via INTRAVENOUS
  Filled 2017-08-16: qty 2

## 2017-08-16 MED ORDER — MECLIZINE HCL 25 MG PO TABS
50.0000 mg | ORAL_TABLET | Freq: Once | ORAL | Status: AC
  Administered 2017-08-16: 50 mg via ORAL
  Filled 2017-08-16: qty 2

## 2017-08-16 NOTE — ED Provider Notes (Addendum)
[facility]-EMERGENCY DEPT Provider Note   CSN: [id number] Arrival date & time: 08/16/17  0844     History   Chief Complaint Chief Complaint  Patient presents with  . Nicole Brooks  . Near Syncope    Nicole Brooks [name] is a 32 y.o. female.  Nicole Brooks  32 year old female with history of trigeminal neuralgia, migraines comes in with chief complaint of headaches and Nicole Brooks. Patient states that she woke up this morning with a headache that is frontal, sharp and throbbing.  The headache is more severe than her usual migraine headache.  Patient is having associated nausea, Nicole Brooks and bilateral blurred vision.  Nicole Brooks is described as spinning sensation, which is typically present when patient is upright.  Patient denies any recent diarrhea or vomiting.  Patient does not get any Nicole Brooks or vision changes with her migraine typically.  Patient denies any associated numbness, tingling, focal weakness, slurred speech, altered mental status.  There is no family history of brain aneurysm, bleed or tumors.   Past Medical History:  Diagnosis Date  . Asthma   . Cardiomegaly   . Trigeminal neuralgia    right    Patient Active Problem List   Diagnosis Date Noted  . Encounter for sterilization   . Normal labor 04/04/2017  . S/P cesarean section 02/22/2015  . Obesity complicating pregnancy in second trimester   . History of cardiomegaly 08/29/2014  . Migraine 05/12/2012    Past Surgical History:  Procedure Laterality Date  . CESAREAN SECTION N/A 02/19/2015   Procedure: CESAREAN SECTION;  Surgeon: Levie HeritageJacob J Stinson, DO;  Location: [facility];  Service: Obstetrics;  Laterality: N/A;  . DILATION AND EVACUATION  05/17/2012   Procedure: DILATATION AND EVACUATION;  Surgeon: Willodean Rosenthalarolyn Harraway-Smith, MD;  Location: [facility];  Service: Gynecology;  Laterality: N/A;  . DILATION AND EVACUATION  05/17/2012  . TOOTH EXTRACTION    . TUBAL LIGATION Bilateral 04/05/2017   Procedure: POST  PARTUM TUBAL LIGATION;  Surgeon: Reva BoresPratt, Tanya S, MD;  [facility];  Service: Gynecology;  Laterality: Bilateral;    OB History    Gravida Para Term Preterm AB Living   3 2 2  0 1 2   SAB TAB Ectopic Multiple Live Births   1 0 0 0 2       Home Medications    Prior to Admission medications   Medication Sig Start Date End Date Taking? Authorizing Provider  carbamazepine (CARBATROL) 300 MG 12 hr capsule Take 2 capsules (600 mg total) by mouth 2 (two) times daily. 05/21/17   Penumalli, Glenford BayleyVikram R, MD  indomethacin (INDOCIN) 50 MG capsule Take 1 capsule (50 mg total) by mouth 2 (two) times daily with a meal. 05/21/17   Penumalli, Glenford BayleyVikram R, MD  meclizine (ANTIVERT) 25 MG tablet Take 1 tablet (25 mg total) by mouth 3 (three) times daily as needed for Nicole Brooks. 08/16/17   Derwood KaplanNanavati, Theresa Dohrman, MD  senna-docusate (SENOKOT-S) 8.6-50 MG tablet Take 2 tablets daily by mouth. 04/07/17   Arlyce HarmanLockamy, Timothy, DO    Family History Family History  Problem Relation Age of Onset  . Diabetes Mother   . Hypertension Mother     Social History Social History   Tobacco Use  . Smoking status: Never Smoker  . Smokeless tobacco: Never Used  Substance Use Topics  . Alcohol use: No  . Drug use: No     Allergies   Patient has no known allergies.   Review of Systems Review of Systems  Constitutional: Positive for activity change.  Eyes: Positive for visual disturbance. Negative for photophobia.  Skin: Negative for rash.  Allergic/Immunologic: Negative for immunocompromised state.  Neurological: Positive for headaches. Negative for Nicole Brooks, facial asymmetry, speech difficulty, weakness and numbness.     Physical Exam Updated Vital Signs BP 118/73   Pulse 72   Temp 98.3 F (36.8 C) (Oral)   Resp 19   Ht 5' (1.524 m)   Wt 72.6 kg (160 lb)   SpO2 100%   BMI 31.25 kg/m   Physical Exam  Constitutional: She is oriented to person, place, and time. She appears well-developed.    HENT:  Head: Normocephalic and atraumatic.  Eyes: Conjunctivae and EOM are normal. Pupils are equal, round, and reactive to light.  Extraocular muscles are intact, no diplopia  Neck: Normal range of motion. Neck supple.  Cardiovascular: Normal rate.  Pulmonary/Chest: Effort normal.  Abdominal: Bowel sounds are normal.  Neurological: She is alert and oriented to person, place, and time. No cranial nerve deficit. Coordination normal.  Cerebellar exam is normal (finger to nose) Sensory exam normal for bilateral upper and lower extremities - and patient is able to discriminate between sharp and dull. Motor exam is 4+/5   Skin: Skin is warm and dry.  Nursing note and vitals reviewed.    ED Treatments / Results  Labs (all labs ordered are listed, but only abnormal results are displayed) Labs Reviewed  COMPREHENSIVE METABOLIC PANEL - Abnormal; Notable for the following components:      Result Value   Calcium 8.8 (*)    AST 14 (*)    Total Bilirubin <0.1 (*)    All other components within normal limits  I-STAT CHEM 8, ED - Abnormal; Notable for the following components:   Potassium 7.2 (*)    BUN 22 (*)    Calcium, Ion 1.03 (*)    All other components within normal limits  CBC  CBG MONITORING, ED  I-STAT BETA HCG BLOOD, ED ([facility], [facility], [facility] ONLY)    EKG  EKG Interpretation  Date/Time:  Sunday August 16 2017 08:50:25 EDT Ventricular Rate:  79 PR Interval:    QRS Duration: 91 QT Interval:  369 QTC Calculation: 423 R Axis:   75 Text Interpretation:  Sinus rhythm No acute changes normal axis and intervals Confirmed by Derwood Kaplan ([id number]) on 08/16/2017 10:15:02 AM       Radiology No results found.  Procedures Procedures (including critical care time)  Medications Ordered in ED Medications  metoCLOPramide (REGLAN) injection 10 mg (10 mg Intravenous Given 08/16/17 1028)  ketorolac (TORADOL) 15 MG/ML injection 15 mg (15 mg Intravenous Given 08/16/17 1029)  sodium chloride  0.9 % bolus 1,000 mL (0 mLs Intravenous Stopped 08/16/17 1128)  meclizine (ANTIVERT) tablet 50 mg (50 mg Oral Given 08/16/17 1252)  metoCLOPramide (REGLAN) injection 10 mg (10 mg Intravenous Given 08/16/17 1252)     Initial Impression / Assessment and Plan / ED Course  I have reviewed the triage vital signs and the nursing notes.  Pertinent labs & imaging results that were available during my care of the patient were reviewed by me and considered in my medical decision making (see chart for details).  Clinical Course as of Aug 17 1254  Wynelle Link Aug 16, 2017  1247 Upon reassessment, patient reports that her headache is better.  Labs are reassuring.  Patient is not headache free at this time.  I ambulated the patient, and she stated that her Nicole Brooks got worse when  she got up.  Patient still does not have any focal neurologic deficits, is particularly she has negative finger to nose.  Patient also states that her symptoms are worse when she starts moving.  Given that patient does not have any risk factors for stroke, she is young and healthy, and there is no family history of strokes or brain aneurysm -we will treat this conservatively.  Patient is to see neurologist on coming Friday.  We will start patient on Antivert and recommend Epley maneuver.  Strict ER return precautions have been discussed.  Patient will return to the ER if she starts having any focal neurologic deficits or if her Nicole Brooks gets worse at rest.  [AN]    Clinical Course User Index [AN] Derwood Kaplan, MD    DDX includes: Primary headaches - including migrainous headaches, cluster headaches, tension headaches. ICH Carotid dissection / vertebral dissection Cavernous sinus thrombosis Meningitis Encephalitis Sinusitis Tumor Vascular headaches AV malformation Brain aneurysm Muscular headaches  A/P: Pt comes in with cc of headaches. Patient has history of migraines.  Patient has associated nausea, vertigo, vision  changes -and she states that typically she does not get any vision changes or Nicole Brooks with her migraines.  On exam patient has no objective focal neurologic deficits.  Patient is nontoxic-appearing.  She has no meningismus.  Patient does not carry risk factor for thrombosis or dissection and she denies any substance abuse.  Suspicion is still high that she is having complex migraine and/or peripheral vertigo.  We will give some IV fluids and reassess.   Final Clinical Impressions(s) / ED Diagnoses   Final diagnoses:  Hemicrania  Vertigo    ED Discharge Orders        Ordered    meclizine (ANTIVERT) 25 MG tablet  3 times daily PRN     08/16/17 1251       Derwood Kaplan, MD 08/16/17 1126    Derwood Kaplan, MD 08/16/17 1256

## 2017-08-16 NOTE — Discharge Instructions (Addendum)
We saw you in the ER for headaches. All the labs and imaging are normal. We are not sure what is causing your headaches, however, there appears to be no evidence of infection, bleeds or tumors based on our exam and results.  Please take motrin round the clock for the next 6 hours, and take other meds prescribed only for break through pain. Please take the Antivert for your dizziness. Do not drive until your symptoms resolve.  Please return to the ER if the headache gets severe and in not improving, you have associated new one sided numbness, tingling, weakness or confusion, seizures, poor balance or poor vision. Also return to the ER if there is constant dizziness - even at rest, that is getting worse.

## 2017-08-16 NOTE — ED Notes (Signed)
Chem 8 given to MD and RN 

## 2017-08-16 NOTE — ED Notes (Signed)
Bed: WA16 Expected date:  Expected time:  Means of arrival:  Comments: Resc A 

## 2017-08-16 NOTE — ED Triage Notes (Signed)
Pt states she had a headache this morning prior to arriving to work, ate a muffin for breakfast, started to feel dizzy like she was going to pass out. Pt arrives in ED alert and oriented but still feeling dizzy.

## 2017-08-17 LAB — I-STAT CHEM 8, ED
BUN: 22 mg/dL — AB (ref 6–20)
CHLORIDE: 107 mmol/L (ref 101–111)
CREATININE: 0.7 mg/dL (ref 0.44–1.00)
Calcium, Ion: 1.03 mmol/L — ABNORMAL LOW (ref 1.15–1.40)
GLUCOSE: 87 mg/dL (ref 65–99)
HCT: 40 % (ref 36.0–46.0)
HEMOGLOBIN: 13.6 g/dL (ref 12.0–15.0)
POTASSIUM: 7.2 mmol/L — AB (ref 3.5–5.1)
Sodium: 140 mmol/L (ref 135–145)
TCO2: 28 mmol/L (ref 22–32)

## 2017-08-18 NOTE — Progress Notes (Signed)
[facility]  PATIENT: Nicole DadeMia L Kama DOB: 1985/12/25   REASON FOR VISIT: Follow-up for right trigeminal neuralgia HISTORY FROM: Patient    HISTORY OF PRESENT ILLNESS:UPDATE 3/22/2019CM Ms. Elige RadonBradley, 32 year old female returns for follow-up with history of right trigeminal neuralgia.  She continues to have daily sharp attacks in the face on the right.  Brushing her teeth, drinking cold fluids or being exposed to the cold air causes sharp attacks.  She tries to chew on the left side.  600 mg twice daily.  After her last visit she took Indocin for 1 month but did not notice much difference.  She was seen in the emergency room last week for dizziness and headache.  She was found to be dehydrated and given IV fluids.  She has not had further dizziness or headache.  She returns for reevaluation    UPDATE (05/21/17, VRP): Since last visit, doing about the same. Now on CBZ 400mg  twice a day x 1 week, without benefit. Daily sharp attacks in right face. No alleviating factors. Biting hard food, brushing teeth, drinking cold liquids aggravates pain. Also has pain without any provoking factors.   UPDATE (03/27/17, VRP): Since last visit, was doing better, but now pain returned. Taking tylenol without relief. C-section scheduled for 04/17/17 (now [redacted] weeks EGA). Cold and hard food still are aggravating factors.   PRIOR HPI (12/08/16, VRP): 32 year old female with right facial pain. Patient is currently [redacted] weeks pregnant.  In April 2018 patient had onset of pain in her right lower jaw region. Her dentist found tooth #30 was tender to percussion. Therefore she had extraction of tooth #30 on 10/17/16. However symptoms continued. Patient had tooth #29 removed after 1 week. However symptoms continued to persist. Patient feels pain in her right ear, right jaw, tenderness to touch in her right face. Cold drinks and hard foods aggravate the pain. Patient had similar problems in 2016 during her  last pregnancy. Symptoms resolved spontaneously at that time.  No problems with left face. No problems with arms or legs. No other specific triggering or aggravating factors.  [facility]  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    REVIEW OF SYSTEMS: Full 14 system review of systems performed and notable only for those listed, all others are neg:  Constitutional: neg  Cardiovascular: neg Ear/Nose/Throat: neg  Skin: neg Eyes: neg Respiratory: neg Gastroitestinal: neg  Hematology/Lymphatic: neg  Endocrine: neg Musculoskeletal:neg Allergy/Immunology: neg Neurological: Right facial pain Psychiatric: neg Sleep : neg   ALLERGIES: No Known Allergies  HOME MEDICATIONS: Outpatient Medications Prior to Visit  Medication Sig Dispense Refill  . Calcium Carbonate-Vit D-Min (CALCIUM 1200 PO) Take 1 tablet by mouth daily.    . carbamazepine (CARBATROL) 300 MG 12 hr capsule Take 2 capsules (600 mg total) by mouth 2 (two) times daily. 120 capsule 12  . indomethacin (INDOCIN) 50 MG capsule Take 1 capsule (50 mg total) by mouth 2 (two) times daily with a meal. 60 capsule 0  . meclizine (ANTIVERT) 25 MG tablet Take 1 tablet (25 mg total) by mouth 3 (three) times daily as needed for dizziness. 30 tablet 0  . senna-docusate (SENOKOT-S) 8.6-50 MG tablet Take 2 tablets daily by mouth. 6 tablet 0   No facility-administered medications prior to visit.     PAST MEDICAL HISTORY: Past Medical History:  Diagnosis Date  . Asthma   . Cardiomegaly   . Trigeminal neuralgia  right    PAST SURGICAL HISTORY: Past Surgical History:  Procedure Laterality Date  . CESAREAN SECTION N/A 02/19/2015   Procedure: CESAREAN SECTION;  Surgeon: Levie Heritage, DO;  Location: [facility];  Service: Obstetrics;  Laterality: N/A;  . DILATION AND EVACUATION  05/17/2012   Procedure: DILATATION AND EVACUATION;  Surgeon:  Willodean Rosenthal, MD;  Location: [facility];  Service: Gynecology;  Laterality: N/A;  . DILATION AND EVACUATION  05/17/2012  . TOOTH EXTRACTION    . TUBAL LIGATION Bilateral 04/05/2017   Procedure: POST PARTUM TUBAL LIGATION;  Surgeon: Reva Bores, MD;  Location: [facility]IRTH[facility] SUIT[facility];  Service: Gynecology;  Laterality: Bilateral;    FAMILY HISTORY: Family History  Problem Relation Age of Onset  . Diabetes Mother   . Hypertension Mother     SOCIAL HISTORY: Social History   Socioeconomic History  . Marital status: Single    Spouse name: Not on file  . Number of children: 1  . Years of education: Not on file  . 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 Financial resource strain: Not on file  . Food insecurity:    Worry: Not on file    Inability: Not on file  . Transportation needs:    Medical: Not on file    Non-medical: Not on file  Tobacco Use  . Smoking status: Never Smoker  . Smokeless tobacco: Never Used  Substance and Sexual Activity  . Alcohol use: No  . Drug use: No  . Sexual activity: Yes    Birth control/protection: None  Lifestyle  . Physical activity:    Days per week: Not on file    Minutes per session: Not on file  . Stress: Not on file  Relationships  . Social connections:    Talks on phone: Not on file    Gets together: Not on file    Attends religious service: Not on file    Active member of club or organization: Not on file    Attends meetings of clubs or organizations: Not on file    Relationship status: Not on file  . Intimate partner violence:    Fear of current or ex partner: Not on file    Emotionally abused: Not on file    Physically abused: Not on file    Forced sexual activity: Not on file  Other Topics Concern  . Not on file  Social History Narrative   Lives home with SO and 2 yr old son.  Works at [employer]   .  Drinks coffee  sometimes.     PHYSICAL EXAM  Vitals:   08/21/17 0734  BP: 120/83  Pulse: 79  Weight: 167 lb (75.8 kg)  Height: 5' (1.524 m)   Body mass index is 32.61 kg/m.  Generalized: Well developed, obese female in no acute distress  Head: normocephalic and atraumatic,. Oropharynx benign  Neck: Supple,  Musculoskeletal: No deformity   Neurological examination   Mentation: Alert oriented to time, place, history taking. Attention span and concentration appropriate. Recent and remote memory intact.  Follows all commands speech and language fluent.   Cranial nerve II-XII: Fundoscopic exam reveals sharp disc margins.Pupils were equal round reactive to light extraocular movements were full, visual field were full on confrontational test. Facial sensation and strength were normal. hearing was intact to finger rubbing bilaterally. Uvula tongue midline. head turning and shoulder shrug were  normal and symmetric.Tongue protrusion into cheek strength was normal. Motor: normal bulk and tone, full strength in the BUE, BLE,  Sensory: normal and symmetric to light touch,  Coordination: finger-nose-finger, heel-to-shin bilaterally, no dysmetria Reflexes: Symmetric upper and lower, plantar responses were flexor bilaterally. Gait and Station: Rising up from seated position without assistance, normal stance,  moderate stride, good arm swing, smooth turning, able to perform tiptoe, and heel walking without difficulty. Tandem gait is steady  DIAGNOSTIC DATA (LABS, IMAGING, TESTING) - I reviewed patient records, labs, notes, testing and imaging myself where available.  Lab Results  Component Value Date   WBC 4.6 08/16/2017   HGB 13.6 08/16/2017   HCT 40.0 08/16/2017   MCV 91.4 08/16/2017   PLT 268 08/16/2017      Component Value Date/Time   NA 141 08/16/2017 1023   K 4.2 08/16/2017 1023   CL 105 08/16/2017 1023   CO2 27 08/16/2017 1023   GLUCOSE 91 08/16/2017 1023   BUN 16 08/16/2017 1023    CREATININE 0.67 08/16/2017 1023   CALCIUM 8.8 (L) 08/16/2017 1023   PROT 7.0 08/16/2017 1023   ALBUMIN 3.5 08/16/2017 1023   AST 14 (L) 08/16/2017 1023   ALT 17 08/16/2017 1023   ALKPHOS 96 08/16/2017 1023   BILITOT <0.1 (L) 08/16/2017 1023   GFRNONAA >60 08/16/2017 1023   GFRAA >60 08/16/2017 1023    ASSESSMENT AND PLAN  32 y.o. year old female  has a past medical history of Asthma, Cardiomegaly, and Trigeminal neuralgia. here to follow-up for right trigeminal neuralgia.  Her pain is no better with the increase of carbamazepine to 600 mg twice a day.  Indocin was not helpful.    PLAN: Continue carbamazepine 600 mg twice a day Add on Neurontin 300 mg at bedtime May consider amitriptyline or baclofen in the future Follow-up in 3 months I spent 25 minutes of face to face time with patient. Greater than 50% of time was spent in counseling and coordination of care with patient.  We discussed trigeminal neuralgia and the most common triggers.  She was given written information as well.  She was made aware of other medications that can be used.  She was also told about gamma knife surgery if medications fail  Nilda Riggs, [facility], [facility], APRN  [facility] [address] [place], [place] [address] [phone]

## 2017-08-21 ENCOUNTER — Encounter: Payer: Self-pay | Admitting: Nurse Practitioner

## 2017-08-21 ENCOUNTER — Ambulatory Visit: Payer: Medicaid Other | Admitting: Nurse Practitioner

## 2017-08-21 DIAGNOSIS — G500 Trigeminal neuralgia: Secondary | ICD-10-CM

## 2017-08-21 MED ORDER — GABAPENTIN 300 MG PO CAPS: 300.0000 mg | ORAL_CAPSULE | Freq: Every day | ORAL | 3 refills | Status: DC

## 2017-08-21 NOTE — Patient Instructions (Addendum)
Continue carbamazepine 600 mg twice a day Add on Neurontin 300 mg at bedtime May consider amitriptyline or baclofen in the future Follow-up in 3 months  Trigeminal Neuralgia Trigeminal neuralgia is a nerve disorder that causes attacks of severe facial pain. The attacks last from a few seconds to several minutes. They can happen for days, weeks, or months and then go away for months or years. Trigeminal neuralgia is also called tic douloureux. What are the causes? This condition is caused by damage to a nerve in the face that is called the trigeminal nerve. An attack can be triggered by:  Talking.  Chewing.  Putting on makeup.  Washing your face.  Shaving your face.  Brushing your teeth.  Touching your face.  What increases the risk? This condition is more likely to develop in:  Women.  People who are 30 years of age or older.  What are the signs or symptoms? The main symptom of this condition is pain in the jaw, lips, eyes, nose, scalp, forehead, and face. The pain may be intense, stabbing, electric, or shock-like. How is this diagnosed? This condition is diagnosed with a physical exam. A CT scan or MRI may be done to rule out other conditions that can cause facial pain. How is this treated? This condition may be treated with:  Avoiding the things that trigger your attacks.  Pain medicine.  Surgery. This may be done in severe cases if other medical treatment does not provide relief.  Follow these instructions at home:  Take over-the-counter and prescription medicines only as told by your health care provider.  If you wish to get pregnant, talk with your health care provider before you start trying to get pregnant.  Avoid the things that trigger your attacks. It may help to: ? Chew on the unaffected side of your mouth. ? Avoid touching your face. ? Avoid blasts of hot or cold air. Contact a health care provider if:  Your pain medicine is not helping.  You  develop new, unexplained symptoms, such as: ? Double vision. ? Facial weakness. ? Changes in hearing or balance.  You become pregnant. Get help right away if:  Your pain is unbearable, and your pain medicine does not help. This information is not intended to replace advice given to you by your health care provider. Make sure you discuss any questions you have with your health care provider. Document Released: 05/16/2000 Document Revised: 01/20/2016 Document Reviewed: 09/11/2014 Elsevier Interactive Patient Education  [employer]2018 Elsevier Inc.

## 2017-09-28 DIAGNOSIS — J029 Acute pharyngitis, unspecified: Secondary | ICD-10-CM | POA: Diagnosis not present

## 2017-11-09 MED FILL — CARBAMAZEPINE ER 300 MG CAP: 300 | 30 days supply | Qty: 120 | Fill #0

## 2017-11-30 NOTE — Progress Notes (Signed)
[facility]  PATIENT: Nicole Brooks DOB: [birth year]   REASON FOR VISIT: Follow-up for right trigeminal neuralgia HISTORY FROM: Patient     HISTORY OF PRESENT ILLNESS:UPDATE 7/2/2019CM Nicole Brooks, 32 year old female returns for follow-up with history of right trigeminal neuralgia.  She continues to have frequent sharp attacks in the face on the right.  Talking,  brushing her  teeth are triggers .  She is currently on carbamazepine 600 mg twice daily.  Neurontin was added at her last visit however she claims she had side effects of dizziness the next morning after taking the medication.  She returns for reevaluation     UPDATE 3/22/2019CM Nicole Brooks, 32 year old female returns for follow-up with history of right trigeminal neuralgia.  She continues to have daily sharp attacks in the face on the right.  Brushing her teeth, drinking cold fluids or being exposed to the cold air causes sharp attacks.  She tries to chew on the left side.  600 mg twice daily.  After her last visit she took Indocin for 1 month but did not notice much difference.  She was seen in the emergency room last week for dizziness and headache.  She was found to be dehydrated and given IV fluids.  She has not had further dizziness or headache.  She returns for reevaluation    UPDATE (05/21/17, VRP): Since last visit, doing about the same. Now on CBZ 400mg  twice a day x 1 week, without benefit. Daily sharp attacks in right face. No alleviating factors. Biting hard food, brushing teeth, drinking cold liquids aggravates pain. Also has pain without any provoking factors.   UPDATE (03/27/17, VRP): Since last visit, was doing better, but now pain returned. Taking tylenol without relief. C-section scheduled for 04/17/17 (now [redacted] weeks EGA). Cold and hard food still are aggravating factors.   PRIOR HPI (12/08/16, VRP): 32 year old female with right facial pain. Patient is currently [redacted] weeks pregnant.  In  April 2018 patient had onset of pain in her right lower jaw region. Her dentist found tooth #30 was tender to percussion. Therefore she had extraction of tooth #30 on 10/17/16. However symptoms continued. Patient had tooth #29 removed after 1 week. However symptoms continued to persist. Patient feels pain in her right ear, right jaw, tenderness to touch in her right face. Cold drinks and hard foods aggravate the pain. Patient had similar problems in 2016 during her last pregnancy. Symptoms resolved spontaneously at that time.  No problems with left face. No problems with arms or legs. No other specific triggering or aggravating factors.  Patient getting her [facility].  Patient works in Public affairs consultant at [employer].    REVIEW OF SYSTEMS: Full 14 system review of systems performed and notable only for those listed, all others are neg:  Constitutional: neg  Cardiovascular: neg Ear/Nose/Throat: neg  Skin: neg Eyes: neg Respiratory: neg Gastroitestinal: neg  Hematology/Lymphatic: neg  Endocrine: neg Musculoskeletal:neg Allergy/Immunology: neg Neurological: Right facial pain Psychiatric: neg Sleep : neg   ALLERGIES: No Known Allergies  HOME MEDICATIONS: Outpatient Medications Prior to Visit  Medication Sig Dispense Refill  . carbamazepine (CARBATROL) 300 MG 12 hr capsule Take 2 capsules (600 mg total) by mouth 2 (two) times daily. 120 capsule 12  . meclizine (ANTIVERT) 25 MG tablet Take 1 tablet (25 mg total) by mouth 3 (three) times daily as needed for dizziness. 30 tablet 0  . gabapentin (NEURONTIN) 300 MG capsule Take  1 capsule (300 mg total) by mouth at bedtime. (Patient not taking: Reported on 12/01/2017) 30 capsule 3  . Calcium Carbonate-Vit D-Min (CALCIUM 1200 PO) Take 1 tablet by mouth daily.    Marland Kitchen senna-docusate (SENOKOT-S) 8.6-50 MG tablet Take 2 tablets daily by mouth. 6 tablet 0   No facility-administered  medications prior to visit.     PAST MEDICAL HISTORY: Past Medical History:  Diagnosis Date  . Asthma   . Cardiomegaly   . Trigeminal neuralgia    right    PAST SURGICAL HISTORY: Past Surgical History:  Procedure Laterality Date  . CESAREAN SECTION N/A 02/19/2015   Procedure: CESAREAN SECTION;  Surgeon: Levie Heritage, DO;  Location: [facility];  Service: Obstetrics;  Laterality: N/A;  . DILATION AND EVACUATION  05/17/2012   Procedure: DILATATION AND EVACUATION;  Surgeon: Willodean Rosenthal, MD;  Location: [facility];  Service: Gynecology;  Laterality: N/A;  . DILATION AND EVACUATION  05/17/2012  . TOOTH EXTRACTION    . TUBAL LIGATION Bilateral 04/05/2017   Procedure: POST PARTUM TUBAL LIGATION;  Surgeon: Reva Bores, MD;  Location: [facility]IRTHING SUIT[facility];  Service: Gynecology;  Laterality: Bilateral;    FAMILY HISTORY: Family History  Problem Relation Age of Onset  . Diabetes Mother   . Hypertension Mother     SOCIAL HISTORY: Social History   Socioeconomic History  . Marital status: Single    Spouse name: Not on file  . Number of children: 1  . Years of education: Not on file  . 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 Financial resource strain: Not on file  . Food insecurity:    Worry: Not on file    Inability: Not on file  . Transportation needs:    Medical: Not on file    Non-medical: Not on file  Tobacco Use  . Smoking status: Never Smoker  . Smokeless tobacco: Never Used  Substance and Sexual Activity  . Alcohol use: No  . Drug use: No  . Sexual activity: Yes    Birth control/protection: None  Lifestyle  . Physical activity:    Days per week: Not on file    Minutes per session: Not on file  . Stress: Not on file  Relationships  . Social connections:    Talks on phone: Not on file    Gets together: Not on file    Attends religious service: Not on file    Active member of  club or organization: Not on file    Attends meetings of clubs or organizations: Not on file    Relationship status: Not on file  . Intimate partner violence:    Fear of current or ex partner: Not on file    Emotionally abused: Not on file    Physically abused: Not on file    Forced sexual activity: Not on file  Other Topics Concern  . Not on file  Social History Narrative   Lives home with SO and 2 yr old son.  Works at [employer]   .  Drinks coffee sometimes.     PHYSICAL EXAM  Vitals:   12/01/17 0726  BP: 122/89  Pulse: 70  Weight: 168 lb 9.6 oz (76.5 kg)  Height: 5' (1.524 m)   Body mass index is 32.93 kg/m.  Generalized: Well developed, obese female in no acute distress  Head: normocephalic and atraumatic,. Oropharynx benign  Neck: Supple,  Musculoskeletal: No deformity   Neurological examination   Mentation: Alert oriented to time, place, history taking. Attention span and concentration appropriate. Recent and remote memory intact.  Follows all commands speech and language fluent.   Cranial nerve II-XII: Pupils were equal round reactive to light extraocular movements were full, visual field were full on confrontational test. Facial sensation and strength were normal. hearing was intact to finger rubbing bilaterally. Uvula tongue midline. head turning and shoulder shrug were normal and symmetric.Tongue protrusion into cheek strength was normal. Motor: normal bulk and tone, full strength in the BUE, BLE,  Sensory: normal and symmetric to light touch,  Coordination: finger-nose-finger, heel-to-shin bilaterally, no dysmetria Reflexes: Symmetric upper and lower, plantar responses were flexor bilaterally. Gait and Station: Rising up from seated position without assistance, normal stance,  moderate stride, good arm swing, smooth turning, able to perform tiptoe, and heel walking without difficulty. Tandem gait is steady  DIAGNOSTIC DATA (LABS, IMAGING,  TESTING) - I reviewed patient records, labs, notes, testing and imaging myself where available.  Lab Results  Component Value Date   WBC 4.6 08/16/2017   HGB 13.6 08/16/2017   HCT 40.0 08/16/2017   MCV 91.4 08/16/2017   PLT 268 08/16/2017      Component Value Date/Time   NA 141 08/16/2017 1023   K 4.2 08/16/2017 1023   CL 105 08/16/2017 1023   CO2 27 08/16/2017 1023   GLUCOSE 91 08/16/2017 1023   BUN 16 08/16/2017 1023   CREATININE 0.67 08/16/2017 1023   CALCIUM 8.8 (L) 08/16/2017 1023   PROT 7.0 08/16/2017 1023   ALBUMIN 3.5 08/16/2017 1023   AST 14 (L) 08/16/2017 1023   ALT 17 08/16/2017 1023   ALKPHOS 96 08/16/2017 1023   BILITOT <0.1 (L) 08/16/2017 1023   GFRNONAA >60 08/16/2017 1023   GFRAA >60 08/16/2017 1023    ASSESSMENT AND PLAN  32 y.o. year old female  has a past medical history of Asthma, Cardiomegaly, and Trigeminal neuralgia. here to follow-up for right trigeminal neuralgia.  She continues to have attacks of sharp pain during the day.  She remains on carbamazepine  600 mg twice a day.  Indocin was not helpful.  Neurontin was added but caused dizziness     PLAN: Continue carbamazepine 600 mg twice a day  check blood level  CBZ Add on Amitriptyline 10mg  at night for 1 week then 2 tabs at night follow-up in 3 months May consider  baclofen in the future I spent 20 minutes of face to face time with patient. Greater than 50% of time was spent in counseling and coordination of care with patient.  We discussed trigeminal neuralgia and the most common triggers.   She was made aware of other medications that can be used.  She was also told about gamma knife surgery if medications fail  Nilda RiggsNancy Carolyn Lyden Redner, [facility] CenterGNP, [facility], APRN  [facility] [address], [address] [place], [place] [address] [phone]

## 2017-12-01 ENCOUNTER — Ambulatory Visit (INDEPENDENT_AMBULATORY_CARE_PROVIDER_SITE_OTHER): Payer: 59 | Admitting: Nurse Practitioner

## 2017-12-01 ENCOUNTER — Encounter: Payer: Self-pay | Admitting: Nurse Practitioner

## 2017-12-01 VITALS — BP 122/89 | HR 70 | Ht 60.0 in | Wt 168.6 lb

## 2017-12-01 DIAGNOSIS — G500 Trigeminal neuralgia: Secondary | ICD-10-CM

## 2017-12-01 DIAGNOSIS — Z5181 Encounter for therapeutic drug level monitoring: Secondary | ICD-10-CM

## 2017-12-01 MED ORDER — AMITRIPTYLINE HCL 10 MG PO TABS: 10.0000 mg | ORAL_TABLET | Freq: Every day | ORAL | 3 refills | Status: DC

## 2017-12-01 MED FILL — AMITRIPTYLINE HCL 10 MG TAB: 10 | 30 days supply | Qty: 60 | Fill #0

## 2017-12-01 NOTE — Progress Notes (Signed)
I reviewed note and agree with plan.   Suanne MarkerVIKRAM R. Jamisen Duerson, MD 12/01/2017, 9:31 AM Certified in Neurology, Neurophysiology and Neuroimaging  [facility] [address], [address] [place], [place] [address] [phone]

## 2017-12-01 NOTE — Patient Instructions (Addendum)
Continue carbamazepine 600 mg twice a day will check blood level  CBZ Add on Amitriptyline 10mg  at night for 1 week then 2 tabs at night follow-up in 3 months

## 2017-12-02 ENCOUNTER — Telehealth: Payer: Self-pay | Admitting: *Deleted

## 2017-12-02 LAB — CARBAMAZEPINE LEVEL, TOTAL: Carbamazepine (Tegretol), S: 7.9 ug/mL (ref 4.0–12.0)

## 2017-12-02 NOTE — Telephone Encounter (Signed)
LVM informing patient her carbamazepine lab is good. Advised she continue taking medication as prescribed. Left number for any questions.

## 2017-12-04 MED FILL — CARBAMAZEPINE ER 300 MG CAP: 300 | 30 days supply | Qty: 120 | Fill #1

## 2017-12-29 MED FILL — CARBAMAZEPINE ER 300 MG CAP: 300 | 30 days supply | Qty: 120 | Fill #2

## 2018-01-25 MED FILL — CARBAMAZEPINE ER 300 MG CAP: 300 | 30 days supply | Qty: 120 | Fill #3

## 2018-02-25 DIAGNOSIS — Z131 Encounter for screening for diabetes mellitus: Secondary | ICD-10-CM | POA: Diagnosis not present

## 2018-02-25 DIAGNOSIS — Z0000 Encounter for general adult medical examination without abnormal findings: Secondary | ICD-10-CM | POA: Diagnosis not present

## 2018-02-25 DIAGNOSIS — Z13220 Encounter for screening for lipoid disorders: Secondary | ICD-10-CM | POA: Diagnosis not present

## 2018-02-25 DIAGNOSIS — J069 Acute upper respiratory infection, unspecified: Secondary | ICD-10-CM | POA: Diagnosis not present

## 2018-02-25 DIAGNOSIS — E669 Obesity, unspecified: Secondary | ICD-10-CM | POA: Diagnosis not present

## 2018-02-25 DIAGNOSIS — B353 Tinea pedis: Secondary | ICD-10-CM | POA: Diagnosis not present

## 2018-03-01 MED FILL — CLOTRIMAZOLE-BETAMETHASONE: 1-0.05 | 15 days supply | Qty: 30 | Fill #0

## 2018-03-01 MED FILL — VIT D2 1.25 MG (50,000 UNIT: 1.25 MG | 28 days supply | Qty: 4 | Fill #0

## 2018-03-05 MED FILL — CARBAMAZEPINE ER 300 MG CAP: 300 | 30 days supply | Qty: 120 | Fill #4

## 2018-04-02 NOTE — Progress Notes (Signed)
[facility]  PATIENT: Nicole Brooks DOB: June 08, 1985   REASON FOR VISIT: Follow-up for right trigeminal neuralgia HISTORY FROM: Patient     HISTORY OF PRESENT ILLNESS:UPDATE 11/4/2019CM Nicole Brooks, 32 year old female returns for follow-up with history of trigeminal neuralgia on the right.  She continues to have intermittent attacks on the face.  Talking brushing her teeth are triggers.  She is currently on carbamazepine 600 twice daily.  Level at last visit was 7.9.  Amitriptyline was added but when she got to 20 mg she had dizziness so she stopped it altogether.  She returns for reevaluation.  She has tried to keep her face covered since the weather has turned colder.    UPDATE 7/2/2019CM Nicole Brooks, 32 year old female returns for follow-up with history of right trigeminal neuralgia.  She continues to have frequent sharp attacks in the face on the right.  Talking,  brushing her  teeth are triggers .  She is currently on carbamazepine 600 mg twice daily.  Neurontin was added at her last visit however she claims she had side effects of dizziness the next morning after taking the medication.  She returns for reevaluation     UPDATE 3/22/2019CM Nicole Brooks, 32 year old female returns for follow-up with history of right trigeminal neuralgia.  She continues to have daily sharp attacks in the face on the right.  Brushing her teeth, drinking cold fluids or being exposed to the cold air causes sharp attacks.  She tries to chew on the left side.  600 mg twice daily.  After her last visit she took Indocin for 1 month but did not notice much difference.  She was seen in the emergency room last week for dizziness and headache.  She was found to be dehydrated and given IV fluids.  She has not had further dizziness or headache.  She returns for reevaluation    UPDATE (05/21/17, VRP): Since last visit, doing about the same. Now on CBZ 400mg  twice a day x 1 week, without benefit.  Daily sharp attacks in right face. No alleviating factors. Biting hard food, brushing teeth, drinking cold liquids aggravates pain. Also has pain without any provoking factors.   UPDATE (03/27/17, VRP): Since last visit, was doing better, but now pain returned. Taking tylenol without relief. C-section scheduled for 04/17/17 (now [redacted] weeks EGA). Cold and hard food still are aggravating factors.   PRIOR HPI (12/08/16, VRP): 32 year old female with right facial pain. Patient is currently [redacted] weeks pregnant.  In April 2018 patient had onset of pain in her right lower jaw region. Her dentist found tooth #30 was tender to percussion. Therefore she had extraction of tooth #30 on 10/17/16. However symptoms continued. Patient had tooth #29 removed after 1 week. However symptoms continued to persist. Patient feels pain in her right ear, right jaw, tenderness to touch in her right face. Cold drinks and hard foods aggravate the pain. Patient had similar problems in 2016 during her last pregnancy. Symptoms resolved spontaneously at that time.  No problems with left face. No problems with arms or legs. No other specific triggering or aggravating factors.  Patient getting her OB/GYN care through [facility].  Patient works in Public affairs consultant at [employer].    REVIEW OF SYSTEMS: Full 14 system review of systems performed and notable only for those listed, all others are neg:  Constitutional: neg  Cardiovascular: neg Ear/Nose/Throat: neg  Skin: neg Eyes: neg Respiratory: neg Gastroitestinal: neg  Hematology/Lymphatic: neg  Endocrine: neg Musculoskeletal:neg Allergy/Immunology: neg Neurological: Right facial pain Psychiatric: neg Sleep : neg   ALLERGIES: No Known Allergies  HOME MEDICATIONS: Outpatient Medications Prior to Visit  Medication Sig Dispense Refill  . amitriptyline (ELAVIL) 10 MG tablet Take 1 tablet (10 mg total) by mouth at bedtime. 1 po at  night  For 1 week then increase to 2 at night 60 tablet 3  . carbamazepine (CARBATROL) 300 MG 12 hr capsule Take 2 capsules (600 mg total) by mouth 2 (two) times daily. 120 capsule 12  . meclizine (ANTIVERT) 25 MG tablet Take 1 tablet (25 mg total) by mouth 3 (three) times daily as needed for dizziness. 30 tablet 0   No facility-administered medications prior to visit.     PAST MEDICAL HISTORY: Past Medical History:  Diagnosis Date  . Asthma   . Cardiomegaly   . Trigeminal neuralgia    right    PAST SURGICAL HISTORY: Past Surgical History:  Procedure Laterality Date  . CESAREAN SECTION N/A 02/19/2015   Procedure: CESAREAN SECTION;  Surgeon: Levie Heritage, DO;  Location: [facility];  Service: Obstetrics;  Laterality: N/A;  . DILATION AND EVACUATION  05/17/2012   Procedure: DILATATION AND EVACUATION;  Surgeon: Willodean Rosenthal, MD;  Location: [facility];  Service: Gynecology;  Laterality: N/A;  . DILATION AND EVACUATION  05/17/2012  . TOOTH EXTRACTION    . TUBAL LIGATION Bilateral 04/05/2017   Procedure: POST PARTUM TUBAL LIGATION;  Surgeon: Reva Bores, MD;  Location: [facility] BIRTHING SUITES;  Service: Gynecology;  Laterality: Bilateral;    FAMILY HISTORY: Family History  Problem Relation Age of Onset  . Diabetes Mother   . Hypertension Mother     SOCIAL HISTORY: Social History   Socioeconomic History  . Marital status: Single    Spouse name: Not on file  . Number of children: 1  . Years of education: Not on file  . 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 Financial resource strain: Not on file  . Food insecurity:    Worry: Not on file    Inability: Not on file  . Transportation needs:    Medical: Not on file    Non-medical: Not on file  Tobacco Use  . Smoking status: Never Smoker  . Smokeless tobacco: Never Used  Substance and Sexual Activity  . Alcohol use: No  . Drug use: No  .  Sexual activity: Yes    Birth control/protection: None  Lifestyle  . Physical activity:    Days per week: Not on file    Minutes per session: Not on file  . Stress: Not on file  Relationships  . Social connections:    Talks on phone: Not on file    Gets together: Not on file    Attends religious service: Not on file    Active member of club or organization: Not on file    Attends meetings of clubs or organizations: Not on file    Relationship status: Not on file  . Intimate partner violence:    Fear of current or ex partner: Not on file    Emotionally abused: Not on file    Physically abused: Not on file    Forced sexual activity: Not on file  Other Topics Concern  . Not on file  Social History Narrative   Lives home with SO and 2 yr old son.  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  Drinks coffee sometimes.     PHYSICAL EXAM  Vitals:   04/05/18 0833  BP: 122/82  Pulse: 67  Weight: 174 lb 12.8 oz (79.3 kg)  Height: 4\' 11"  (1.499 m)   Body mass index is 35.31 kg/m.  Generalized: Well developed, obese female in no acute distress  Head: normocephalic and atraumatic,. Oropharynx benign  Neck: Supple,  Musculoskeletal: No deformity   Neurological examination   Mentation: Alert oriented to time, place, history taking. Attention span and concentration appropriate. Recent and remote memory intact.  Follows all commands speech and language fluent.   Cranial nerve II-XII: Pupils were equal round reactive to light extraocular movements were full, visual field were full on confrontational test. Facial sensation and strength were normal. hearing was intact to finger rubbing bilaterally. Uvula tongue midline. head turning and shoulder shrug were normal and symmetric.Tongue protrusion into cheek strength was normal. Motor: normal bulk and tone, full strength in the BUE, BLE,  Sensory: normal and symmetric to light touch,  Coordination: finger-nose-finger, heel-to-shin  bilaterally, no dysmetria Reflexes: Symmetric upper and lower, plantar responses were flexor bilaterally. Gait and Station: Rising up from seated position without assistance, normal stance,  moderate stride, good arm swing, smooth turning, able to perform tiptoe, and heel walking without difficulty. Tandem gait is steady  DIAGNOSTIC DATA (LABS, IMAGING, TESTING) - I reviewed patient records, labs, notes, testing and imaging myself where available.  Lab Results  Component Value Date   WBC 4.6 08/16/2017   HGB 13.6 08/16/2017   HCT 40.0 08/16/2017   MCV 91.4 08/16/2017   PLT 268 08/16/2017      Component Value Date/Time   NA 141 08/16/2017 1023   K 4.2 08/16/2017 1023   CL 105 08/16/2017 1023   CO2 27 08/16/2017 1023   GLUCOSE 91 08/16/2017 1023   BUN 16 08/16/2017 1023   CREATININE 0.67 08/16/2017 1023   CALCIUM 8.8 (L) 08/16/2017 1023   PROT 7.0 08/16/2017 1023   ALBUMIN 3.5 08/16/2017 1023   AST 14 (L) 08/16/2017 1023   ALT 17 08/16/2017 1023   ALKPHOS 96 08/16/2017 1023   BILITOT <0.1 (L) 08/16/2017 1023   GFRNONAA >60 08/16/2017 1023   GFRAA >60 08/16/2017 1023    ASSESSMENT AND PLAN  32 y.o. year old female  has a past medical history of Asthma, Cardiomegaly, and Trigeminal neuralgia. here to follow-up for right trigeminal neuralgia.  She continues to have intermittent attacks of sharp pain during the day.  She remains on carbamazepine  600 mg twice a day.  Indocin was not helpful.  Neurontin was added but caused dizziness .  Amitriptyline at 20 mg caused dizziness.    PLAN: Continue carbamazepine 600 mg twice a day  Restart Amitriptyline 10mg  at night  follow-up in 4 months May consider  baclofen in the future I spent 20 minutes of face to face time with patient. Greater than 50% of time was spent in counseling and coordination of care with patient.  We discussed trigeminal neuralgia and the most common triggers.   She was made aware of other medications that can  be used.  She was also told about gamma knife surgery if medications fail  Nilda Riggs, [facility], [facility], APRN  [facility] [address] [place], [place] [address] [phone]

## 2018-04-05 ENCOUNTER — Ambulatory Visit (INDEPENDENT_AMBULATORY_CARE_PROVIDER_SITE_OTHER): Payer: 59 | Admitting: Nurse Practitioner

## 2018-04-05 ENCOUNTER — Encounter: Payer: Self-pay | Admitting: Nurse Practitioner

## 2018-04-05 VITALS — BP 122/82 | HR 67 | Ht 59.0 in | Wt 174.8 lb

## 2018-04-05 DIAGNOSIS — G500 Trigeminal neuralgia: Secondary | ICD-10-CM | POA: Diagnosis not present

## 2018-04-05 MED ORDER — CARBAMAZEPINE ER 300 MG PO CP12: 600.0000 mg | ORAL_CAPSULE | Freq: Two times a day (BID) | ORAL | 11 refills | Status: DC

## 2018-04-05 MED ORDER — AMITRIPTYLINE HCL 10 MG PO TABS: 10.0000 mg | ORAL_TABLET | Freq: Every day | ORAL | 4 refills | Status: DC

## 2018-04-05 MED FILL — CARBAMAZEPINE ER 300 MG CAP: 300 | 30 days supply | Qty: 120 | Fill #0

## 2018-04-05 MED FILL — AMITRIPTYLINE HCL 10 MG TAB: 10 | 19 days supply | Qty: 30 | Fill #0

## 2018-04-05 NOTE — Patient Instructions (Signed)
Continue carbamazepine 600 mg twice a day  Restart Amitriptyline 10mg  at night  follow-up in 4 months

## 2018-04-06 MED FILL — CLOTRIMAZOLE-BETAMETHASONE: 1-0.05 | 15 days supply | Qty: 30 | Fill #1

## 2018-04-08 IMAGING — CT CT HEAD W/O CM
3 of 4 series · 14 of 47 positions shown, 16 images · non-contrast
Comparison: None.

CLINICAL DATA: Frontal headache for the past week. Near syncopal
episode.

EXAM:
CT HEAD WITHOUT CONTRAST
TECHNIQUE: Contiguous axial images were obtained from the base of the skull
through the vertex without intravenous contrast.

[Series 2: head w/o · axial · non-contrast · 0.45mm/px · z∈[+1439,+1559]mm · 8 of 29 slices shown, 10 images]
[im 3/29  brain]
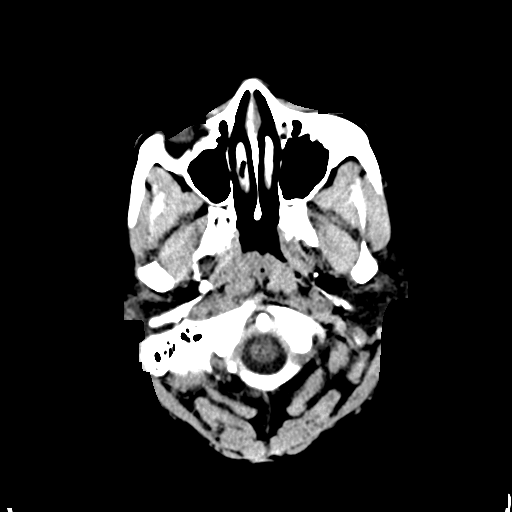
[im 3/29  bone]
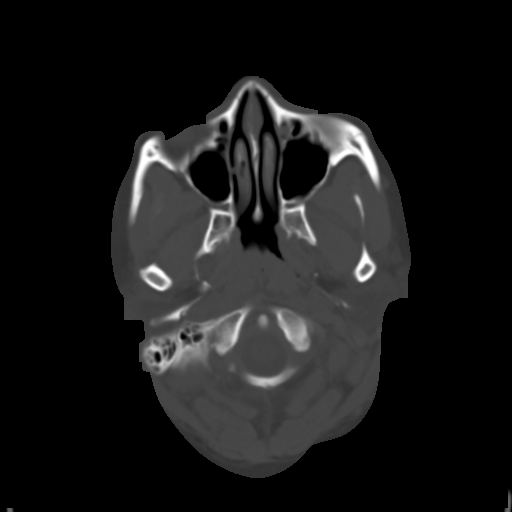
[im 7/29  brain]
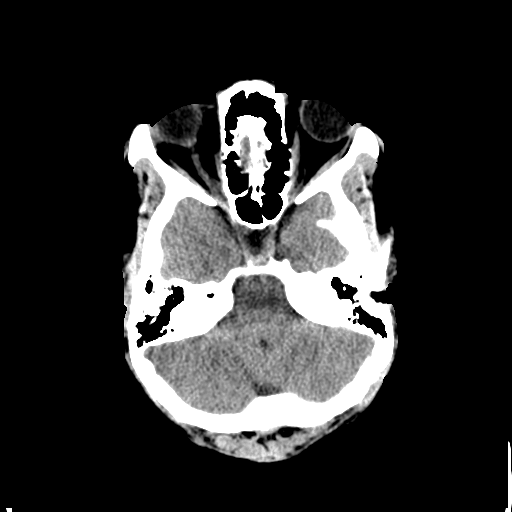
[im 11/29  brain]
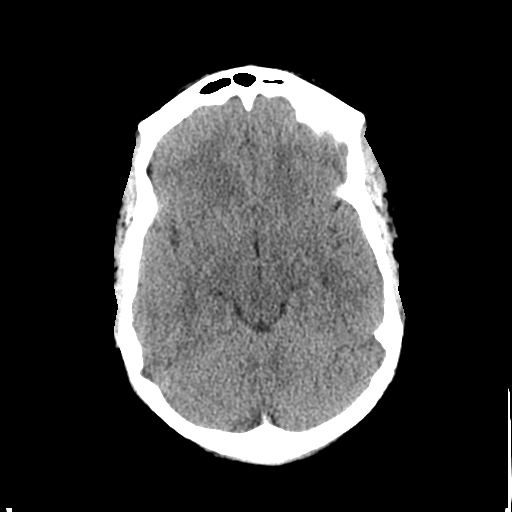
[im 13/29  brain]
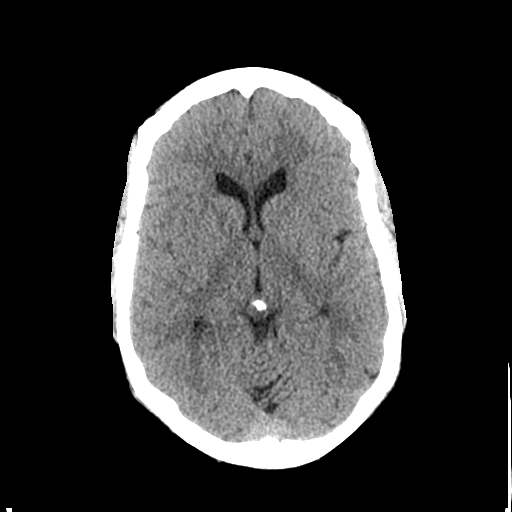
[im 17/29  brain]
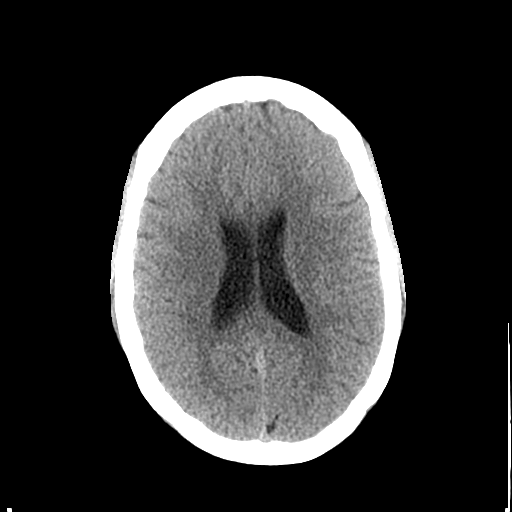
[im 17/29  bone]
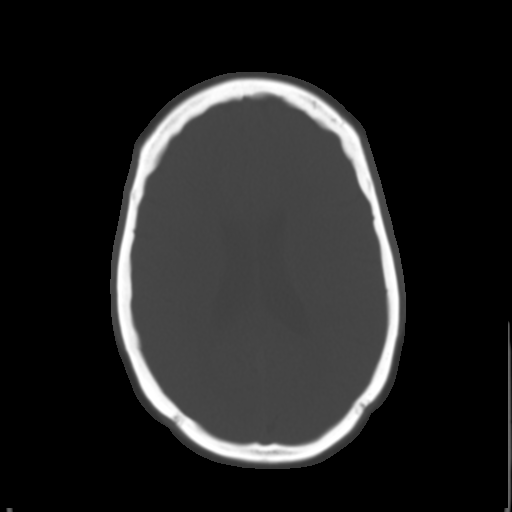
[im 19/29  brain]
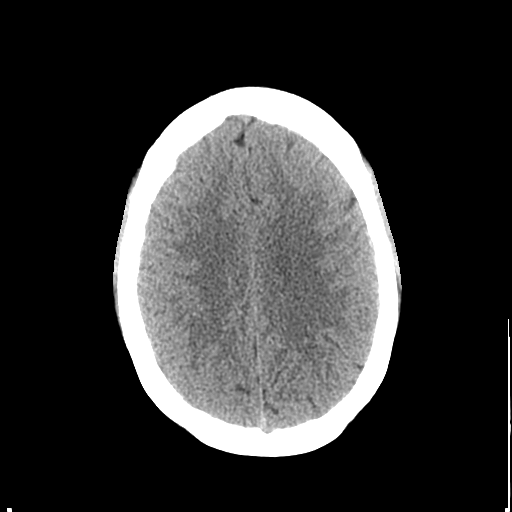
[im 23/29  brain]
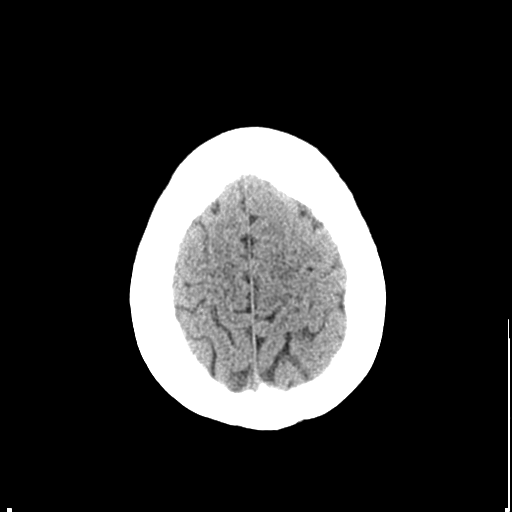
[im 27/29  brain]
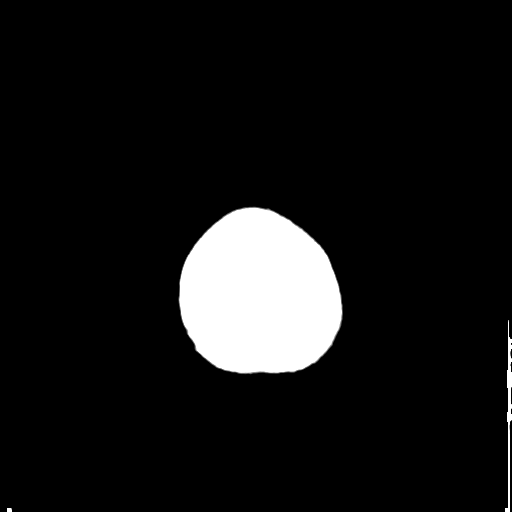

[Series 4: coronal · coronal · 0.28mm/px · 3 of 77 slices shown]
[im 26/77  brain]
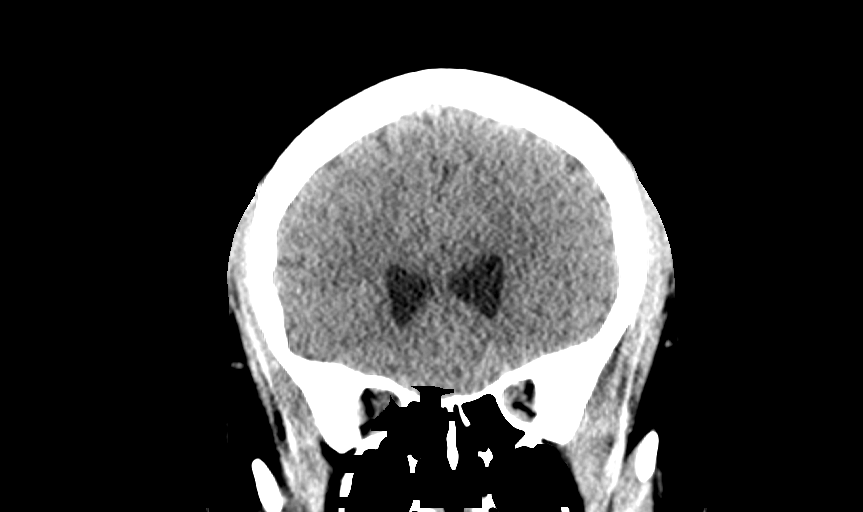
[im 34/77  brain]
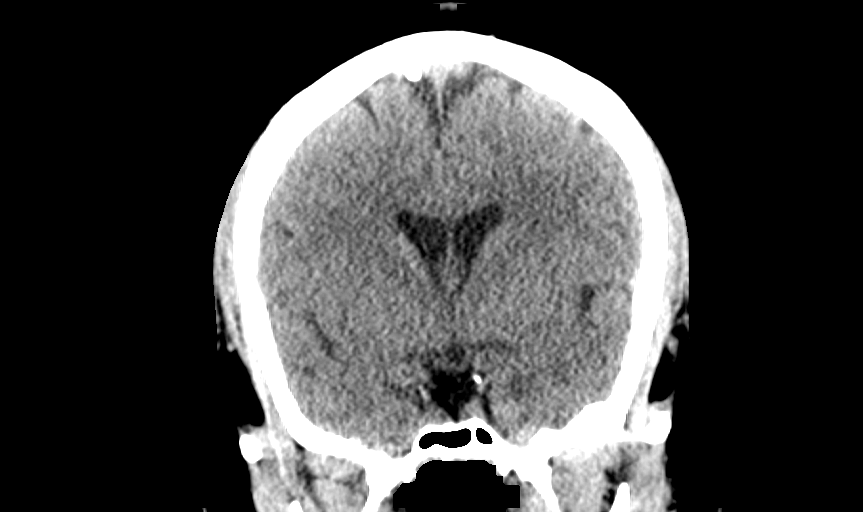
[im 43/77  brain]
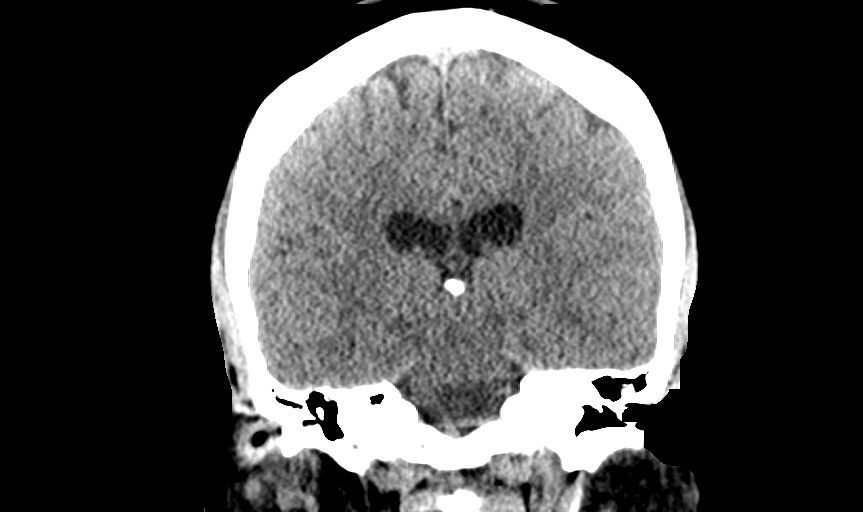

[Series 5: sagittal · sagittal · 0.28mm/px · 3 of 77 slices shown]
[im 26/77  brain]
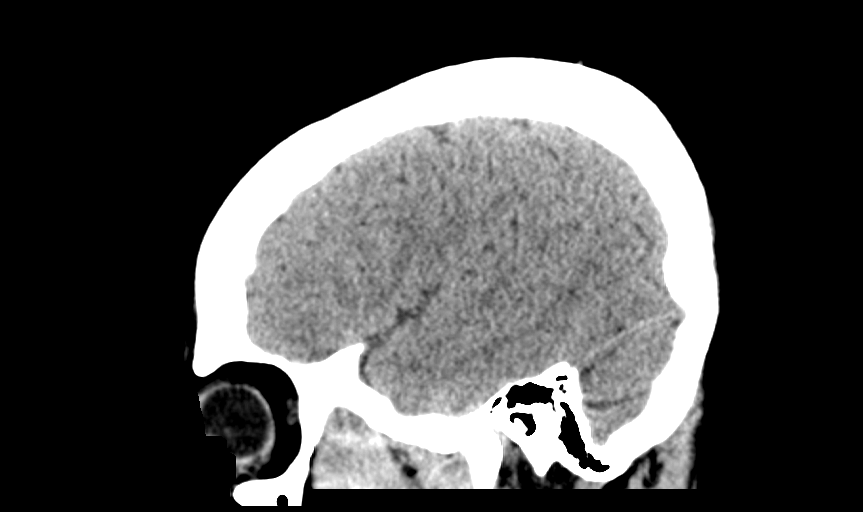
[im 39/77  brain]
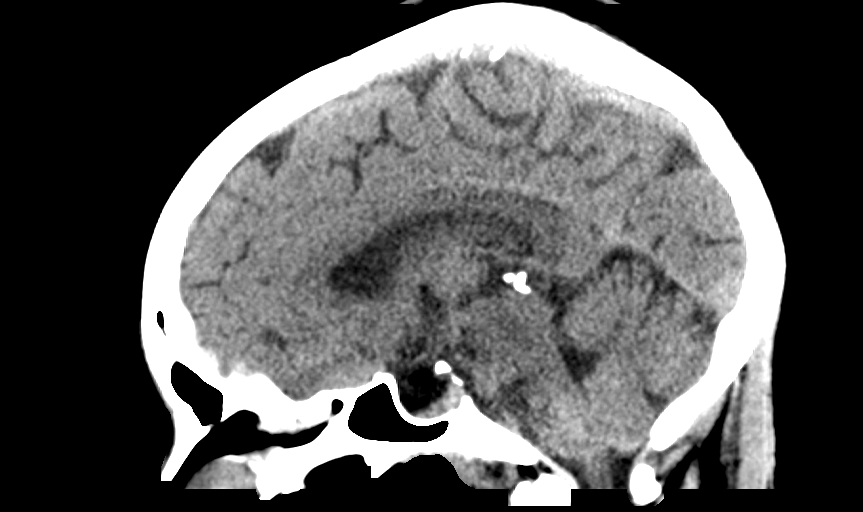
[im 51/77  brain]
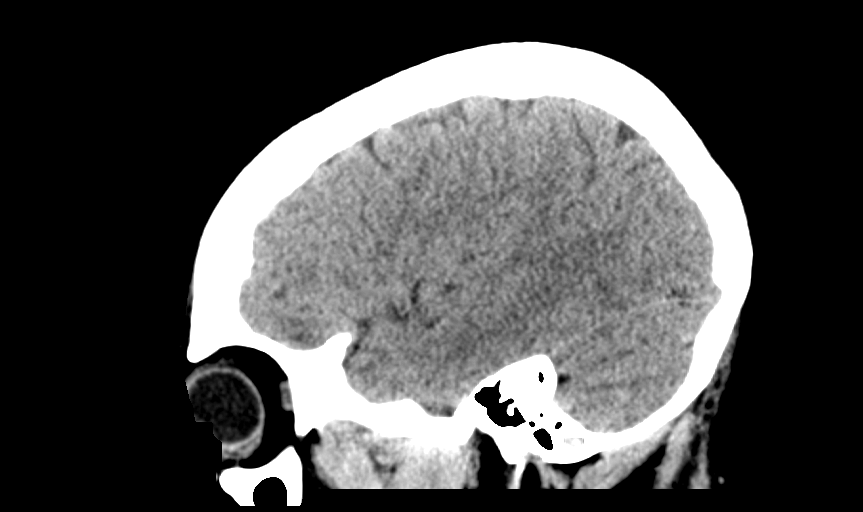

[14 of 47 positions shown; findings below may reference images not displayed]

FINDINGS: Brain: No evidence of acute infarction, hemorrhage, hydrocephalus,
extra-axial collection or mass lesion/mass effect.

Vascular: No hyperdense vessel or unexpected calcification.

Skull: Normal. Negative for fracture or focal lesion.

Sinuses/Orbits: No acute finding.

Other: None.
IMPRESSION: Normal examination.

## 2018-04-20 NOTE — Progress Notes (Signed)
I reviewed note and agree with plan.   VIKRAM R. PENUMALLI, MD  Certified in Neurology, Neurophysiology and Neuroimaging  [facility] [address], [address] [place], [place] [address] [phone]   

## 2018-04-22 DIAGNOSIS — E669 Obesity, unspecified: Secondary | ICD-10-CM | POA: Diagnosis not present

## 2018-04-22 DIAGNOSIS — B353 Tinea pedis: Secondary | ICD-10-CM | POA: Diagnosis not present

## 2018-04-30 MED FILL — CARBAMAZEPINE ER 300 MG CAP: 300 | 30 days supply | Qty: 120 | Fill #1

## 2018-06-01 MED FILL — CARBAMAZEPINE ER 300 MG CAP: 300 | 30 days supply | Qty: 120 | Fill #2

## 2018-06-30 MED FILL — CARBAMAZEPINE ER 300 MG CAP: 300 | 30 days supply | Qty: 120 | Fill #3

## 2018-07-05 NOTE — Progress Notes (Signed)
[facility]  PATIENT: Nicole Brooks DOB: 09-27-1985   REASON FOR VISIT: Follow-up for right trigeminal neuralgia HISTORY FROM: Patient     HISTORY OF PRESENT ILLNESS:UPDATE 2/4/2020CM [name], 33 year old female returns for follow-up with history of trigeminal neuralgia on the right sometimes radiating to the right ear.  She continues to have pretty significant pain.  She is currently on carbamazepine 600 mg twice daily.  Low-dose Elavil was restarted at her last visit but she had side effects of  dizziness.  She has tried Neurontin but had side effects of dizziness.  Indocin was not helpful.  She has not missed any work due to her pain however she says "I just work through it".  She has never had.periods of relief from her pain.  She returns for reevaluation   UPDATE 11/4/2019CM [name], 33 year old female returns for follow-up with history of trigeminal neuralgia on the right.  She continues to have intermittent attacks on the face.  Talking brushing her teeth are triggers.  She is currently on carbamazepine 600 twice daily.  Level at last visit was 7.9.  Amitriptyline was added but when she got to 20 mg she had dizziness so she stopped it altogether.  She returns for reevaluation.  She has tried to keep her face covered since the weather has turned colder.    UPDATE 7/2/2019CM [name], 33 year old female returns for follow-up with history of right trigeminal neuralgia.  She continues to have frequent sharp attacks in the face on the right.  Talking,  brushing her  teeth are triggers .  She is currently on carbamazepine 600 mg twice daily.  Neurontin was added at her last visit however she claims she had side effects of dizziness the next morning after taking the medication.  She returns for reevaluation     UPDATE 3/22/2019CM [name], 33 year old female returns for follow-up with history of right trigeminal neuralgia.  She continues to have daily  sharp attacks in the face on the right.  Brushing her teeth, drinking cold fluids or being exposed to the cold air causes sharp attacks.  She tries to chew on the left side.  600 mg twice daily.  After her last visit she took Indocin for 1 month but did not notice much difference.  She was seen in the emergency room last week for dizziness and headache.  She was found to be dehydrated and given IV fluids.  She has not had further dizziness or headache.  She returns for reevaluation    UPDATE (05/21/17, VRP): Since last visit, doing about the same. Now on CBZ 400mg  twice a day x 1 week, without benefit. Daily sharp attacks in right face. No alleviating factors. Biting hard food, brushing teeth, drinking cold liquids aggravates pain. Also has pain without any provoking factors.   UPDATE (03/27/17, VRP): Since last visit, was doing better, but now pain returned. Taking tylenol without relief. C-section scheduled for 04/17/17 (now [redacted] weeks EGA). Cold and hard food still are aggravating factors.   PRIOR HPI (12/08/16, VRP): 33 year old female with right facial pain. Patient is currently [redacted] weeks pregnant.  In April 2018 patient had onset of pain in her right lower jaw region. Her dentist found tooth #30 was tender to percussion. Therefore she had extraction of tooth #30 on 10/17/16. However symptoms continued. Patient had tooth #29 removed after 1 week. However symptoms continued to persist. Patient feels pain in her right ear, right jaw, tenderness to touch in  her right face. Cold drinks and hard foods aggravate the pain. Patient had similar problems in 2016 during her last pregnancy. Symptoms resolved spontaneously at that time.  No problems with left face. No problems with arms or legs. No other specific triggering or aggravating factors.  Patient getting her OB/GYN care through [facility].  Patient works in Public affairs consultant at [employer].    REVIEW OF  SYSTEMS: Full 14 system review of systems performed and notable only for those listed, all others are neg:  Constitutional: neg  Cardiovascular: neg Ear/Nose/Throat: neg  Skin: neg Eyes: neg Respiratory: neg Gastroitestinal: neg  Hematology/Lymphatic: neg  Endocrine: neg Musculoskeletal:neg Allergy/Immunology: neg Neurological: Right facial pain Psychiatric: neg Sleep : neg   ALLERGIES: No Known Allergies  HOME MEDICATIONS: Outpatient Medications Prior to Visit  Medication Sig Dispense Refill  . amitriptyline (ELAVIL) 10 MG tablet Take 1 tablet (10 mg total) by mouth at bedtime. 1 po at night  For 1 week then increase to 2 at night 30 tablet 4  . carbamazepine (CARBATROL) 300 MG 12 hr capsule Take 2 capsules (600 mg total) by mouth 2 (two) times daily. 120 capsule 11  . meclizine (ANTIVERT) 25 MG tablet Take 1 tablet (25 mg total) by mouth 3 (three) times daily as needed for dizziness. 30 tablet 0   No facility-administered medications prior to visit.     PAST MEDICAL HISTORY: Past Medical History:  Diagnosis Date  . Asthma   . Cardiomegaly   . Trigeminal neuralgia    right    PAST SURGICAL HISTORY: Past Surgical History:  Procedure Laterality Date  . CESAREAN SECTION N/A 02/19/2015   Procedure: CESAREAN SECTION;  Surgeon: Levie Heritage, DO;  Location: [facility];  Service: Obstetrics;  Laterality: N/A;  . DILATION AND EVACUATION  05/17/2012   Procedure: DILATATION AND EVACUATION;  Surgeon: Willodean Rosenthal, MD;  Location: [facility];  Service: Gynecology;  Laterality: N/A;  . DILATION AND EVACUATION  05/17/2012  . TOOTH EXTRACTION    . TUBAL LIGATION Bilateral 04/05/2017   Procedure: POST PARTUM TUBAL LIGATION;  Surgeon: Reva Bores, MD;  Location: [facility] BIRTHING SUIT[facility];  Service: Gynecology;  Laterality: Bilateral;    FAMILY HISTORY: Family History  Problem Relation Age of Onset  . Diabetes Mother   . Hypertension Mother     SOCIAL HISTORY: Social History     Socioeconomic History  . Marital status: Single    Spouse name: Not on file  . Number of children: 2  . Years of education: Not on file  . 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 Financial resource strain: Not on file  . Food insecurity:    Worry: Not on file    Inability: Not on file  . Transportation needs:    Medical: Not on file    Non-medical: Not on file  Tobacco Use  . Smoking status: Never Smoker  . Smokeless tobacco: Never Used  Substance and Sexual Activity  . Alcohol use: No  . Drug use: No  . Sexual activity: Yes    Birth control/protection: None  Lifestyle  . Physical activity:    Days per week: Not on file    Minutes per session: Not on file  . Stress: Not on file  Relationships  . Social connections:    Talks on phone: Not on file    Gets together: Not on file  Attends religious service: Not on file    Active member of club or organization: Not on file    Attends meetings of clubs or organizations: Not on file    Relationship status: Not on file  . Intimate partner violence:    Fear of current or ex partner: Not on file    Emotionally abused: Not on file    Physically abused: Not on file    Forced sexual activity: Not on file  Other Topics Concern  . Not on file  Social History Narrative   Lives home with SO and her 2 children.  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  Drinks coffee sometimes.     PHYSICAL EXAM  Vitals:   07/06/18 0721  BP: 118/80  Pulse: 74  Weight: 173 lb (78.5 kg)  Height: 4\' 11"  (1.499 m)   Body mass index is 34.94 kg/m.  Generalized: Well developed, obese female in no acute distress  Head: normocephalic and atraumatic,. Oropharynx benign  Neck: Supple,  Musculoskeletal: No deformity   Neurological examination   Mentation: Alert oriented to time, place, history taking. Attention span and concentration appropriate. Recent  and remote memory intact.  Follows all commands speech and language fluent.   Cranial nerve II-XII: Pupils were equal round reactive to light extraocular movements were full, visual field were full on confrontational test. Facial sensation and strength were normal. hearing was intact to finger rubbing bilaterally. Uvula tongue midline. head turning and shoulder shrug were normal and symmetric.Tongue protrusion into cheek strength was normal. Motor: normal bulk and tone, full strength in the BUE, BLE,  Sensory: normal and symmetric to light touch,  Coordination: finger-nose-finger, heel-to-shin bilaterally, no dysmetria Reflexes: Symmetric upper and lower, plantar responses were flexor bilaterally. Gait and Station: Rising up from seated position without assistance, normal stance,  moderate stride, good arm swing, smooth turning, able to perform tiptoe, and heel walking without difficulty. Tandem gait is steady  DIAGNOSTIC DATA (LABS, IMAGING, TESTING) - I reviewed patient records, labs, notes, testing and imaging myself where available.  Lab Results  Component Value Date   WBC 4.6 08/16/2017   HGB 13.6 08/16/2017   HCT 40.0 08/16/2017   MCV 91.4 08/16/2017   PLT 268 08/16/2017      Component Value Date/Time   NA 141 08/16/2017 1023   K 4.2 08/16/2017 1023   CL 105 08/16/2017 1023   CO2 27 08/16/2017 1023   GLUCOSE 91 08/16/2017 1023   BUN 16 08/16/2017 1023   CREATININE 0.67 08/16/2017 1023   CALCIUM 8.8 (L) 08/16/2017 1023   PROT 7.0 08/16/2017 1023   ALBUMIN 3.5 08/16/2017 1023   AST 14 (L) 08/16/2017 1023   ALT 17 08/16/2017 1023   ALKPHOS 96 08/16/2017 1023   BILITOT <0.1 (L) 08/16/2017 1023   GFRNONAA >60 08/16/2017 1023   GFRAA >60 08/16/2017 1023    ASSESSMENT AND PLAN  33 y.o. year old female  has a past medical history of Asthma, Cardiomegaly, and Trigeminal neuralgia. here to follow-up for right trigeminal neuralgia.  She continues to have  attacks of sharp pain  daily  She remains on carbamazepine  600 mg twice a day.  Indocin was not helpful.  Neurontin was added but caused dizziness .  Amitriptyline at 10 mg caused dizziness.    PLAN: Continue carbamazepine 600 mg twice a day  Will obtain CBZ level Try Baclofen 10mg  twice daily [facility]  follow-up in 4 months I spent 20 minutes of face to face time with patient. Greater than 50% of time was spent in counseling and coordination of care with patient.  We discussed trigeminal neuralgia and the most common triggers.   She was made aware of other medications that can be used.  She was also told about gamma knife surgery if medications fail  Nilda Riggs, [facility], [facility], APRN  [facility] [address] [place], [place] [address] [phone]

## 2018-07-06 ENCOUNTER — Encounter

## 2018-07-06 ENCOUNTER — Encounter: Payer: Self-pay | Admitting: Nurse Practitioner

## 2018-07-06 ENCOUNTER — Ambulatory Visit (INDEPENDENT_AMBULATORY_CARE_PROVIDER_SITE_OTHER): Payer: 59 | Admitting: Nurse Practitioner

## 2018-07-06 VITALS — BP 118/80 | HR 74 | Ht 59.0 in | Wt 173.0 lb

## 2018-07-06 DIAGNOSIS — Z5181 Encounter for therapeutic drug level monitoring: Secondary | ICD-10-CM

## 2018-07-06 DIAGNOSIS — G500 Trigeminal neuralgia: Secondary | ICD-10-CM | POA: Diagnosis not present

## 2018-07-06 MED ORDER — BACLOFEN 10 MG PO TABS: 10.0000 mg | ORAL_TABLET | Freq: Two times a day (BID) | ORAL | 1 refills | Status: DC

## 2018-07-06 MED FILL — BACLOFEN 10 MG TABS: 10 | 90 days supply | Qty: 180 | Fill #0

## 2018-07-06 NOTE — Progress Notes (Signed)
I reviewed note and agree with plan.   Suanne Marker, MD 07/06/2018, 9:46 AM Certified in Neurology, Neurophysiology and Neuroimaging  [facility] [address], [address] [place], [place] [address] [phone]

## 2018-07-06 NOTE — Patient Instructions (Signed)
Continue carbamazepine 600 mg twice a day  Will obtain CBZ level Try Baclofen 10mg  twice daily Referral to Dr. Angelyn Punt at [facility] for [facility] evaluation  follow-up in 4 months

## 2018-07-07 LAB — CARBAMAZEPINE LEVEL, TOTAL: Carbamazepine (Tegretol), S: 10.2 ug/mL (ref 4.0–12.0)

## 2018-07-08 ENCOUNTER — Telehealth: Payer: Self-pay | Admitting: *Deleted

## 2018-07-08 NOTE — Telephone Encounter (Signed)
-----   Message from Nilda Riggs, NP sent at 07/07/2018 11:00 AM EST ----- Good level of carbamazepine please can continue current dose we will add baclofen as discussed yesterday we have made a referral to [facility] for gamma knife please call the patient

## 2018-07-08 NOTE — Telephone Encounter (Signed)
Spoke to pt and relayed that the carbamaepine level was good.  Continue on current dose of medications.  Pt already picked up baclofen and I relayed that referral to [facility] for gamma knife done,  If not heard by weeks time to call us back to f/u on.  She verbalized understanding.

## 2018-07-28 MED FILL — CARBAMAZEPINE ER 300 MG CAP: 300 | 30 days supply | Qty: 120 | Fill #4

## 2018-08-04 ENCOUNTER — Ambulatory Visit: Payer: 59 | Admitting: Nurse Practitioner

## 2018-08-19 DIAGNOSIS — G500 Trigeminal neuralgia: Secondary | ICD-10-CM | POA: Diagnosis not present

## 2018-08-19 MED FILL — GABAPENTIN 300 MG CAPSULE: 300 | 30 days supply | Qty: 90 | Fill #0

## 2018-08-24 MED FILL — CARBAMAZEPINE ER 300 MG CAP: 300 | 30 days supply | Qty: 120 | Fill #5

## 2018-09-21 MED FILL — CARBAMAZEPINE ER 300 MG CAP: 300 | 30 days supply | Qty: 120 | Fill #6

## 2018-09-30 DIAGNOSIS — G500 Trigeminal neuralgia: Secondary | ICD-10-CM | POA: Diagnosis not present

## 2018-09-30 DIAGNOSIS — Z510 Encounter for antineoplastic radiation therapy: Secondary | ICD-10-CM | POA: Diagnosis not present

## 2018-10-26 ENCOUNTER — Telehealth: Payer: Self-pay

## 2018-10-26 NOTE — Telephone Encounter (Signed)
Spoke with the patient and they have given verbal consent to file their insurance and to do a doxy[employer]me visit. E-mail, mobile number and carrier have been confirmed and sent.  E-mail: [email or web address] Text: [phone] ([employer])

## 2018-11-03 NOTE — Progress Notes (Signed)
PATIENT: Nicole Brooks DOB: 03-03-1986  REASON FOR VISIT: follow up HISTORY FROM: patient  Virtual Visit via Telephone Note  I connected with Nicole Brooks on 11/04/18 at  7:30 AM EDT by telephone and verified that I am speaking with the correct person using two identifiers.   I discussed the limitations, risks, security and privacy concerns of performing an evaluation and management service by telephone and the availability of in person appointments. I also discussed with the patient that there may be a patient responsible charge related to this service. The patient expressed understanding and agreed to proceed.   History of Present Illness:  11/04/18 Nicole Brooks is a 33 y.o. female here today for follow up of trigeminal. [facility].  She reports that on April 30 she had her first gamma knife procedure.  She reports a significant reduction in pain levels.  She is only taking 1 carbamazepine 300 mg tablet per day.  She is very pleased with the results of the gamma knife treatment.  She states that she is scheduled to repeat therapy in August.  She is hoping to discontinue medications at that time.   HISTORY [facility]  UPDATE 2/4/2020CM Nicole Brooks, 33 year old female returns for follow-up with history of trigeminal neuralgia on the right sometimes radiating to the right ear.  She continues to have pretty significant pain.  She is currently on carbamazepine 600 mg twice daily.  Low-dose Elavil was restarted at her last visit but she had side effects of  dizziness.  She has tried Neurontin but had side effects of dizziness.  Indocin was not helpful.  She has not missed any work due to her pain however she says "I just work through it".  She has never had.periods of relief from her pain.  She returns for reevaluation  UPDATE 11/4/2019CM Nicole Brooks, 33 year old female returns for follow-up  with history of trigeminal neuralgia on the right.  She continues to have intermittent attacks on the face.  Talking brushing her teeth are triggers.  She is currently on carbamazepine 600 twice daily.  Level at last visit was 7.9.  Amitriptyline was added but when she got to 20 mg she had dizziness so she stopped it altogether.  She returns for reevaluation.  She has tried to keep her face covered since the weather has turned colder.  UPDATE 7/2/2019CM Nicole Brooks, 33 year old female returns for follow-up with history of right trigeminal neuralgia.  She continues to have frequent sharp attacks in the face on the right.  Talking,  brushing her  teeth are triggers .  She is currently on carbamazepine 600 mg twice daily.  Neurontin was added at her last visit however she claims she had side effects of dizziness the next morning after taking the medication.  She returns for reevaluation  UPDATE 3/22/2019CM Nicole Brooks, 33 year old female returns for follow-up with history of right trigeminal neuralgia.  She continues to have daily sharp attacks in the face on the right.  Brushing her teeth, drinking cold fluids or being exposed to the cold air causes sharp attacks.  She tries to chew on the left side.  600 mg twice daily.  After her last visit she took Indocin for 1 month but did not notice much difference.  She was seen in the emergency room last week for dizziness and headache.  She was found to be dehydrated and given IV  fluids.  She has not had further dizziness or headache.  She returns for reevaluation  UPDATE (05/21/17, VRP): Since last visit, doingabout the same. Now on CBZ 400mg twice a dayx 1 week, without benefit. Daily sharp attacks in right face.No alleviating factors.Biting hard food, brushing teeth, drinking cold liquids aggravates pain. Also has pain without any provoking factors.  UPDATE (03/27/17, VRP): Since last visit, was doing better, but now pain returned. Taking tylenol without  relief. C-section scheduled for 04/17/17 (now [redacted] weeks EGA). Cold and hard food still are aggravating factors.   PRIOR HPI (12/08/16, VRP): 33 year old female with right facial pain. Patient is currently [redacted] weeks pregnant.  In April 2018 patient had onset of pain in her right lower jaw region. Her dentist found tooth #30 was tender to percussion. Therefore she had extraction of tooth #30 on 10/17/16. However symptoms continued. Patient had tooth #29 removed after 1 week. However symptoms continued to persist. Patient feels pain in her right ear, right jaw, tenderness to touch in her right face. Cold drinks and hard foods aggravate the pain. Patient had similar problems in 2016 during her last pregnancy. Symptoms resolved spontaneously at that time.  No problems with left face. No problems with arms or legs. No other specific triggering or aggravating factors.  Patient getting her OB/GYN care through [facility].  Patient works in Public affairs consultant at [employer].   Observations/Objective:  Generalized: Well developed, in no acute distress  Mentation: Alert oriented to time, place, history taking. Follows all commands speech and language fluent   Assessment and Plan:  33 y.o. year old female  has a past medical history of Asthma, Cardiomegaly, and Trigeminal neuralgia. here with    ICD-10-CM   1. Trigeminal neuralgia of right side of face G50.0    Nicole Brooks reports significant reduction in her trigeminal neuralgia pain following gamma knife therapy.  We will continue carbamazepine 300 mg daily.  I have advised follow-up in 6 months.  At this point she will have had her second gamma knife treatment.  Will consider stopping carbamazepine pending response to treatment.  She verbalizes understanding and agreement with this plan.  No orders of the defined types were placed in this encounter.   No orders of the defined types were placed in this  encounter.    Follow Up Instructions:  I discussed the assessment and treatment plan with the patient. The patient was provided an opportunity to ask questions and all were answered. The patient agreed with the plan and demonstrated an understanding of the instructions.   The patient was advised to call back or seek an in-person evaluation if the symptoms worsen or if the condition fails to improve as anticipated.  I provided 20 minutes of non-face-to-face time during this encounter.  Patient is located at her place of residence during video conference.  Provider is located in the office.  Rozell Searing, CMA helped to facilitate visit.   Shawnie Dapper, NP

## 2018-11-04 ENCOUNTER — Ambulatory Visit (INDEPENDENT_AMBULATORY_CARE_PROVIDER_SITE_OTHER): Payer: 59 | Admitting: Family Medicine

## 2018-11-04 ENCOUNTER — Encounter: Payer: Self-pay | Admitting: Family Medicine

## 2018-11-04 ENCOUNTER — Other Ambulatory Visit: Payer: Self-pay

## 2018-11-04 DIAGNOSIS — G500 Trigeminal neuralgia: Secondary | ICD-10-CM | POA: Diagnosis not present

## 2018-11-11 NOTE — Progress Notes (Signed)
I reviewed note and agree with plan.   Bebe Moncure R. Keidan Aumiller, MD 11/11/2018, 7:02 PM Certified in Neurology, Neurophysiology and Neuroimaging  [facility] [address], [address] , [place] [address] [phone]  

## 2018-11-18 MED FILL — CARBAMAZEPINE ER 300 MG CAP: 300 | 30 days supply | Qty: 120 | Fill #7

## 2019-01-25 MED FILL — CARBAMAZEPINE ER 300 MG CAP: 300 | 30 days supply | Qty: 120 | Fill #8

## 2019-02-21 ENCOUNTER — Encounter: Payer: Self-pay | Admitting: Family

## 2019-02-21 ENCOUNTER — Other Ambulatory Visit: Payer: Self-pay

## 2019-02-21 ENCOUNTER — Ambulatory Visit (INDEPENDENT_AMBULATORY_CARE_PROVIDER_SITE_OTHER): Payer: 59 | Admitting: Family

## 2019-02-21 ENCOUNTER — Other Ambulatory Visit: Payer: Self-pay | Admitting: Family

## 2019-02-21 ENCOUNTER — Other Ambulatory Visit (INDEPENDENT_AMBULATORY_CARE_PROVIDER_SITE_OTHER): Payer: 59

## 2019-02-21 VITALS — BP 118/78 | HR 79 | Temp 98.3°F | Ht 59.0 in | Wt 193.1 lb

## 2019-02-21 DIAGNOSIS — E559 Vitamin D deficiency, unspecified: Secondary | ICD-10-CM

## 2019-02-21 DIAGNOSIS — D509 Iron deficiency anemia, unspecified: Secondary | ICD-10-CM

## 2019-02-21 DIAGNOSIS — Z13220 Encounter for screening for lipoid disorders: Secondary | ICD-10-CM

## 2019-02-21 DIAGNOSIS — Z0000 Encounter for general adult medical examination without abnormal findings: Secondary | ICD-10-CM | POA: Diagnosis not present

## 2019-02-21 DIAGNOSIS — Z23 Encounter for immunization: Secondary | ICD-10-CM | POA: Diagnosis not present

## 2019-02-21 LAB — CBC WITH DIFFERENTIAL/PLATELET
Basophils Absolute: 0 10*3/uL (ref 0.0–0.1)
Basophils Relative: 0.3 % (ref 0.0–3.0)
Eosinophils Absolute: 0.1 10*3/uL (ref 0.0–0.7)
Eosinophils Relative: 1.7 % (ref 0.0–5.0)
HCT: 34.8 % — ABNORMAL LOW (ref 36.0–46.0)
Hemoglobin: 11.1 g/dL — ABNORMAL LOW (ref 12.0–15.0)
Lymphocytes Relative: 17.3 % (ref 12.0–46.0)
Lymphs Abs: 0.9 10*3/uL (ref 0.7–4.0)
MCHC: 31.7 g/dL (ref 30.0–36.0)
MCV: 88.9 fl (ref 78.0–100.0)
Monocytes Absolute: 0.5 10*3/uL (ref 0.1–1.0)
Monocytes Relative: 8.2 % (ref 3.0–12.0)
Neutro Abs: 4 10*3/uL (ref 1.4–7.7)
Neutrophils Relative %: 72.5 % (ref 43.0–77.0)
Platelets: 296 10*3/uL (ref 150.0–400.0)
RBC: 3.92 Mil/uL (ref 3.87–5.11)
RDW: 13.3 % (ref 11.5–15.5)
WBC: 5.5 10*3/uL (ref 4.0–10.5)

## 2019-02-21 LAB — LIPID PANEL
Cholesterol: 168 mg/dL (ref 0–200)
HDL: 77 mg/dL (ref >39.00)
LDL Cholesterol: 84 mg/dL (ref 0–99)
NonHDL: 90.77
Total CHOL/HDL Ratio: 2
Triglycerides: 32 mg/dL (ref 0.0–149.0)
VLDL: 6.4 mg/dL (ref 0.0–40.0)

## 2019-02-21 LAB — COMPREHENSIVE METABOLIC PANEL
ALT: 8 U/L (ref 0–35)
AST: 11 U/L (ref 0–37)
Albumin: 3.7 g/dL (ref 3.5–5.2)
Alkaline Phosphatase: 102 U/L (ref 39–117)
BUN: 18 mg/dL (ref 6–23)
CO2: 28 mEq/L (ref 19–32)
Calcium: 9.1 mg/dL (ref 8.4–10.5)
Chloride: 105 mEq/L (ref 96–112)
Creatinine, Ser: 0.57 mg/dL (ref 0.40–1.20)
GFR: 149.84 mL/min (ref >60.00)
Glucose, Bld: 90 mg/dL (ref 70–99)
Potassium: 4 mEq/L (ref 3.5–5.1)
Sodium: 140 mEq/L (ref 135–145)
Total Bilirubin: 0.2 mg/dL (ref 0.2–1.2)
Total Protein: 7 g/dL (ref 6.0–8.3)

## 2019-02-21 LAB — VITAMIN D 25 HYDROXY (VIT D DEFICIENCY, FRACTURES): VITD: 12.22 ng/mL — ABNORMAL LOW (ref 30.00–100.00)

## 2019-02-21 LAB — TSH: TSH: 0.64 u[IU]/mL (ref 0.35–4.50)

## 2019-02-21 NOTE — Patient Instructions (Signed)
Debrox ear wax removal kit for right ear   Health Maintenance, Female Adopting a healthy lifestyle and getting preventive care are important in promoting health and wellness. Ask your health care provider about:  The right schedule for you to have regular tests and exams.  Things you can do on your own to prevent diseases and keep yourself healthy. What should I know about diet, weight, and exercise? Eat a healthy diet   Eat a diet that includes plenty of vegetables, fruits, low-fat dairy products, and lean protein.  Do not eat a lot of foods that are high in solid fats, added sugars, or sodium. Maintain a healthy weight Body mass index (BMI) is used to identify weight problems. It estimates body fat based on height and weight. Your health care provider can help determine your BMI and help you achieve or maintain a healthy weight. Get regular exercise Get regular exercise. This is one of the most important things you can do for your health. Most adults should:  Exercise for at least 150 minutes each week. The exercise should increase your heart rate and make you sweat (moderate-intensity exercise).  Do strengthening exercises at least twice a week. This is in addition to the moderate-intensity exercise.  Spend less time sitting. Even light physical activity can be beneficial. Watch cholesterol and blood lipids Have your blood tested for lipids and cholesterol at 31 years of age, then have this test every 5 years. Have your cholesterol levels checked more often if:  Your lipid or cholesterol levels are high.  You are older than 31 years of age.  You are at high risk for heart disease. What should I know about cancer screening? Depending on your health history and family history, you may need to have cancer screening at various ages. This may include screening for:  Breast cancer.  Cervical cancer.  Colorectal cancer.  Skin cancer.  Lung cancer. What should I know about  heart disease, diabetes, and high blood pressure? Blood pressure and heart disease  High blood pressure causes heart disease and increases the risk of stroke. This is more likely to develop in people who have high blood pressure readings, are of African descent, or are overweight.  Have your blood pressure checked: ? Every 3-5 years if you are 30-65 years of age. ? Every year if you are 31 years old or older. Diabetes Have regular diabetes screenings. This checks your fasting blood sugar level. Have the screening done:  Once every three years after age 31 if you are at a normal weight and have a low risk for diabetes.  More often and at a younger age if you are overweight or have a high risk for diabetes. What should I know about preventing infection? Hepatitis B If you have a higher risk for hepatitis B, you should be screened for this virus. Talk with your health care provider to find out if you are at risk for hepatitis B infection. Hepatitis C Testing is recommended for:  Everyone born from [birth year] through 1963.  Anyone with known risk factors for hepatitis C. Sexually transmitted infections (STIs)  Get screened for STIs, including gonorrhea and chlamydia, if: ? You are sexually active and are younger than 31 years of age. ? You are older than 31 years of age and your health care provider tells you that you are at risk for this type of infection. ? Your sexual activity has changed since you were last screened, and you are at increased risk  for chlamydia or gonorrhea. Ask your health care provider if you are at risk.  Ask your health care provider about whether you are at high risk for HIV. Your health care provider may recommend a prescription medicine to help prevent HIV infection. If you choose to take medicine to prevent HIV, you should first get tested for HIV. You should then be tested every 3 months for as long as you are taking the medicine. Pregnancy  If you are about to  stop having your period (premenopausal) and you may become pregnant, seek counseling before you get pregnant.  Take 400 to 800 micrograms (mcg) of folic acid every day if you become pregnant.  Ask for birth control (contraception) if you want to prevent pregnancy. Osteoporosis and menopause Osteoporosis is a disease in which the bones lose minerals and strength with aging. This can result in bone fractures. If you are 31 years old or older, or if you are at risk for osteoporosis and fractures, ask your health care provider if you should:  Be screened for bone loss.  Take a calcium or vitamin D supplement to lower your risk of fractures.  Be given hormone replacement therapy (HRT) to treat symptoms of menopause. Follow these instructions at home: Lifestyle  Do not use any products that contain nicotine or tobacco, such as cigarettes, e-cigarettes, and chewing tobacco. If you need help quitting, ask your health care provider.  Do not use street drugs.  Do not share needles.  Ask your health care provider for help if you need support or information about quitting drugs. Alcohol use  Do not drink alcohol if: ? Your health care provider tells you not to drink. ? You are pregnant, may be pregnant, or are planning to become pregnant.  If you drink alcohol: ? Limit how much you use to 0-1 drink a day. ? Limit intake if you are breastfeeding.  Be aware of how much alcohol is in your drink. In the [place] one drink equals one 12 oz bottle of beer (355 mL), one 5 oz glass of wine (148 mL), or one 1 oz glass of hard liquor (44 mL). General instructions  Schedule regular health, dental, and eye exams.  Stay current with your vaccines.  Tell your health care provider if: ? You often feel depressed. ? You have ever been abused or do not feel safe at home. Summary  Adopting a healthy lifestyle and getting preventive care are important in promoting health and wellness.  Follow your  health care provider's instructions about healthy diet, exercising, and getting tested or screened for diseases.  Follow your health care provider's instructions on monitoring your cholesterol and blood pressure. This information is not intended to replace advice given to you by your health care provider. Make sure you discuss any questions you have with your health care provider. Document Released: 12/02/2010 Document Revised: 05/12/2018 Document Reviewed: 05/12/2018 Elsevier Patient Education  2020 [employer].

## 2019-02-21 NOTE — Addendum Note (Signed)
Addended by: Marcina Millard on: 02/21/2019 12:53 PM   Modules accepted: Orders

## 2019-02-21 NOTE — Progress Notes (Signed)
Nicole Brooks is a 33 y.o. female with the following history as recorded in EpicCare:  Patient Active Problem List   Diagnosis Date Noted  . Therapeutic drug monitoring 12/01/2017  . Trigeminal neuralgia of right side of face 08/21/2017  . Encounter for sterilization   . Normal labor 04/04/2017  . S/P cesarean section 02/22/2015  . Obesity complicating pregnancy in second trimester   . History of cardiomegaly 08/29/2014  . Migraine 05/12/2012    Current Outpatient Medications  Medication Sig Dispense Refill  . carbamazepine (CARBATROL) 300 MG 12 hr capsule Take 2 capsules (600 mg total) by mouth 2 (two) times daily. 120 capsule 11   No current facility-administered medications for this visit.     Allergies: Patient has no known allergies.  Past Medical History:  Diagnosis Date  . Asthma   . Cardiomegaly   . Trigeminal neuralgia    right    Past Surgical History:  Procedure Laterality Date  . CESAREAN SECTION N/A 02/19/2015   Procedure: CESAREAN SECTION;  Surgeon: Jacob J Stinson, DO;  Location: [facility];  Service: Obstetrics;  Laterality: N/A;  . DILATION AND EVACUATION  05/17/2012   Procedure: DILATATION AND EVACUATION;  Surgeon: Carolyn Harraway-Smith, MD;  Location: [facility];  Service: Gynecology;  Laterality: N/A;  . DILATION AND EVACUATION  05/17/2012  . TOOTH EXTRACTION    . TUBAL LIGATION Bilateral 04/05/2017   Procedure: POST PARTUM TUBAL LIGATION;  Surgeon: Pratt, Tanya S, MD;  Location: [facility];  Service: Gynecology;  Laterality: Bilateral;    Family History  Problem Relation Age of Onset  . Diabetes Mother   . Hypertension Mother     Social History   Tobacco Use  . Smoking status: Never Smoker  . Smokeless tobacco: Never Used  Substance Use Topics  . Alcohol use: No    Subjective:  Patient presents as a new patient; would like to get CPE today; [facility];  Has 2 children- 4 yo son and 2 yo son;  Sleeps 6 hours/  night; Overdue to see eye doctor; up to date on dentist;  LMP-now; tubal ligation  Review of Systems  Constitutional: Negative.   HENT: Negative.   Eyes: Negative.   Respiratory: Negative for shortness of breath.   Cardiovascular: Negative for chest pain.  Gastrointestinal: Negative for abdominal pain.  Genitourinary: Negative.   Musculoskeletal: Negative.   Skin: Negative.   Neurological: Negative.   Endo/Heme/Allergies: Negative.   Psychiatric/Behavioral: Negative.        Objective:  Vitals:   02/21/19 0852  BP: 118/78  Pulse: 79  Temp: 98.3 F (36.8 C)  TempSrc: Oral  SpO2: 99%  Weight: 193 lb 1.3 oz (87.6 kg)  Height: 4' 11" (1.499 m)    General: Well developed, well nourished, in no acute distress  Skin : Warm and dry.  Head: Normocephalic and atraumatic  Eyes: Sclera and conjunctiva clear; pupils round and reactive to light; extraocular movements intact  Ears: External normal; canals clear; after manual removal, left tympanic membranes normal; wax still present in right ear canal Oropharynx: Pink, supple. No suspicious lesions  Neck: Supple without thyromegaly, adenopathy  Lungs: Respirations unlabored; clear to auscultation bilaterally without wheeze, rales, rhonchi  CVS exam: normal rate and regular rhythm.  Abdomen: Soft; nontender; nondistended; normoactive bowel sounds; no masses or hepatosplenomegaly  Musculoskeletal: No deformities; no active joint inflammation  Extremities: No edema, cyanosis, clubbing  Vessels: Symmetric bilaterally  Neurologic: Alert and oriented;   speech intact; face symmetrical; moves all extremities well; CNII-XII intact without focal deficit   Assessment:  1. PE (physical exam), annual   2. Lipid screening   3. Vitamin D deficiency     Plan:  Age appropriate preventive healthcare needs addressed; encouraged regular eye doctor and dental exams; encouraged regular exercise; will update labs and refills as needed today; follow-up  to be determined; Flu shot given today;   No follow-ups on file.  Orders Placed This Encounter  Procedures  . CBC w/Diff    Standing Status:   Future    Standing Expiration Date:   02/21/2020  . Comp Met (CMET)    Standing Status:   Future    Standing Expiration Date:   02/21/2020  . Lipid panel    Standing Status:   Future    Standing Expiration Date:   02/21/2020  . TSH    Standing Status:   Future    Standing Expiration Date:   02/21/2020  . Vitamin D (25 hydroxy)    Standing Status:   Future    Standing Expiration Date:   02/21/2020    Requested Prescriptions    No prescriptions requested or ordered in this encounter

## 2019-02-22 ENCOUNTER — Other Ambulatory Visit: Payer: Self-pay | Admitting: Family

## 2019-02-22 MED ORDER — VITAMIN D (ERGOCALCIFEROL) 1.25 MG (50000 UNIT) PO CAPS: 50000.0000 [IU] | ORAL_CAPSULE | ORAL | 0 refills | Status: AC

## 2019-02-22 MED FILL — VIT D2 1.25 MG (50,000 UNIT: 1.25 MG | 84 days supply | Qty: 12 | Fill #0

## 2019-02-24 MED FILL — CARBAMAZEPINE ER 300 MG CAP: 300 | 30 days supply | Qty: 120 | Fill #9

## 2019-04-04 MED FILL — CARBAMAZEPINE ER 300 MG CAP: 300 | 30 days supply | Qty: 120 | Fill #10

## 2019-05-05 DIAGNOSIS — G500 Trigeminal neuralgia: Secondary | ICD-10-CM | POA: Diagnosis not present

## 2019-05-09 ENCOUNTER — Ambulatory Visit: Payer: 59 | Admitting: Family Medicine

## 2019-05-11 ENCOUNTER — Telehealth: Payer: Self-pay | Admitting: Family Medicine

## 2019-05-11 NOTE — Telephone Encounter (Signed)
Pt asking for a refill on bamazepine (CARBATROL) 300 MG 12 hr capsule [place]

## 2019-05-12 MED ORDER — CARBAMAZEPINE ER 300 MG PO CP12: 300.0000 mg | ORAL_CAPSULE | Freq: Every day | ORAL | 1 refills | Status: DC

## 2019-05-12 MED FILL — CARBAMAZEPINE ER 300 MG CAP: 300 | 90 days supply | Qty: 90 | Fill #0

## 2019-05-12 NOTE — Telephone Encounter (Addendum)
Reordered  As carbatrol 300mg  po daily 90 day supply and one refill (this as noted is last note).

## 2019-05-12 NOTE — Addendum Note (Signed)
Addended by: Brandon Melnick on: 05/12/2019 10:49 AM   Modules accepted: Orders

## 2019-05-17 ENCOUNTER — Ambulatory Visit: Payer: 59 | Admitting: Family Medicine

## 2019-05-31 ENCOUNTER — Encounter (HOSPITAL_COMMUNITY): Payer: Self-pay | Admitting: Emergency Medicine

## 2019-05-31 ENCOUNTER — Ambulatory Visit (HOSPITAL_COMMUNITY)
Admission: EM | Admit: 2019-05-31 | Discharge: 2019-05-31 | Disposition: A | Discharge status: Home / Self Care | Payer: 59 | Attending: Family Medicine | Admitting: Family Medicine

## 2019-05-31 ENCOUNTER — Other Ambulatory Visit: Payer: Self-pay

## 2019-05-31 DIAGNOSIS — G5601 Carpal tunnel syndrome, right upper limb: Secondary | ICD-10-CM

## 2019-05-31 MED ORDER — MELOXICAM 7.5 MG PO TABS: 7.5000 mg | ORAL_TABLET | Freq: Every day | ORAL | 1 refills | Status: DC

## 2019-05-31 NOTE — ED Provider Notes (Signed)
[facility]    CSN: [id number] Arrival date & time: 05/31/19  1810      History   Chief Complaint Chief Complaint  Patient presents with  . Shoulder Pain    HPI Nicole Brooks is a 33 y.o. female.   Complains of shoulder pain but also pain all the way down her arm to her fingers.  Pain is worse at night.  She has a history of carpal tunnel syndrome.  There is been no injury or trauma to the arm or shoulder.  She works in housekeeping at the hospital.  She denies neck pain.  HPI  Past Medical History:  Diagnosis Date  . Asthma   . Cardiomegaly   . Trigeminal neuralgia    right    Patient Active Problem List   Diagnosis Date Noted  . Therapeutic drug monitoring 12/01/2017  . Trigeminal neuralgia of right side of face 08/21/2017  . Encounter for sterilization   . Normal labor 04/04/2017  . S/P cesarean section 02/22/2015  . Obesity complicating pregnancy in second trimester   . History of cardiomegaly 08/29/2014  . Migraine 05/12/2012    Past Surgical History:  Procedure Laterality Date  . CESAREAN SECTION N/A 02/19/2015   Procedure: CESAREAN SECTION;  Surgeon: Levie Heritage, DO;  Location: [facility];  Service: Obstetrics;  Laterality: N/A;  . DILATION AND EVACUATION  05/17/2012   Procedure: DILATATION AND EVACUATION;  Surgeon: Willodean Rosenthal, MD;  Location: [facility];  Service: Gynecology;  Laterality: N/A;  . DILATION AND EVACUATION  05/17/2012  . TOOTH EXTRACTION    . TUBAL LIGATION Bilateral 04/05/2017   Procedure: POST PARTUM TUBAL LIGATION;  Surgeon: Reva Bores, MD;  Location: [facility];  Service: Gynecology;  Laterality: Bilateral;    OB History    Gravida  3   Para  2   Term  2   Preterm  0   AB  1   Living  2     SAB  1   TAB  0   Ectopic  0   Multiple  0   Live Births  2            Home Medications    Prior to Admission medications   Medication Sig Start Date End Date Taking? Authorizing Provider    carbamazepine (CARBATROL) 300 MG 12 hr capsule Take 1 capsule (300 mg total) by mouth daily. 05/12/19  Yes Lomax, Amy, NP    Family History Family History  Problem Relation Age of Onset  . Diabetes Mother   . Hypertension Mother     Social History Social History   Tobacco Use  . Smoking status: Never Smoker  . Smokeless tobacco: Never Used  Substance Use Topics  . Alcohol use: No  . Drug use: No     Allergies   Patient has no known allergies.   Review of Systems Review of Systems  Musculoskeletal: Positive for arthralgias. Negative for neck pain.  All other systems reviewed and are negative.    Physical Exam Triage Vital Signs ED Triage Vitals  Enc Vitals Group     BP 05/31/19 1844 114/77     Pulse Rate 05/31/19 1844 77     Resp 05/31/19 1844 16     Temp 05/31/19 1844 98.6 F (37 C)     Temp Source 05/31/19 1844 Oral     SpO2 05/31/19 1844 100 %     Weight --  Height --      Head Circumference --      Peak Flow --      Pain Score 05/31/19 1843 7     Pain Loc --      Pain Edu? --      Excl. in [place]? --    No data found.  Updated Vital Signs BP 114/77   Pulse 77   Temp 98.6 F (37 C) (Oral)   Resp 16   LMP 04/26/2019   SpO2 100%   Visual Acuity Right Eye Distance:   Left Eye Distance:   Bilateral Distance:    Right Eye Near:   Left Eye Near:    Bilateral Near:     Physical Exam Vitals and nursing note reviewed.  Constitutional:      Appearance: Normal appearance. She is obese.  Musculoskeletal:     Comments: Right shoulder normal contour normal range of motion Strength throughout upper extremity is normal right shoulder and elbow but there is some weakness in the fingers.  There is a positive Tinel's test as well as Phalen's test on the right. Neck shows full range of motion and no C-spine tenderness.  Neurological:     Mental Status: She is alert.      UC Treatments / Results  Labs (all labs ordered are listed, but only  abnormal results are displayed) Labs Reviewed - No data to display  EKG   Radiology No results found.  Procedures Procedures (including critical care time)  Medications Ordered in UC Medications - No data to display  Initial Impression / Assessment and Plan / UC Course  I have reviewed the triage vital signs and the nursing notes.  Pertinent labs & imaging results that were available during my care of the patient were reviewed by me and considered in my medical decision making (see chart for details).     Probable recurrence of carpal tunnel syndrome.  Patient has not been treated previously.  Even though her presentation is shoulder pain I suspect pain is referred upper arm from the area of involvement at the wrist.  We will place her in a wrist splint to wear first at night and then in the daytime as needed Final Clinical Impressions(s) / UC Diagnoses   Final diagnoses:  None   Discharge Instructions   None    ED Prescriptions    None     PDMP not reviewed this encounter.   Wardell Honour, MD 05/31/19 1910

## 2019-05-31 NOTE — ED Triage Notes (Signed)
PT reports shooting pain from right shoulder to right hand. Started 1 week ago, no injury.

## 2019-06-01 MED FILL — MELOXICAM 7.5 MG TABLET: 7.5 | 15 days supply | Qty: 15 | Fill #0

## 2019-07-07 ENCOUNTER — Ambulatory Visit: Payer: 59 | Admitting: Family Medicine

## 2019-07-11 ENCOUNTER — Telehealth: Payer: Self-pay | Admitting: Family Medicine

## 2019-07-11 DIAGNOSIS — G500 Trigeminal neuralgia: Secondary | ICD-10-CM

## 2019-07-11 NOTE — Telephone Encounter (Signed)
Sent referral for [facility] neurology..Does she need neurosurgical referral?

## 2019-07-11 NOTE — Telephone Encounter (Signed)
Pt called wanting to know if she can get a referral for radiation treatment for her mouth. Please advise.

## 2019-07-11 NOTE — Telephone Encounter (Signed)
I called pt and she is needing a referral to Dr. Angelyn Punt with [facility] gamma knife for her second treatment.  (had to re/s due to change in insurance)  [facility],  CONE ID [id number] [facility],  Tele # [phone].

## 2019-07-13 ENCOUNTER — Telehealth: Payer: Self-pay | Admitting: Family Medicine

## 2019-07-13 NOTE — Telephone Encounter (Signed)
Amber from [facility] called wanting to discuss referral for patient. She said they were just seen there back in December for the same thing. Requesting a call back at [phone]

## 2019-07-25 NOTE — Telephone Encounter (Signed)
I called and spoke to [facility] and relayed patient needed to be scheduled. Amber will call me back today .

## 2019-07-26 NOTE — Telephone Encounter (Signed)
Called Patient and and asked her did [facility] Call to schedule her apt. Patient relayed No . I called and left Amber another message asking her to please call patient to schedule.

## 2019-08-02 MED FILL — CARBAMAZEPINE ER 300 MG CAP: 300 | 90 days supply | Qty: 90 | Fill #1

## 2019-08-11 ENCOUNTER — Telehealth: Payer: Self-pay | Admitting: Family Medicine

## 2019-08-11 DIAGNOSIS — G500 Trigeminal neuralgia: Secondary | ICD-10-CM

## 2019-08-11 NOTE — Telephone Encounter (Signed)
I called the # left but could not get thru, will try Monday.  I lMVM for pt as well.

## 2019-08-11 NOTE — Telephone Encounter (Signed)
Pt has called in re: to the referral that was sent to  [facility].  Pt states she is being told by them that they need a referral from her PCP.  Pt states she does not have a PCP.  Pt states that Amy,NP needs to call them re: the referral for her @ [phone]

## 2019-08-15 NOTE — Telephone Encounter (Signed)
Its because of patient's insurance is why she is getting kick back with Gamma Knife team patient will have to use some of her flex spending account to be seen . Please place a urgent order for PCP .  I have talked to patient and she is on board with this plan.

## 2019-08-15 NOTE — Telephone Encounter (Signed)
I spoke to the [facility] at [facility] and Because of her insurance she can not be self pay her PCP has to do order for [facility] there is no way around it . Please place order for PCP . Gamma Knife surgery is 25,000 . No self pay .

## 2019-08-15 NOTE — Addendum Note (Signed)
Addended by: Shawnie Dapper L on: 08/15/2019 09:57 AM   Modules accepted: Orders

## 2019-08-16 NOTE — Telephone Encounter (Signed)
I called pt and I relayed that she needs to contact her insurance and see what they say about out of network fees, relay that her neurologist referred her to Dr. Angelyn Punt who does gamma knife at [facility].  They should be able to guide her in what she next needs to do. She verbalized understanding.

## 2019-08-17 NOTE — Telephone Encounter (Signed)
Noted  

## 2019-08-17 NOTE — Telephone Encounter (Signed)
Patient is scheduled for PCP apt with Nicole Brooks 08/25/2019 arrive at 12:30 for a 1:00 apt. She is scheduled for a med check and referral to [facility]. Records have been faxed to [phone] - telephone . [phone]. Patient is aware of al details I have talked to her to.  [facility].

## 2019-08-31 ENCOUNTER — Ambulatory Visit: Payer: No Typology Code available for payment source | Admitting: Family Medicine

## 2019-08-31 ENCOUNTER — Encounter: Payer: Self-pay | Admitting: Family Medicine

## 2019-08-31 ENCOUNTER — Other Ambulatory Visit: Payer: Self-pay

## 2019-08-31 VITALS — BP 123/78 | HR 77 | Temp 98.6°F | Ht 60.0 in | Wt 198.6 lb

## 2019-08-31 DIAGNOSIS — G500 Trigeminal neuralgia: Secondary | ICD-10-CM

## 2019-08-31 DIAGNOSIS — G43709 Chronic migraine without aura, not intractable, without status migrainosus: Secondary | ICD-10-CM

## 2019-08-31 MED ORDER — CARBAMAZEPINE ER 300 MG PO CP12: 300.0000 mg | ORAL_CAPSULE | Freq: Four times a day (QID) | ORAL | 11 refills | Status: DC

## 2019-08-31 MED ORDER — BACLOFEN 10 MG PO TABS: 10.0000 mg | ORAL_TABLET | Freq: Every day | ORAL | 11 refills | Status: DC

## 2019-08-31 MED ORDER — TOPIRAMATE 25 MG PO TABS: 25.0000 mg | ORAL_TABLET | Freq: Two times a day (BID) | ORAL | 3 refills | Status: DC

## 2019-08-31 MED FILL — CARBAMAZEPINE ER 300 MG CAP: 300 | 30 days supply | Qty: 120 | Fill #0

## 2019-08-31 MED FILL — BACLOFEN 10 MG TABS: 10 | 30 days supply | Qty: 30 | Fill #0

## 2019-08-31 MED FILL — TOPIRAMATE 25 MG TABLET: 25 | 90 days supply | Qty: 180 | Fill #0

## 2019-08-31 NOTE — Patient Instructions (Signed)
We will continue carbamazepine 400mg  four times daily. Add topiramate 25mg  twice daily. You may take Baclofen 10mg  at bedtime if needed.   Follow up closely with PCP and Dr Jacqulynn Cadet for [facility] eval.   Trigeminal Neuralgia  Trigeminal neuralgia is a nerve disorder that causes severe pain on one side of the face. The pain may last from a few seconds to several minutes. The pain is usually only on one side of the face. Symptoms may occur for days, weeks, or months and then go away for months or years. The pain may return and be worse than before. What are the causes? This condition is caused by damage or pressure to a nerve in the head that is called the trigeminal nerve. An attack can be triggered by:  Talking.  Chewing.  Putting on makeup.  Washing your face.  Shaving your face.  Brushing your teeth.  Touching your face. What increases the risk? You are more likely to develop this condition if you:  Are 32 years of age or older.  Are female. What are the signs or symptoms? The main symptom of this condition is severe pain in the:  Jaw.  Lips.  Eyes.  Nose.  Scalp.  Forehead.  Face. The pain may be:  Intense.  Stabbing.  Electric.  Shock-like. How is this diagnosed? This condition is diagnosed with a physical exam. A CT scan or an MRI may be done to rule out other conditions that can cause facial pain. How is this treated? This condition may be treated with:  Avoiding the things that trigger your symptoms.  Taking prescription medicines (anticonvulsants).  Having surgery. This may be done in severe cases if other medical treatment does not provide relief.  Having procedures such as ablation, thermal, or radiation therapy. It may take up to one month for treatment to start relieving the pain. Follow these instructions at home: Managing pain  Learn as much as you can about how to manage your pain. Ask your health care provider if a pain  specialist would be helpful.  Consider talking with a mental health care provider (psychologist) about how to cope with the pain.  Consider joining a pain support group. General instructions  Take over-the-counter and prescription medicines only as told by your health care provider.  Avoid the things that trigger your symptoms. It may help to: ? Chew on the unaffected side of your mouth. ? Avoid touching your face. ? Avoid blasts of hot or cold air.  Follow your treatment plan as told by your health care provider. This may include: ? Cognitive or behavioral therapy. ? Gentle, regular exercise. ? Meditation or yoga. ? Aromatherapy.  Keep all follow-up visits as told by your health care provider. You may need to be monitored closely to make sure treatment is working well for you. Where to find more information  Facial Pain Association: fpa-support.org Contact a health care provider if:  Your medicine is not helping your symptoms.  You have side effects from the medicine used for treatment.  You develop new, unexplained symptoms, such as: ? Double vision. ? Facial weakness. ? Facial numbness. ? Changes in hearing or balance.  You feel depressed. Get help right away if:  Your pain is severe and is not getting better.  You develop suicidal thoughts. If you ever feel like you may hurt yourself or others, or have thoughts about taking your own life, get help right away. You can go to your nearest emergency department  or call:  Your local emergency services (911 in the [place]  A suicide crisis helpline, such as the National Suicide Prevention Lifeline at (225) 688-0422. This is open 24 hours a day. Summary  Trigeminal neuralgia is a nerve disorder that causes severe pain on one side of the face. The pain may last from a few seconds to several minutes.  This condition is caused by damage or pressure to a nerve in the head that is called the trigeminal nerve.  Treatment may  include avoiding the things that trigger your symptoms, taking medicines, or having surgery or procedures. It may take up to one month for treatment to start relieving the pain.  Avoid the things that trigger your symptoms.  Keep all follow-up visits as told by your health care provider. You may need to be monitored closely to make sure treatment is working well for you. This information is not intended to replace advice given to you by your health care provider. Make sure you discuss any questions you have with your health care provider. Document Revised: 04/05/2018 Document Reviewed: 04/05/2018 Elsevier Patient Education  2020 [employer].

## 2019-08-31 NOTE — Progress Notes (Signed)
I reviewed note and agree with plan.   Suanne Marker, MD 08/31/2019, 2:57 PM Certified in Neurology, Neurophysiology and Neuroimaging  [facility] [address], [address] [place], [place] [address] [phone]

## 2019-08-31 NOTE — Progress Notes (Signed)
PATIENT: Nicole Brooks DOB: [birth year]  REASON FOR VISIT: follow up HISTORY FROM: patient  Chief Complaint  Patient presents with  . Follow-up    Rm 2, alone   . Trigeminal Neuralgia R    has flares, worsening sx, takes carbatrol 4-5 x daily prn.       HISTORY OF PRESENT ILLNESS: Today 08/31/19 Nicole Brooks is a 34 y.o. female here today for follow up for TN. She continues carbamazepine 300mg  but has had to increase the dose from 1 to 4 tablets daily. She was planning to have second gamma knife treatment in the fall of 2020 but was unable to have procedure due to switching insurance plans. She continues to have sharp, stabbing pain of the right jaw line. Pain usually triggers a migraine. She has headaches nearly every day. She has migrainous symptoms of light sensitivity and nausea about 2-3 times a week. Pain is worse at night and keeps her awake. She has tried and failed multiple other treatments including gabapentin, amitriptyline, Indocin.   HISTORY: (copied from my note on 11/04/2018)  Nicole Brooks is a 34 y.o. female here today for follow up of trigeminal. She was referred to [facility] for consideration of gamma knife therapy.  She reports that on April 30 she had her first gamma knife procedure.  She reports a significant reduction in pain levels.  She is only taking 1 carbamazepine 300 mg tablet per day.  She is very pleased with the results of the gamma knife treatment.  She states that she is scheduled to repeat therapy in August.  She is hoping to discontinue medications at that time.   HISTORY (copied from [employer] note on 07/06/2018)  UPDATE2/4/2020CMMs. Nicole Brooks, 34 year old female returns for follow-up with history of trigeminal neuralgia on the right sometimes radiating to the right ear. She continues to have pretty significant pain. She is currently on carbamazepine 600 mg twice daily. Low-dose Elavil was restarted at her last visit but she had side  effects of dizziness. She hastried Neurontin but had side effects of dizziness. Indocin was not helpful. She has not missed any work due to her pain however she says "I just work through it". She has never had.periods of relief from her pain. She returns for reevaluation  UPDATE 11/4/2019CMMs. Nicole Brooks, 34 year old female returns for follow-up with history of trigeminal neuralgia on the right. She continues to have intermittent attacks on the face. Talking brushing her teeth are triggers. She is currently on carbamazepine 600 twice daily. Level at last visit was 7.9. Amitriptyline was added but when she got to 20 mg she had dizziness so she stopped it altogether. She returns for reevaluation. She has tried to keep her face covered since the weather has turned colder.  UPDATE 7/2/2019CMMs. Nicole Brooks, 34 year old female returns for follow-up with history of right trigeminal neuralgia. She continues to have frequent sharp attacks in the face on the right. Talking, brushing her teeth are triggers . She is currently on carbamazepine 600 mg twice daily. Neurontin was added at her last visit however she claims she had side effects of dizziness the next morning after taking the medication. She returns for reevaluation  UPDATE 3/22/2019CM Nicole Brooks, 34 year old female returns for follow-up with history of right trigeminal neuralgia. She continues to have daily sharp attacks in the face on the right. Brushing her teeth, drinking cold fluids or being exposed to the cold air causes sharp attacks. She tries to chew on the left side.  600 mg twice daily. After her last visit she took Indocin for 1 month but did not notice much difference. She was seen in the emergency room last week for dizziness and headache. She was found to be dehydrated and given IV fluids. She has not had further dizziness or headache. She returns for reevaluation  UPDATE (05/21/17, VRP): Since last visit,  doingabout the same. Now on CBZ 400mg twice a dayx 1 week, without benefit. Daily sharp attacks in right face.No alleviating factors.Biting hard food, brushing teeth, drinking cold liquids aggravates pain. Also has pain without any provoking factors.  UPDATE (03/27/17, VRP): Since last visit, was doing better, but now pain returned. Taking tylenol without relief. C-section scheduled for 04/17/17 (now [redacted] weeks EGA). Cold and hard food still are aggravating factors.   PRIOR HPI (12/08/16, VRP): 34 year old female with right facial pain. Patient is currently [redacted] weeks pregnant.  In April 2018 patient had onset of pain in her right lower jaw region. Her dentist found tooth #30 was tender to percussion. Therefore she had extraction of tooth #30 on 10/17/16. However symptoms continued. Patient had tooth #29 removed after 1 week. However symptoms continued to persist. Patient feels pain in her right ear, right jaw, tenderness to touch in her right face. Cold drinks and hard foods aggravate the pain. Patient had similar problems in 2016 during her last pregnancy. Symptoms resolved spontaneously at that time.  No problems with left face. No problems with arms or legs. No other specific triggering or aggravating factors.  Patient getting her OB/GYN care through [facility].  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   REVIEW OF SYSTEMS: Out of a complete 14 system review of symptoms, the patient complains only of the following symptoms, facial pain, headaches and all other reviewed systems are negative.  ALLERGIES: No Known Allergies  HOME MEDICATIONS: Outpatient Medications Prior to Visit  Medication Sig Dispense Refill  . meloxicam (MOBIC) 7.5 MG tablet Take 1 tablet (7.5 mg total) by mouth daily. 15 tablet 1  . carbamazepine (CARBATROL) 300 MG 12 hr capsule Take 1 capsule (300 mg total) by mouth daily. (Patient taking differently: Take 300 mg by  mouth 4 (four) times daily. ) 90 capsule 1   No facility-administered medications prior to visit.    PAST MEDICAL HISTORY: Past Medical History:  Diagnosis Date  . Asthma   . Cardiomegaly   . Trigeminal neuralgia    right    PAST SURGICAL HISTORY: Past Surgical History:  Procedure Laterality Date  . CESAREAN SECTION N/A 02/19/2015   Procedure: CESAREAN SECTION;  Surgeon: 02/21/2015, DO;  Location: [facility];  Service: Obstetrics;  Laterality: N/A;  . DILATION AND EVACUATION  05/17/2012   Procedure: DILATATION AND EVACUATION;  Surgeon: 05/19/2012, MD;  Location: [facility] OR[facility];  Service: Gynecology;  Laterality: N/A;  . DILATION AND EVACUATION  05/17/2012  . TOOTH EXTRACTION    . TUBAL LIGATION Bilateral 04/05/2017   Procedure: POST PARTUM TUBAL LIGATION;  Surgeon: 13/08/2016, MD;  Location: [facility]ING SU[facility];  Service: Gynecology;  Laterality: Bilateral;    FAMILY HISTORY: Family History  Problem Relation Age of Onset  . Diabetes Mother   . Hypertension Mother     SOCIAL HISTORY: Social History   Socioeconomic History  . Marital status: Single    Spouse name: Not on file  . Number of children: 2  . Years of education: Not on file  . 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 Smoking status: Never Smoker  . Smokeless tobacco: Never Used  Substance and Sexual Activity  . Alcohol use: No  . Drug use: No  . Sexual activity: Yes    Birth control/protection: None  Other Topics Concern  . Not on file  Social History Narrative   Lives home with SO and her 2 children.  Works at [employer]   .  Drinks coffee sometimes.   Social Determinants of Health   Financial Resource Strain:   . Difficulty of Paying Living Expenses:   Food Insecurity:   . Worried About Programme researcher, broadcasting/film/video in the Last Year:   . Barista in the Last Year:   Transportation Needs:     . Freight forwarder (Medical):   Marland Kitchen Lack of Transportation (Non-Medical):   Physical Activity:   . Days of Exercise per Week:   . Minutes of Exercise per Session:   Stress:   . Feeling of Stress :   Social Connections:   . Frequency of Communication with Friends and Family:   . Frequency of Social Gatherings with Friends and Family:   . Attends Religious Services:   . Active Member of Clubs or Organizations:   . Attends Banker Meetings:   Marland Kitchen Marital Status:   Intimate Partner Violence:   . Fear of Current or Ex-Partner:   . Emotionally Abused:   Marland Kitchen Physically Abused:   . Sexually Abused:       PHYSICAL EXAM  Vitals:   08/31/19 0726  BP: 123/78  Pulse: 77  Temp: 98.6 F (37 C)  Weight: 198 lb 9.6 oz (90.1 kg)  Height: 5' (1.524 m)   Body mass index is 38.79 kg/m.  Generalized: Well developed, in no acute distress  Cardiology: normal rate and rhythm, no murmur noted Neurological examination  Mentation: Alert oriented to time, place, history taking. Follows all commands speech and language fluent Cranial nerve II-XII: Pupils were equal round reactive to light. Extraocular movements were full, visual field were full on confrontational test. Facial sensation and strength were normal. Uvula tongue midline. Head turning and shoulder shrug  were normal and symmetric. Motor: The motor testing reveals 5 over 5 strength of all 4 extremities. Good symmetric motor tone is noted throughout.  Sensory: Sensory testing is intact to soft touch on all 4 extremities. No evidence of extinction is noted.  Coordination: Cerebellar testing reveals good finger-nose-finger and heel-to-shin bilaterally.  Gait and station: Gait is normal.   DIAGNOSTIC DATA (LABS, IMAGING, TESTING) - I reviewed patient records, labs, notes, testing and imaging myself where available.  No flowsheet data found.   Lab Results  Component Value Date   WBC 5.5 02/21/2019   HGB 11.1 (L)  02/21/2019   HCT 34.8 (L) 02/21/2019   MCV 88.9 02/21/2019   PLT 296.0 02/21/2019      Component Value Date/Time   NA 140 02/21/2019 0937   K 4.0 02/21/2019 0937   CL 105 02/21/2019 0937   CO2 28 02/21/2019 0937   GLUCOSE 90 02/21/2019 0937   BUN 18 02/21/2019 0937   CREATININE 0.57 02/21/2019 0937   CALCIUM 9.1 02/21/2019 0937   PROT 7.0 02/21/2019 0937   ALBUMIN 3.7 02/21/2019 0937   AST 11 02/21/2019 0937   ALT 8 02/21/2019 0937   ALKPHOS 102 02/21/2019 0937   BILITOT 0.2  02/21/2019 0937   GFRNONAA >60 08/16/2017 1023   GFRAA >60 08/16/2017 1023   Lab Results  Component Value Date   CHOL 168 02/21/2019   HDL 77.00 02/21/2019   LDLCALC 84 02/21/2019   TRIG 32.0 02/21/2019   CHOLHDL 2 02/21/2019   No results found for: HGBA1C No results found for: VITAMINB12 Lab Results  Component Value Date   TSH 0.64 02/21/2019     ASSESSMENT AND PLAN 34 y.o. year old female  has a past medical history of Asthma, Cardiomegaly, and Trigeminal neuralgia. here with     ICD-10-CM   1. Trigeminal neuralgia of right side of face  G50.0   2. Chronic migraine without aura without status migrainosus, not intractable  G43.709     Aashika has had worsening of trigeminal neuralgia pain over the past 8 months.  She has increased carbamazepine to 4 tablets daily some relief.  We will continue carbamazepine at 4 tablets daily.  She is also complaining of frequent headaches with migrainous symptoms.  We will add topiramate for both migraine prevention and trigeminal neuralgia.  She may take baclofen 10 mg at bedtime to help with pain and sleep.  She will follow-up very closely with primary care and [facility] neurosurgery for consideration of additional gamma knife treatments as appropriate.  She will follow-up with me in 6 months, sooner if needed.  She verbalizes understanding and agreement with this plan.   No orders of the defined types were placed in this encounter.    Meds ordered this  encounter  Medications  . topiramate (TOPAMAX) 25 MG tablet    Sig: Take 1 tablet (25 mg total) by mouth 2 (two) times daily.    Dispense:  180 tablet    Refill:  3    Order Specific Question:   Supervising Provider    Answer:   Anson Fret [id number]  . baclofen (LIORESAL) 10 MG tablet    Sig: Take 1 tablet (10 mg total) by mouth at bedtime.    Dispense:  30 each    Refill:  11    Order Specific Question:   Supervising Provider    Answer:   Anson Fret [id number]  . carbamazepine (CARBATROL) 300 MG 12 hr capsule    Sig: Take 1 capsule (300 mg total) by mouth 4 (four) times daily.    Dispense:  120 capsule    Refill:  11    Order Specific Question:   Supervising Provider    Answer:   Anson Fret [id number]      I spent 20 minutes with the patient. 50% of this time was spent counseling and educating patient on plan of care and medications.    Shawnie Dapper, FNP-C 08/31/2019, 7:55 AM [facility] [address], [address] [place], [place] [address] [phone]

## 2019-09-28 MED FILL — CARBAMAZEPINE ER 300 MG CAP: 300 | 30 days supply | Qty: 120 | Fill #1

## 2019-09-30 ENCOUNTER — Other Ambulatory Visit (HOSPITAL_COMMUNITY): Payer: Self-pay | Admitting: Neurosurgery

## 2019-09-30 ENCOUNTER — Other Ambulatory Visit: Payer: Self-pay | Admitting: Physician Assistant

## 2019-09-30 ENCOUNTER — Other Ambulatory Visit: Payer: Self-pay | Admitting: Neurosurgery

## 2019-09-30 DIAGNOSIS — G500 Trigeminal neuralgia: Secondary | ICD-10-CM

## 2019-10-01 HISTORY — PX: OTHER SURGICAL HISTORY: SHX169

## 2019-10-06 ENCOUNTER — Encounter (HOSPITAL_COMMUNITY): Payer: Self-pay

## 2019-10-06 ENCOUNTER — Ambulatory Visit (HOSPITAL_COMMUNITY): Payer: No Typology Code available for payment source

## 2019-11-15 MED FILL — HYDROCODON-APAP 5-325: 5-325 | 2 days supply | Qty: 10 | Fill #0

## 2020-03-07 ENCOUNTER — Ambulatory Visit: Payer: No Typology Code available for payment source | Admitting: Family Medicine

## 2020-03-15 NOTE — Progress Notes (Signed)
Chief Complaint  Patient presents with  . Follow-up    Trigeminal Neuralgia, Rm 9, had Gamma Knife 10/2019 [facility] Dr. Angelyn Punt, R facial numbness, no pain.  off all meds except advil (or headaches).      HISTORY OF PRESENT ILLNESS: Today 03/19/20  Nicole Brooks is a 34 y.o. female here today for follow up for TN. She had second gamma knife procedure on 10/26/2019 with Dr Angelyn Punt. TN pain has completely resolved. She is doing very well post op with the exception of headaches. She continues to have a tension style headache nearly every day. No migraine symtpoms. She is taking Advil 1 tablet twice daily. She denies history of kidney disease or kidney stones. She tolerated topiramate well.    HISTORY (copied from my note on 08/31/2019)  Nicole Brooks is a 34 y.o. female here today for follow up for TN. She continues carbamazepine 300mg  but has had to increase the dose from 1 to 4 tablets daily. She was planning to have second gamma knife treatment in the fall of 2020 but was unable to have procedure due to switching insurance plans. She continues to have sharp, stabbing pain of the right jaw line. Pain usually triggers a migraine. She has headaches nearly every day. She has migrainous symptoms of light sensitivity and nausea about 2-3 times a week. Pain is worse at night and keeps her awake. She has tried and failed multiple other treatments including gabapentin, amitriptyline, Indocin.   HISTORY: (copied from my note on 11/04/2018)  Nicole L Bradleyis a 34 y.o.femalehere today for follow up of trigeminal.She was referred to [facility] for consideration of gamma knife therapy.She reports that on April 30 she had her first gamma knife procedure. She reports a significant reduction in pain levels. She is only taking 1 carbamazepine 300 mg tablet per day. She is very pleased with the results of the gamma knife treatment. She states that she is scheduled to repeat therapy in August. She is  hoping to discontinue medications at that time.   HISTORY(copied from September note on 07/06/2018)  UPDATE2/4/2020CMMs. Nicole Brooks, 34 year old female returns for follow-up with history of trigeminal neuralgia on the right sometimes radiating to the right ear. She continues to have pretty significant pain. She is currently on carbamazepine 600 mg twice daily. Low-dose Elavil was restarted at her last visit but she had side effects of dizziness. She hastried Neurontin but had side effects of dizziness. Indocin was not helpful. She has not missed any work due to her pain however she says "I just work through it". She has never had.periods of relief from her pain. She returns for reevaluation  UPDATE 11/4/2019CMMs. Nicole Brooks, 34 year old female returns for follow-up with history of trigeminal neuralgia on the right. She continues to have intermittent attacks on the face. Talking brushing her teeth are triggers. She is currently on carbamazepine 600 twice daily. Level at last visit was 7.9. Amitriptyline was added but when she got to 20 mg she had dizziness so she stopped it altogether. She returns for reevaluation. She has tried to keep her face covered since the weather has turned colder.  UPDATE 7/2/2019CMMs. Nicole Brooks, 34 year old female returns for follow-up with history of right trigeminal neuralgia. She continues to have frequent sharp attacks in the face on the right. Talking, brushing her teeth are triggers . She is currently on carbamazepine 600 mg twice daily. Neurontin was added at her last visit however she claims she had side effects of dizziness  the next morning after taking the medication. She returns for reevaluation  UPDATE 3/22/2019CMMs. Nicole Brooks, 34 year old female returns for follow-up with history of right trigeminal neuralgia. She continues to have daily sharp attacks in the face on the right. Brushing her teeth, drinking cold fluids or being exposed  to the cold air causes sharp attacks. She tries to chew on the left side. 600 mg twice daily. After her last visit she took Indocin for 1 month but did not notice much difference. She was seen in the emergency room last week for dizziness and headache. She was found to be dehydrated and given IV fluids. She has not had further dizziness or headache. She returns for reevaluation  UPDATE (05/21/17, VRP): Since last visit, doingabout the same. Now on CBZ 400mg twice a dayx 1 week, without benefit. Daily sharp attacks in right face.No alleviating factors.Biting hard food, brushing teeth, drinking cold liquids aggravates pain. Also has pain without any provoking factors.  UPDATE (03/27/17, VRP): Since last visit, was doing better, but now pain returned. Taking tylenol without relief. C-section scheduled for 04/17/17 (now [redacted] weeks EGA). Cold and hard food still are aggravating factors.   PRIOR HPI (12/08/16, VRP): 34 year old female with right facial pain. Patient is currently [redacted] weeks pregnant.  In April 2018 patient had onset of pain in her right lower jaw region. Her dentist found tooth #30 was tender to percussion. Therefore she had extraction of tooth #30 on 10/17/16. However symptoms continued. Patient had tooth #29 removed after 1 week. However symptoms continued to persist. Patient feels pain in her right ear, right jaw, tenderness to touch in her right face. Cold drinks and hard foods aggravate the pain. Patient had similar problems in 2016 during her last pregnancy. Symptoms resolved spontaneously at that time.  No problems with left face. No problems with arms or legs. No other specific triggering or aggravating factors.  Patient getting her OB/GYN care through [facility].  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     REVIEW OF SYSTEMS: Out of a complete 14 system review of symptoms, the patient complains only of the following  symptoms, headaches and all other reviewed systems are negative.   ALLERGIES: No Known Allergies   HOME MEDICATIONS: Outpatient Medications Prior to Visit  Medication Sig Dispense Refill  . ibuprofen (ADVIL) 100 MG/5ML suspension Take 200 mg by mouth every 6 (six) hours.    . baclofen (LIORESAL) 10 MG tablet Take 1 tablet (10 mg total) by mouth at bedtime. (Patient not taking: Reported on 03/19/2020) 30 each 11  . carbamazepine (CARBATROL) 300 MG 12 hr capsule Take 1 capsule (300 mg total) by mouth 4 (four) times daily. (Patient not taking: Reported on 03/19/2020) 120 capsule 11  . meloxicam (MOBIC) 7.5 MG tablet Take 1 tablet (7.5 mg total) by mouth daily. (Patient not taking: Reported on 03/19/2020) 15 tablet 1  . topiramate (TOPAMAX) 25 MG tablet Take 1 tablet (25 mg total) by mouth 2 (two) times daily. (Patient not taking: Reported on 03/19/2020) 180 tablet 3   No facility-administered medications prior to visit.     PAST MEDICAL HISTORY: Past Medical History:  Diagnosis Date  . Asthma   . Cardiomegaly   . Trigeminal neuralgia    right     PAST SURGICAL HISTORY: Past Surgical History:  Procedure Laterality Date  . CESAREAN SECTION N/A 02/19/2015   Procedure: CESAREAN SECTION;  Surgeon: 02/21/2015, DO;  Location: [facility];  Service:  Obstetrics;  Laterality: N/A;  . DILATION AND EVACUATION  05/17/2012   Procedure: DILATATION AND EVACUATION;  Surgeon: Willodean Rosenthal, MD;  Location: [facility];  Service: Gynecology;  Laterality: N/A;  . DILATION AND EVACUATION  05/17/2012  . GAMMA KNIFE  10/2019   [facility]  . TOOTH EXTRACTION    . TUBAL LIGATION Bilateral 04/05/2017   Procedure: POST PARTUM TUBAL LIGATION;  Surgeon: Reva Bores, MD;  Location: [facility];  Service: Gynecology;  Laterality: Bilateral;     FAMILY HISTORY: Family History  Problem Relation Age of Onset  . Diabetes Mother   . Hypertension Mother      SOCIAL HISTORY: Social  History   Socioeconomic History  . Marital status: Single    Spouse name: Not on file  . Number of children: 2  . Years of education: Not on file  . 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 Smoking status: Never Smoker  . Smokeless tobacco: Never Used  Vaping Use  . Vaping Use: Never used  Substance and Sexual Activity  . Alcohol use: No  . Drug use: No  . Sexual activity: Yes    Birth control/protection: None  Other Topics Concern  . Not on file  Social History Narrative   Lives home with SO and her 2 children.  Works at [employer]   .  Drinks coffee sometimes.   Social Determinants of Health   Financial Resource Strain:   . Difficulty of Paying Living Expenses: Not on file  Food Insecurity:   . Worried About Programme researcher, broadcasting/film/video in the Last Year: Not on file  . Ran Out of Food in the Last Year: Not on file  Transportation Needs:   . Lack of Transportation (Medical): Not on file  . Lack of Transportation (Non-Medical): Not on file  Physical Activity:   . Days of Exercise per Week: Not on file  . Minutes of Exercise per Session: Not on file  Stress:   . Feeling of Stress : Not on file  Social Connections:   . Frequency of Communication with Friends and Family: Not on file  . Frequency of Social Gatherings with Friends and Family: Not on file  . Attends Religious Services: Not on file  . Active Member of Clubs or Organizations: Not on file  . Attends Banker Meetings: Not on file  . Marital Status: Not on file  Intimate Partner Violence:   . Fear of Current or Ex-Partner: Not on file  . Emotionally Abused: Not on file  . Physically Abused: Not on file  . Sexually Abused: Not on file      PHYSICAL EXAM  Vitals:   03/19/20 0727  BP: 115/84  Pulse: 82  Weight: 188 lb 6.4 oz (85.5 kg)  Height: 5' (1.524 m)   Body mass index is 36.79  kg/m.   Generalized: Well developed, in no acute distress   Neurological examination  Mentation: Alert oriented to time, place, history taking. Follows all commands speech and language fluent Cranial nerve II-XII: Pupils were equal round reactive to light. Extraocular movements were full, visual field were full on confrontational test. Facial sensation and strength were normal. Uvula tongue midline. Head turning and shoulder shrug  were normal and symmetric. Motor: The motor testing reveals 5 over 5 strength of all 4 extremities. Good symmetric motor tone is noted throughout.  Sensory: Sensory testing is intact to soft touch on all 4 extremities. No evidence of extinction is noted.  Coordination: Cerebellar testing reveals good finger-nose-finger and heel-to-shin bilaterally.  Gait and station: Gait is normal.     DIAGNOSTIC DATA (LABS, IMAGING, TESTING) - I reviewed patient records, labs, notes, testing and imaging myself where available.  Lab Results  Component Value Date   WBC 5.5 02/21/2019   HGB 11.1 (L) 02/21/2019   HCT 34.8 (L) 02/21/2019   MCV 88.9 02/21/2019   PLT 296.0 02/21/2019      Component Value Date/Time   NA 140 02/21/2019 0937   K 4.0 02/21/2019 0937   CL 105 02/21/2019 0937   CO2 28 02/21/2019 0937   GLUCOSE 90 02/21/2019 0937   BUN 18 02/21/2019 0937   CREATININE 0.57 02/21/2019 0937   CALCIUM 9.1 02/21/2019 0937   PROT 7.0 02/21/2019 0937   ALBUMIN 3.7 02/21/2019 0937   AST 11 02/21/2019 0937   ALT 8 02/21/2019 0937   ALKPHOS 102 02/21/2019 0937   BILITOT 0.2 02/21/2019 0937   GFRNONAA >60 08/16/2017 1023   GFRAA >60 08/16/2017 1023   Lab Results  Component Value Date   CHOL 168 02/21/2019   HDL 77.00 02/21/2019   LDLCALC 84 02/21/2019   TRIG 32.0 02/21/2019   CHOLHDL 2 02/21/2019   No results found for: HGBA1C No results found for: VITAMINB12 Lab Results  Component Value Date   TSH 0.64 02/21/2019      ASSESSMENT AND PLAN  34 y.o.  year old female  has a past medical history of Asthma, Cardiomegaly, and Trigeminal neuralgia. here with   Trigeminal neuralgia of right side of face   Juliyah is doing very well post gamma knife procedure in 10/2019. TN pain is resolved, however, she continues to have tension style headaches. She was advised that this was possible for up to a year following surgery. We will restart topiramate 25mg  BID as she tolerated this well previously. She was encouraged to stay well hydrated. Well balanced diet and regular exercise encouraged. She will follow up with PCP if willing to refill topiramate and may return to us as needed. She verbalizes understanding and agreement with this plan.   I spent 20 minutes of face-to-face and non-face-to-face time with patient.  This included previsit chart review, lab review, study review, order entry, electronic health record documentation, patient education.    Shawnie DapperAmy Uriyah Massimo, MSN, FNP-C 03/19/2020, 7:57 AM  [facility] [address], [address] [place], [place] [address] [phone]

## 2020-03-19 ENCOUNTER — Encounter: Payer: Self-pay | Admitting: Family Medicine

## 2020-03-19 ENCOUNTER — Other Ambulatory Visit: Payer: Self-pay

## 2020-03-19 ENCOUNTER — Ambulatory Visit: Payer: Self-pay | Admitting: Family Medicine

## 2020-03-19 VITALS — BP 115/84 | HR 82 | Ht 60.0 in | Wt 188.4 lb

## 2020-03-19 DIAGNOSIS — G500 Trigeminal neuralgia: Secondary | ICD-10-CM

## 2020-03-19 MED ORDER — TOPIRAMATE 25 MG PO TABS: 25.0000 mg | ORAL_TABLET | Freq: Two times a day (BID) | ORAL | 3 refills | Status: AC

## 2020-03-19 MED FILL — TOPIRAMATE 25 MG TABLET: 25 | 90 days supply | Qty: 180 | Fill #0

## 2020-03-19 NOTE — Patient Instructions (Addendum)
We will restart topiramate  twice daily. Start with 1 tablet at bedtime for 1-2 weeks then increase to 1 tablet twice daily. You can continue Advil as needed but try to avoid using this more than 1-2 times weekly.   Stay well hydrated. Well balanced diet and regular exercise advised.   Follow up with PCP, may return to see [place] as needed.    Topiramate tablets What is this medicine? TOPIRAMATE (toe PYRE a mate) is used to treat seizures in adults or children with epilepsy. It is also used for the prevention of migraine headaches. This medicine may be used for other purposes; ask your health care provider or pharmacist if you have questions. COMMON BRAND NAME(S): Topamax, Topiragen What should I tell my health care provider before I take this medicine? They need to know if you have any of these conditions:  bleeding disorders  kidney disease  lung or breathing disease, like asthma  suicidal thoughts, plans, or attempt; a previous suicide attempt by you or a family member  an unusual or allergic reaction to topiramate, other medicines, foods, dyes, or preservatives  pregnant or trying to get pregnant  breast-feeding How should I use this medicine? Take this medicine by mouth with a glass of water. Follow the directions on the prescription label. Do not cut, crush or chew this medicine. Swallow the tablets whole. You can take it with or without food. If it upsets your stomach, take it with food. Take your medicine at regular intervals. Do not take it more often than directed. Do not stop taking except on your doctor's advice. A special MedGuide will be given to you by the pharmacist with each prescription and refill. Be sure to read this information carefully each time. Talk to your pediatrician regarding the use of this medicine in children. While this drug may be prescribed for children as young as 55 years of age for selected conditions, precautions do apply. Overdosage: If you  think you have taken too much of this medicine contact a poison control center or emergency room at once. NOTE: This medicine is only for you. Do not share this medicine with others. What if I miss a dose? If you miss a dose, take it as soon as you can. If your next dose is to be taken in less than 6 hours, then do not take the missed dose. Take the next dose at your regular time. Do not take double or extra doses. What may interact with this medicine? This medicine may interact with the following medications:  acetazolamide  alcohol  antihistamines for allergy, cough, and cold  aspirin and aspirin-like medicines  atropine  birth control pills  certain medicines for anxiety or sleep  certain medicines for bladder problems like oxybutynin, tolterodine  certain medicines for depression like amitriptyline, fluoxetine, sertraline  certain medicines for seizures like carbamazepine, phenobarbital, phenytoin, primidone, valproic acid, zonisamide  certain medicines for stomach problems like dicyclomine, hyoscyamine  certain medicines for travel sickness like scopolamine  certain medicines for Parkinson's disease like benztropine, trihexyphenidyl  certain medicines that treat or prevent blood clots like warfarin, enoxaparin, dalteparin, apixaban, dabigatran, and rivaroxaban  digoxin  general anesthetics like halothane, isoflurane, methoxyflurane, propofol  hydrochlorothiazide  ipratropium  lithium  medicines that relax muscles for surgery  metformin  narcotic medicines for pain  NSAIDs, medicines for pain and inflammation, like ibuprofen or naproxen  phenothiazines like chlorpromazine, mesoridazine, prochlorperazine, thioridazine  pioglitazone This list may not describe all possible interactions. Give  your health care provider a list of all the medicines, herbs, non-prescription drugs, or dietary supplements you use. Also tell them if you smoke, drink alcohol, or use  illegal drugs. Some items may interact with your medicine. What should I watch for while using this medicine? Visit your doctor or health care professional for regular checks on your progress. Tell your health care professional if your symptoms do not start to get better or if they get worse. Do not stop taking except on your health care professional's advice. You may develop a severe reaction. Your health care professional will tell you how much medicine to take. Wear a medical ID bracelet or chain. Carry a card that describes your disease and details of your medicine and dosage times. This medicine can reduce the response of your body to heat or cold. Dress warm in cold weather and stay hydrated in hot weather. If possible, avoid extreme temperatures like saunas, hot tubs, very hot or cold showers, or activities that can cause dehydration such as vigorous exercise. Check with your health care professional if you have severe diarrhea, nausea, and vomiting, or if you sweat a lot. The loss of too much body fluid may make it dangerous for you to take this medicine. You may get drowsy or dizzy. Do not drive, use machinery, or do anything that needs mental alertness until you know how this medicine affects you. Do not stand up or sit up quickly, especially if you are an older patient. This reduces the risk of dizzy or fainting spells. Alcohol may interfere with the effect of this medicine. Avoid alcoholic drinks. Tell your health care professional right away if you have any change in your eyesight. Patients and their families should watch out for new or worsening depression or thoughts of suicide. Also watch out for sudden changes in feelings such as feeling anxious, agitated, panicky, irritable, hostile, aggressive, impulsive, severely restless, overly excited and hyperactive, or not being able to sleep. If this happens, especially at the beginning of treatment or after a change in dose, call your healthcare  professional. This medicine may cause serious skin reactions. They can happen weeks to months after starting the medicine. Contact your health care provider right away if you notice fevers or flu-like symptoms with a rash. The rash may be red or purple and then turn into blisters or peeling of the skin. Or, you might notice a red rash with swelling of the face, lips or lymph nodes in your neck or under your arms. Birth control may not work properly while you are taking this medicine. Talk to your health care professional about using an extra method of birth control. Women should inform their health care professional if they wish to become pregnant or think they might be pregnant. There is a potential for serious side effects and harm to an unborn child. Talk to your health care professional for more information. What side effects may I notice from receiving this medicine? Side effects that you should report to your doctor or health care professional as soon as possible:  allergic reactions like skin rash, itching or hives, swelling of the face, lips, or tongue  blood in the urine  changes in vision  confusion  loss of memory  pain in lower back or side  pain when urinating  redness, blistering, peeling or loosening of the skin, including inside the mouth  signs and symptoms of bleeding such as bloody or black, tarry stools; red or dark brown urine;  spitting up blood or brown material that looks like coffee grounds; red spots on the skin; unusual bruising or bleeding from the eyes, gums, or nose  signs and symptoms of increased acid in the body like breathing fast; fast heartbeat; headache; confusion; unusually weak or tired; nausea, vomiting  suicidal thoughts, mood changes  trouble speaking or understanding  unusual sweating  unusually weak or tired Side effects that usually do not require medical attention (report to your doctor or health care professional if they continue or are  bothersome):  dizziness  drowsiness  fever  loss of appetite  nausea, vomiting  pain, tingling, numbness in the hands or feet  stomach pain  tiredness  upset stomach This list may not describe all possible side effects. Call your doctor for medical advice about side effects. You may report side effects to FDA at 1-800-FDA-1088. Where should I keep my medicine? Keep out of the reach of children. Store at room temperature between 15 and 30 degrees C (59 and 86 degrees F). Throw away any unused medicine after the expiration date. NOTE: This sheet is a summary. It may not cover all possible information. If you have questions about this medicine, talk to your doctor, pharmacist, or health care provider.  2020 Elsevier/Gold Standard (2018-12-16 15:07:20)   General Headache Without Cause A headache is pain or discomfort that is felt around the head or neck area. There are many causes and types of headaches. In some cases, the cause may not be found. Follow these instructions at home: Watch your condition for any changes. Let your doctor know about them. Take these steps to help with your condition: Managing pain      Take over-the-counter and prescription medicines only as told by your doctor.  Lie down in a dark, quiet room when you have a headache.  If told, put ice on your head and neck area: ? Put ice in a plastic bag. ? Place a towel between your skin and the bag. ? Leave the ice on for 20 minutes, 2-3 times per day.  If told, put heat on the affected area. Use the heat source that your doctor recommends, such as a moist heat pack or a heating pad. ? Place a towel between your skin and the heat source. ? Leave the heat on for 20-30 minutes. ? Remove the heat if your skin turns bright red. This is very important if you are unable to feel pain, heat, or cold. You may have a greater risk of getting burned.  Keep lights dim if bright lights bother you or make your  headaches worse. Eating and drinking  Eat meals on a regular schedule.  If you drink alcohol: ? Limit how much you use to:  0-1 drink a day for women.  0-2 drinks a day for men. ? Be aware of how much alcohol is in your drink. In the [place] one drink equals one 12 oz bottle of beer (355 mL), one 5 oz glass of wine (148 mL), or one 1 oz glass of hard liquor (44 mL).  Stop drinking caffeine, or reduce how much caffeine you drink. General instructions   Keep a journal to find out if certain things bring on headaches. For example, write down: ? What you eat and drink. ? How much sleep you get. ? Any change to your diet or medicines.  Get a massage or try other ways to relax.  Limit stress.  Sit up straight. Do not tighten (tense) your  muscles.  Do not use any products that contain nicotine or tobacco. This includes cigarettes, e-cigarettes, and chewing tobacco. If you need help quitting, ask your doctor.  Exercise regularly as told by your doctor.  Get enough sleep. This often means 7-9 hours of sleep each night.  Keep all follow-up visits as told by your doctor. This is important. Contact a doctor if:  Your symptoms are not helped by medicine.  You have a headache that feels different than the other headaches.  You feel sick to your stomach (nauseous) or you throw up (vomit).  You have a fever. Get help right away if:  Your headache gets very bad quickly.  Your headache gets worse after a lot of physical activity.  You keep throwing up.  You have a stiff neck.  You have trouble seeing.  You have trouble speaking.  You have pain in the eye or ear.  Your muscles are weak or you lose muscle control.  You lose your balance or have trouble walking.  You feel like you will pass out (faint) or you pass out.  You are mixed up (confused).  You have a seizure. Summary  A headache is pain or discomfort that is felt around the head or neck area.  There are  many causes and types of headaches. In some cases, the cause may not be found.  Keep a journal to help find out what causes your headaches. Watch your condition for any changes. Let your doctor know about them.  Contact a doctor if you have a headache that is different from usual, or if your headache is not helped by medicine.  Get help right away if your headache gets very bad, you throw up, you have trouble seeing, you lose your balance, or you have a seizure. This information is not intended to replace advice given to you by your health care provider. Make sure you discuss any questions you have with your health care provider. Document Revised: 12/07/2017 Document Reviewed: 12/07/2017 Elsevier Patient Education  2020 Elsevier Inc.    Trigeminal Neuralgia  Trigeminal neuralgia is a nerve disorder that causes severe pain on one side of the face. The pain may last from a few seconds to several minutes. The pain is usually only on one side of the face. Symptoms may occur for days, weeks, or months and then go away for months or years. The pain may return and be worse than before. What are the causes? This condition is caused by damage or pressure to a nerve in the head that is called the trigeminal nerve. An attack can be triggered by:  Talking.  Chewing.  Putting on makeup.  Washing your face.  Shaving your face.  Brushing your teeth.  Touching your face. What increases the risk? You are more likely to develop this condition if you:  Are 8 years of age or older.  Are female. What are the signs or symptoms? The main symptom of this condition is severe pain in the:  Jaw.  Lips.  Eyes.  Nose.  Scalp.  Forehead.  Face. The pain may be:  Intense.  Stabbing.  Electric.  Shock-like. How is this diagnosed? This condition is diagnosed with a physical exam. A CT scan or an MRI may be done to rule out other conditions that can cause facial pain. How is this  treated? This condition may be treated with:  Avoiding the things that trigger your symptoms.  Taking prescription medicines (anticonvulsants).  Having surgery.  This may be done in severe cases if other medical treatment does not provide relief.  Having procedures such as ablation, thermal, or radiation therapy. It may take up to one month for treatment to start relieving the pain. Follow these instructions at home: Managing pain  Learn as much as you can about how to manage your pain. Ask your health care provider if a pain specialist would be helpful.  Consider talking with a mental health care provider (psychologist) about how to cope with the pain.  Consider joining a pain support group. General instructions  Take over-the-counter and prescription medicines only as told by your health care provider.  Avoid the things that trigger your symptoms. It may help to: ? Chew on the unaffected side of your mouth. ? Avoid touching your face. ? Avoid blasts of hot or cold air.  Follow your treatment plan as told by your health care provider. This may include: ? Cognitive or behavioral therapy. ? Gentle, regular exercise. ? Meditation or yoga. ? Aromatherapy.  Keep all follow-up visits as told by your health care provider. You may need to be monitored closely to make sure treatment is working well for you. Where to find more information  Facial Pain Association: fpa-support.org Contact a health care provider if:  Your medicine is not helping your symptoms.  You have side effects from the medicine used for treatment.  You develop new, unexplained symptoms, such as: ? Double vision. ? Facial weakness. ? Facial numbness. ? Changes in hearing or balance.  You feel depressed. Get help right away if:  Your pain is severe and is not getting better.  You develop suicidal thoughts. If you ever feel like you may hurt yourself or others, or have thoughts about taking your own  life, get help right away. You can go to your nearest emergency department or call:  Your local emergency services (911 in the [place]  A suicide crisis helpline, such as the National Suicide Prevention Lifeline at 2123351329. This is open 24 hours a day. Summary  Trigeminal neuralgia is a nerve disorder that causes severe pain on one side of the face. The pain may last from a few seconds to several minutes.  This condition is caused by damage or pressure to a nerve in the head that is called the trigeminal nerve.  Treatment may include avoiding the things that trigger your symptoms, taking medicines, or having surgery or procedures. It may take up to one month for treatment to start relieving the pain.  Avoid the things that trigger your symptoms.  Keep all follow-up visits as told by your health care provider. You may need to be monitored closely to make sure treatment is working well for you. This information is not intended to replace advice given to you by your health care provider. Make sure you discuss any questions you have with your health care provider. Document Revised: 04/05/2018 Document Reviewed: 04/05/2018 Elsevier Patient Education  2020 [employer].

## 2020-03-26 NOTE — Progress Notes (Signed)
I reviewed note and agree with plan.   Renette Hsu R. Laurynn Mccorvey, MD 03/26/2020, 5:00 PM Certified in Neurology, Neurophysiology and Neuroimaging  [facility] [address], [address] [place], [place] [address] [phone]  

## 2020-10-25 ENCOUNTER — Other Ambulatory Visit (HOSPITAL_COMMUNITY): Payer: Self-pay

## 2020-10-25 MED ORDER — CHLORHEXIDINE GLUCONATE 0.12 % MT SOLN
OROMUCOSAL | 0 refills | Status: AC
  Filled 2020-10-25: qty 473, 15d supply, fill #0

## 2020-11-04 ENCOUNTER — Encounter (HOSPITAL_COMMUNITY): Payer: Self-pay | Admitting: Emergency Medicine

## 2020-11-04 ENCOUNTER — Emergency Department (HOSPITAL_COMMUNITY)
Admission: EM | Admit: 2020-11-04 | Discharge: 2020-11-04 | Disposition: A | Discharge status: Home / Self Care | Payer: 59 | Attending: Emergency Medicine | Admitting: Emergency Medicine

## 2020-11-04 ENCOUNTER — Other Ambulatory Visit: Payer: Self-pay

## 2020-11-04 DIAGNOSIS — J45909 Unspecified asthma, uncomplicated: Secondary | ICD-10-CM | POA: Diagnosis not present

## 2020-11-04 DIAGNOSIS — H60501 Unspecified acute noninfective otitis externa, right ear: Secondary | ICD-10-CM | POA: Diagnosis not present

## 2020-11-04 DIAGNOSIS — H9201 Otalgia, right ear: Secondary | ICD-10-CM | POA: Diagnosis present

## 2020-11-04 MED ORDER — CIPROFLOXACIN-DEXAMETHASONE 0.3-0.1 % OT SUSP
4.0000 [drp] | Freq: Two times a day (BID) | OTIC | 0 refills | Status: AC
  Filled 2020-11-04: qty 5, 13d supply, fill #0

## 2020-11-04 NOTE — ED Triage Notes (Signed)
Reports ear pain since Saturday. She states she had her hair washed on Friday and noticed water was trapped in her ear. Reports the next morning the right ear was swollen and she could not hear well out of that side. The right side is inflamed and painful to touch.

## 2020-11-04 NOTE — ED Provider Notes (Signed)
[facility]-EMERGENCY DEPT Provider Note   CSN: [id number] Arrival date & time: 11/04/20  1312     History Chief Complaint  Patient presents with  . Otalgia    Nicole Brooks is a 35 y.o. female presenting to the ED for right ear pain for the past 2 days.  Believes that she had water trapped in her ear while getting her hair done 2 days ago.  Reports increased pain, swelling and drainage from the area.  States that last night she and her husband tried to insert a Q-tip in the ear because they thought it was due to wax buildup.  She denies any fevers or trauma to the area  HPI     Past Medical History:  Diagnosis Date  . Asthma   . Cardiomegaly   . Trigeminal neuralgia    right    Patient Active Problem List   Diagnosis Date Noted  . Therapeutic drug monitoring 12/01/2017  . Trigeminal neuralgia of right side of face 08/21/2017  . Encounter for sterilization   . Normal labor 04/04/2017  . S/P cesarean section 02/22/2015  . Obesity complicating pregnancy in second trimester   . History of cardiomegaly 08/29/2014  . Migraine 05/12/2012    Past Surgical History:  Procedure Laterality Date  . CESAREAN SECTION N/A 02/19/2015   Procedure: CESAREAN SECTION;  Surgeon: Levie Heritage, DO;  Location: [facility] ORS;  Service: Obstetrics;  Laterality: N/A;  . DILATION AND EVACUATION  05/17/2012   Procedure: DILATATION AND EVACUATION;  Surgeon: Willodean Rosenthal, MD;  Location: [facility] ORS;  Service: Gynecology;  Laterality: N/A;  . DILATION AND EVACUATION  05/17/2012  . GAMMA KNIFE  10/2019   [facility]  . TOOTH EXTRACTION    . TUBAL LIGATION Bilateral 04/05/2017   Procedure: POST PARTUM TUBAL LIGATION;  Surgeon: Reva Bores, MD;  Location: [facility]IR[facility];  Service: Gynecology;  Laterality: Bilateral;     OB History    Gravida  3   Para  2   Term  2   Preterm  0   AB  1   Living  2     SAB  1   IAB  0   Ectopic  0   Multiple  0    Live Births  2           Family History  Problem Relation Age of Onset  . Diabetes Mother   . Hypertension Mother     Social History   Tobacco Use  . Smoking status: Never Smoker  . Smokeless tobacco: Never Used  Vaping Use  . Vaping Use: Never used  Substance Use Topics  . Alcohol use: No  . Drug use: No    Home Medications Prior to Admission medications   Medication Sig Start Date End Date Taking? Authorizing Provider  ciprofloxacin-dexamethasone (CIPRODEX) OTIC suspension Place 4 drops into the right ear 2 (two) times daily for 7 days. 11/04/20 11/11/20 Yes Kylie Simmonds, PA-C  chlorhexidine (PERIDEX) 0.12 % solution Rinse with 15 ml for 30 seconds 2 times daily morning and evening. Use for 2 weeks then discontinue. 10/23/20     ibuprofen (ADVIL) 100 MG/5ML suspension Take 200 mg by mouth every 6 (six) hours.    [provider]  topiramate (TOPAMAX) 25 MG tablet Take 1 tablet (25 mg total) by mouth 2 (two) times daily. 03/19/20   Lomax, Amy, NP    Allergies    Patient has no known allergies.  Review of Systems   Review of Systems  Constitutional: Negative for chills and fever.  HENT: Positive for ear discharge and ear pain. Negative for facial swelling, sinus pressure and trouble swallowing.     Physical Exam Updated Vital Signs BP 119/82   Pulse 84   Temp 98.7 F (37.1 C) (Oral)   Resp 18   SpO2 98%   Physical Exam Vitals and nursing note reviewed.  Constitutional:      General: She is not in acute distress.    Appearance: She is well-developed. She is not diaphoretic.  HENT:     Head: Normocephalic and atraumatic.     Right Ear: Drainage and swelling present. No mastoid tenderness. Tympanic membrane is not scarred or erythematous.     Ears:     Comments: There is tenderness palpation of the tragus as well as swelling noted.  There is some discharge noted as well.  TM appears intact. Eyes:     General: No scleral icterus.     Conjunctiva/sclera: Conjunctivae normal.  Pulmonary:     Effort: Pulmonary effort is normal. No respiratory distress.  Musculoskeletal:     Cervical back: Normal range of motion.  Skin:    Findings: No rash.  Neurological:     Mental Status: She is alert.     ED Results / Procedures / Treatments   Labs (all labs ordered are listed, but only abnormal results are displayed) Labs Reviewed - No data to display  EKG None  Radiology No results found.  Procedures Procedures   Medications Ordered in ED Medications - No data to display  ED Course  I have reviewed the triage vital signs and the nursing notes.  Pertinent labs & imaging results that were available during my care of the patient were reviewed by me and considered in my medical decision making (see chart for details).    MDM Rules/Calculators/A&P                          35 year old female presenting to the ED for right ear pain, swelling and drainage.  She believes that there was water trapped in her ear 2 days ago while getting her hair done.  On physical exam there is concern for otitis externa with tenderness of the tragus.  No mastoid tenderness that would concerning for mastoiditis.  TM is intact.  She does have some cerumen but I feel that the cause of her issues is the otitis externa.  We will treat with Ciprodex drops and have her follow-up with ENT.  Return precautions given    Patient is hemodynamically stable, in NAD, and able to ambulate in the ED. Evaluation does not show pathology that would require ongoing emergent intervention or inpatient treatment. I explained the diagnosis to the patient. Pain has been managed and has no complaints prior to discharge. Patient is comfortable with above plan and is stable for discharge at this time. All questions were answered prior to disposition. Strict return precautions for returning to the ED were discussed. Encouraged follow up with PCP.   An After Visit Summary  was printed and given to the patient.   Portions of this note were generated with Scientist, clinical (histocompatibility and immunogenetics). Dictation errors may occur despite best attempts at proofreading.  Final Clinical Impression(s) / ED Diagnoses Final diagnoses:  Acute otitis externa of right ear, unspecified type    Rx / DC Orders ED Discharge Orders  Ordered    ciprofloxacin-dexamethasone (CIPRODEX) OTIC suspension  2 times daily        11/04/20 1328           Dietrich Pates, PA-C 11/04/20 1331    Lorre Nick, MD 11/04/20 [phone]

## 2020-11-04 NOTE — Discharge Instructions (Signed)
Use the drops as directed. Tylenol and Motrin as needed for pain. Follow-up with the ENT doctor listed below as well as your PCP if your symptoms do not improve. Return to the ER for increased swelling, fever, pain behind your ear.

## 2020-11-05 ENCOUNTER — Other Ambulatory Visit (HOSPITAL_COMMUNITY): Payer: Self-pay

## 2020-11-06 ENCOUNTER — Other Ambulatory Visit (HOSPITAL_COMMUNITY): Payer: Self-pay

## 2020-11-27 DIAGNOSIS — H6121 Impacted cerumen, right ear: Secondary | ICD-10-CM | POA: Diagnosis not present

## 2021-01-17 ENCOUNTER — Ambulatory Visit (HOSPITAL_COMMUNITY)
Admission: EM | Admit: 2021-01-17 | Discharge: 2021-01-17 | Disposition: A | Discharge status: Home / Self Care | Payer: 59 | Attending: Family Medicine | Admitting: Family Medicine

## 2021-01-17 ENCOUNTER — Other Ambulatory Visit (HOSPITAL_COMMUNITY): Payer: Self-pay

## 2021-01-17 ENCOUNTER — Encounter (HOSPITAL_COMMUNITY): Payer: Self-pay

## 2021-01-17 ENCOUNTER — Other Ambulatory Visit: Payer: Self-pay

## 2021-01-17 DIAGNOSIS — G43909 Migraine, unspecified, not intractable, without status migrainosus: Secondary | ICD-10-CM

## 2021-01-17 MED ORDER — SUMATRIPTAN SUCCINATE 6 MG/0.5ML ~~LOC~~ SOLN
6.0000 mg | Freq: Once | SUBCUTANEOUS | Status: AC
  Administered 2021-01-17: 6 mg via SUBCUTANEOUS

## 2021-01-17 MED ORDER — DEXAMETHASONE SODIUM PHOSPHATE 10 MG/ML IJ SOLN
10.0000 mg | Freq: Once | INTRAMUSCULAR | Status: AC
  Administered 2021-01-17: 10 mg via INTRAMUSCULAR

## 2021-01-17 MED ORDER — KETOROLAC TROMETHAMINE 30 MG/ML IJ SOLN
INTRAMUSCULAR | Status: AC
  Filled 2021-01-17: qty 1

## 2021-01-17 MED ORDER — KETOROLAC TROMETHAMINE 30 MG/ML IJ SOLN
30.0000 mg | Freq: Once | INTRAMUSCULAR | Status: AC
  Administered 2021-01-17: 30 mg via INTRAMUSCULAR

## 2021-01-17 MED ORDER — DEXAMETHASONE SODIUM PHOSPHATE 10 MG/ML IJ SOLN
INTRAMUSCULAR | Status: AC
  Filled 2021-01-17: qty 1

## 2021-01-17 MED ORDER — SUMATRIPTAN SUCCINATE 6 MG/0.5ML ~~LOC~~ SOLN
SUBCUTANEOUS | Status: AC
  Filled 2021-01-17: qty 0.5

## 2021-01-17 MED ORDER — SUMATRIPTAN SUCCINATE 50 MG PO TABS
ORAL_TABLET | ORAL | 0 refills | Status: AC
  Filled 2021-01-17: qty 9, 30d supply, fill #0

## 2021-01-17 NOTE — ED Triage Notes (Signed)
Patient presents to Urgent Care with complaints of headache, some light sensitivity, and lightheaded since today. She describes pain as a throbbing  sensation to left side of head. Treating pain with ibuprofen 800 mg. She states her co-workers stated to her "that her eyes look weak." Pt states she has a hx of migraines.   Denies changes in vision, no numbness or tingling, n/v, or diarrhea.

## 2021-01-17 NOTE — ED Provider Notes (Signed)
[facility]    CSN: [id number] Arrival date & time: 01/17/21  1355      History   Chief Complaint Chief Complaint  Patient presents with   Headache    HPI Nicole Brooks is a 35 y.o. female.   Patient presenting today with a right-sided migraine that started around 11 AM today.  She states she is had some photophobia but denies any dizziness, loss of vision, fevers, chills, nausea, vomiting.  No other associated abnormal symptoms at this time.  History of right-sided trigeminal neuralgia.  Used to be on Topamax but no longer on this.  Took an ibuprofen 800 mg this morning with no relief.   Past Medical History:  Diagnosis Date   Asthma    Cardiomegaly    Trigeminal neuralgia    right    Patient Active Problem List   Diagnosis Date Noted   Therapeutic drug monitoring 12/01/2017   Trigeminal neuralgia of right side of face 08/21/2017   Encounter for sterilization    Normal labor 04/04/2017   S/P cesarean section 02/22/2015   Obesity complicating pregnancy in second trimester    History of cardiomegaly 08/29/2014   Migraine 05/12/2012    Past Surgical History:  Procedure Laterality Date   CESAREAN SECTION N/A 02/19/2015   Procedure: CESAREAN SECTION;  Surgeon: Levie Heritage, DO;  Location: [facility];  Service: Obstetrics;  Laterality: N/A;   DILATION AND EVACUATION  05/17/2012   Procedure: DILATATION AND EVACUATION;  Surgeon: Willodean Rosenthal, MD;  Location: [facility];  Service: Gynecology;  Laterality: N/A;   DILATION AND EVACUATION  05/17/2012   GAMMA KNIFE  10/2019   [facility]   TOOTH EXTRACTION     TUBAL LIGATION Bilateral 04/05/2017   Procedure: POST PARTUM TUBAL LIGATION;  Surgeon: Reva Bores, MD;  Location: [facility]TH[facility];  Service: Gynecology;  Laterality: Bilateral;    OB History     Gravida  3   Para  2   Term  2   Preterm  0   AB  1   Living  2      SAB  1   IAB  0   Ectopic  0   Multiple  0   Live  Births  2            Home Medications    Prior to Admission medications   Medication Sig Start Date End Date Taking? Authorizing Provider  SUMAtriptan (IMITREX) 50 MG tablet Take 1 tablet by mouth at onset of migraine. May repeat in 2 hours if headache persists or recurs. Max of 2 tabs daily 01/17/21  Yes Particia Nearing, PA-C  chlorhexidine (PERIDEX) 0.12 % solution Rinse with 15 ml for 30 seconds 2 times daily morning and evening. Use for 2 weeks then discontinue. 10/23/20     ibuprofen (ADVIL) 100 MG/5ML suspension Take 200 mg by mouth every 6 (six) hours.    [provider]  topiramate (TOPAMAX) 25 MG tablet Take 1 tablet (25 mg total) by mouth 2 (two) times daily. 03/19/20   Lomax, Amy, NP    Family History Family History  Problem Relation Age of Onset   Diabetes Mother    Hypertension Mother     Social History Social History   Tobacco Use   Smoking status: Never   Smokeless tobacco: Never  Vaping Use   Vaping Use: Never used  Substance Use Topics   Alcohol use: No   Drug use: No  Allergies   Patient has no known allergies.   Review of Systems Review of Systems Per HPI  Physical Exam Triage Vital Signs ED Triage Vitals  Enc Vitals Group     BP 01/17/21 1427 122/87     Pulse Rate 01/17/21 1427 86     Resp 01/17/21 1427 16     Temp 01/17/21 1427 98.6 F (37 C)     Temp Source 01/17/21 1427 Oral     SpO2 01/17/21 1427 98 %     Weight --      Height --      Head Circumference --      Peak Flow --      Pain Score 01/17/21 1425 8     Pain Loc --      Pain Edu? --      Excl. in GC? --    No data found.  Updated Vital Signs BP 122/87 (BP Location: Right Arm)   Pulse 86   Temp 98.6 F (37 C) (Oral)   Resp 16   LMP 12/24/2020 (Approximate) Comment: End of july  SpO2 98%   Visual Acuity Right Eye Distance:   Left Eye Distance:   Bilateral Distance:    Right Eye Near:   Left Eye Near:    Bilateral Near:     Physical  Exam Vitals and nursing note reviewed.  Constitutional:      Appearance: Normal appearance. She is not ill-appearing.  HENT:     Head: Atraumatic.     Mouth/Throat:     Mouth: Mucous membranes are moist.     Pharynx: Oropharynx is clear.  Eyes:     Extraocular Movements: Extraocular movements intact.     Conjunctiva/sclera: Conjunctivae normal.     Pupils: Pupils are equal, round, and reactive to light.  Cardiovascular:     Rate and Rhythm: Normal rate and regular rhythm.     Heart sounds: Normal heart sounds.  Pulmonary:     Effort: Pulmonary effort is normal.     Breath sounds: Normal breath sounds. No wheezing or rales.  Abdominal:     General: Bowel sounds are normal. There is no distension.     Palpations: Abdomen is soft.     Tenderness: There is no abdominal tenderness. There is no guarding.  Musculoskeletal:        General: Normal range of motion.     Cervical back: Normal range of motion and neck supple.  Skin:    General: Skin is warm and dry.  Neurological:     General: No focal deficit present.     Mental Status: She is alert and oriented to person, place, and time.     Motor: No weakness.     Gait: Gait normal.  Psychiatric:        Mood and Affect: Mood normal.        Thought Content: Thought content normal.        Judgment: Judgment normal.   UC Treatments / Results  Labs (all labs ordered are listed, but only abnormal results are displayed) Labs Reviewed - No data to display  EKG   Radiology No results found.  Procedures Procedures (including critical care time)  Medications Ordered in UC Medications  dexamethasone (DECADRON) injection 10 mg (has no administration in time range)  SUMAtriptan (IMITREX) injection 6 mg (has no administration in time range)  ketorolac (TORADOL) 30 MG/ML injection 30 mg (30 mg Intramuscular Given 01/17/21 1519)    Initial Impression /  Assessment and Plan / UC Course  I have reviewed the triage vital signs and the  nursing notes.  Pertinent labs & imaging results that were available during my care of the patient were reviewed by me and considered in my medical decision making (see chart for details).     Exam and vitals reassuring, will give IM migraine medications including Toradol, Decadron, Imitrex and sent Imitrex for as needed use additionally if migraine returns.  Work note given.  Return for acutely worsening symptoms.  Final Clinical Impressions(s) / UC Diagnoses   Final diagnoses:  Migraine without status migrainosus, not intractable, unspecified migraine type   Discharge Instructions   None    ED Prescriptions     Medication Sig Dispense Auth. Provider   SUMAtriptan (IMITREX) 50 MG tablet Take 1 tablet by mouth at onset of migraine. May repeat in 2 hours if headache persists or recurs. Max of 2 tabs daily 10 tablet Particia Nearing, [place]      PDMP not reviewed this encounter.   Particia Nearing, [place] 01/17/21 1524

## 2021-01-22 DIAGNOSIS — Z0000 Encounter for general adult medical examination without abnormal findings: Secondary | ICD-10-CM | POA: Diagnosis not present

## 2021-01-22 DIAGNOSIS — Z13220 Encounter for screening for lipoid disorders: Secondary | ICD-10-CM | POA: Diagnosis not present

## 2021-01-22 DIAGNOSIS — Z131 Encounter for screening for diabetes mellitus: Secondary | ICD-10-CM | POA: Diagnosis not present

## 2021-01-22 DIAGNOSIS — G500 Trigeminal neuralgia: Secondary | ICD-10-CM | POA: Diagnosis not present

## 2021-07-22 ENCOUNTER — Other Ambulatory Visit (HOSPITAL_COMMUNITY): Payer: Self-pay

## 2021-07-22 ENCOUNTER — Ambulatory Visit (INDEPENDENT_AMBULATORY_CARE_PROVIDER_SITE_OTHER): Payer: 59 | Admitting: Podiatry

## 2021-07-22 ENCOUNTER — Other Ambulatory Visit: Payer: Self-pay

## 2021-07-22 DIAGNOSIS — B353 Tinea pedis: Secondary | ICD-10-CM

## 2021-07-22 MED ORDER — CLOTRIMAZOLE-BETAMETHASONE 1-0.05 % EX CREA
1.0000 "application " | TOPICAL_CREAM | Freq: Two times a day (BID) | CUTANEOUS | 3 refills | Status: DC
  Filled 2021-07-22: qty 45, 23d supply, fill #0
  Filled 2021-08-02: qty 45, 15d supply, fill #1
  Filled 2021-08-09: qty 45, 14d supply, fill #1

## 2021-07-22 MED ORDER — TERBINAFINE HCL 250 MG PO TABS
250.0000 mg | ORAL_TABLET | Freq: Every day | ORAL | 0 refills | Status: DC
  Filled 2021-07-22: qty 30, 30d supply, fill #0

## 2021-07-22 NOTE — Progress Notes (Signed)
° °  HPI: 36 y.o. female presenting today as a new patient for evaluation of chronic itching and burning and peeling of the bilateral feet.  She is concerned for chronic athlete's foot.  This is been constant for several years now.  The patient has tried over-the-counter antifungal foot sprays with no improvement.  She presents for further treatment and evaluation  Past Medical History:  Diagnosis Date   Asthma    Cardiomegaly    Trigeminal neuralgia    right    Past Surgical History:  Procedure Laterality Date   CESAREAN SECTION N/A 02/19/2015   Procedure: CESAREAN SECTION;  Surgeon: Levie Heritage, DO;  Location: [facility];  Service: Obstetrics;  Laterality: N/A;   DILATION AND EVACUATION  05/17/2012   Procedure: DILATATION AND EVACUATION;  Surgeon: Willodean Rosenthal, MD;  Location: [facility];  Service: Gynecology;  Laterality: N/A;   DILATION AND EVACUATION  05/17/2012   GAMMA KNIFE  10/2019   [facility]   TOOTH EXTRACTION     TUBAL LIGATION Bilateral 04/05/2017   Procedure: POST PARTUM TUBAL LIGATION;  Surgeon: Reva Bores, MD;  Location: [facility]IRTHING SU[facility];  Service: Gynecology;  Laterality: Bilateral;    No Known Allergies   Physical Exam: General: The patient is alert and oriented x3 in no acute distress.  Dermatology: Skin is warm, dry and supple bilateral lower extremities. Negative for open lesions or macerations.  Diffuse hyperkeratosis of skin with peeling and burning sensation.  There is some slight maceration to the interdigital areas as well  Vascular: Palpable pedal pulses bilaterally. Capillary refill within normal limits.  Negative for any significant edema or erythema  Neurological: Light touch and protective threshold grossly intact  Musculoskeletal Exam: No pedal deformities noted  Assessment: 1.  Tinea pedis bilateral   Plan of Care:  1. Patient evaluated.  2.  Today we discussed different treatment options for the chronic tinea pedis. 3.   Prescription for Lamisil 250 mg #30 daily.  Patient denies a history of liver pathology or symptoms.  Expected to be only on a short course of the oral Lamisil. 4.  Prescription for Lotrisone cream 5.  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 Felecia Shelling, DPM    [address] [place], [place] [address]                Office [phone]  Fax [phone]

## 2021-08-02 ENCOUNTER — Other Ambulatory Visit (HOSPITAL_COMMUNITY): Payer: Self-pay

## 2021-08-06 ENCOUNTER — Other Ambulatory Visit (HOSPITAL_COMMUNITY): Payer: Self-pay

## 2021-08-09 ENCOUNTER — Other Ambulatory Visit (HOSPITAL_COMMUNITY): Payer: Self-pay

## 2021-08-19 ENCOUNTER — Ambulatory Visit: Payer: 59 | Admitting: Podiatry

## 2021-08-26 ENCOUNTER — Other Ambulatory Visit: Payer: Self-pay

## 2021-08-26 ENCOUNTER — Ambulatory Visit (INDEPENDENT_AMBULATORY_CARE_PROVIDER_SITE_OTHER): Payer: 59 | Admitting: Podiatry

## 2021-08-26 ENCOUNTER — Other Ambulatory Visit (HOSPITAL_COMMUNITY): Payer: Self-pay

## 2021-08-26 DIAGNOSIS — B353 Tinea pedis: Secondary | ICD-10-CM | POA: Diagnosis not present

## 2021-08-26 MED ORDER — CLOTRIMAZOLE-BETAMETHASONE 1-0.05 % EX CREA
1.0000 "application " | TOPICAL_CREAM | Freq: Two times a day (BID) | CUTANEOUS | 3 refills | Status: AC
  Filled 2021-08-26: qty 45, 23d supply, fill #0

## 2021-08-26 MED ORDER — TERBINAFINE HCL 250 MG PO TABS
250.0000 mg | ORAL_TABLET | Freq: Every day | ORAL | 0 refills | Status: AC
  Filled 2021-08-26: qty 30, 30d supply, fill #0

## 2021-08-26 NOTE — Progress Notes (Signed)
? ?  HPI: 36 y.o. 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  Patient states that she is doing much better.  The antifungal pills and cream have helped tremendously.  She continues to have some residual itching and burning sensation however.  She presents for follow-up treatment and evaluation ? ?Past Medical History:  ?Diagnosis Date  ? Asthma   ? Cardiomegaly   ? Trigeminal neuralgia   ? right  ? ? ?Past Surgical History:  ?Procedure Laterality Date  ? CESAREAN SECTION N/A 02/19/2015  ? Procedure: CESAREAN SECTION;  Surgeon: Truett Mainland, DO;  Location: [facility];  Service: Obstetrics;  Laterality: N/A;  ? DILATION AND EVACUATION  05/17/2012  ? Procedure: DILATATION AND EVACUATION;  Surgeon: Lavonia Drafts, MD;  Location: [facility];  Service: Gynecology;  Laterality: N/A;  ? DILATION AND EVACUATION  05/17/2012  ? GAMMA KNIFE  10/2019  ? [place] EXTRACTION    ? TUBAL LIGATION Bilateral 04/05/2017  ? Procedure: POST PARTUM TUBAL LIGATION;  Surgeon: Donnamae Jude, MD;  Location: [facility];  Service: Gynecology;  Laterality: Bilateral;  ? ? ?No Known Allergies ?  ?Physical Exam: ?General: The patient is alert and oriented x3 in no acute distress. ? ?Dermatology: There continues to be some slight peeling of skin to the interdigital areas of the feet.  Patient also describes some residual itching/pruritus to the feet although this has significantly improved over the past month. ? ?Vascular: Palpable pedal pulses bilaterally. Capillary refill within normal limits.  Negative for any significant edema or erythema ? ?Neurological: Light touch and protective threshold grossly intact ? ?Musculoskeletal Exam: No pedal deformities noted ? ?Assessment: ?1.  Tinea pedis bilateral ? ? ?Plan of Care:  ?1. Patient evaluated.  ?2.  Today we will prescribe 1 final refill of Lamisil 20 mg #30 daily. ?3.  Refill prescription for Lotrisone cream  apply 2 times daily ?4.  Return to clinic as needed ? 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 ?  ?  ?Edrick Kins, DPM ?[place] ? ?Dr. Edrick Kins, DPM  ?  ?[address]                                        ?[place], [place] [address]                ?Office [phone]  ?Fax [phone] ? ? ? ? ?

## 2021-10-30 ENCOUNTER — Other Ambulatory Visit: Payer: Self-pay

## 2022-01-28 ENCOUNTER — Other Ambulatory Visit (HOSPITAL_COMMUNITY): Payer: Self-pay

## 2022-01-28 DIAGNOSIS — Z0000 Encounter for general adult medical examination without abnormal findings: Secondary | ICD-10-CM | POA: Diagnosis not present

## 2022-01-28 DIAGNOSIS — Z13220 Encounter for screening for lipoid disorders: Secondary | ICD-10-CM | POA: Diagnosis not present

## 2022-01-28 DIAGNOSIS — G500 Trigeminal neuralgia: Secondary | ICD-10-CM | POA: Diagnosis not present

## 2022-01-28 DIAGNOSIS — Z6837 Body mass index (BMI) 37.0-37.9, adult: Secondary | ICD-10-CM | POA: Diagnosis not present

## 2022-01-28 MED ORDER — AMITRIPTYLINE HCL 25 MG PO TABS
25.0000 mg | ORAL_TABLET | Freq: Every evening | ORAL | 1 refills | Status: AC
  Filled 2022-01-28: qty 30, 30d supply, fill #0

## 2022-03-24 ENCOUNTER — Other Ambulatory Visit (HOSPITAL_COMMUNITY): Payer: Self-pay

## 2022-03-24 MED ORDER — CHLORHEXIDINE GLUCONATE 0.12 % MT SOLN
15.0000 mL | Freq: Two times a day (BID) | OROMUCOSAL | 0 refills | Status: AC
  Filled 2022-03-24: qty 473, 16d supply, fill #0
  Filled 2022-03-24: qty 473, 14d supply, fill #0

## 2022-03-26 ENCOUNTER — Other Ambulatory Visit (HOSPITAL_COMMUNITY): Payer: Self-pay

## 2022-03-27 ENCOUNTER — Other Ambulatory Visit: Payer: Self-pay

## 2022-03-27 ENCOUNTER — Emergency Department (HOSPITAL_COMMUNITY)
Admission: EM | Admit: 2022-03-27 | Discharge: 2022-03-27 | Disposition: A | Discharge status: Home / Self Care | Payer: 59 | Attending: Emergency Medicine | Admitting: Emergency Medicine

## 2022-03-27 ENCOUNTER — Encounter (HOSPITAL_COMMUNITY): Payer: Self-pay | Admitting: *Deleted

## 2022-03-27 ENCOUNTER — Emergency Department (HOSPITAL_COMMUNITY): Payer: 59

## 2022-03-27 DIAGNOSIS — S93402A Sprain of unspecified ligament of left ankle, initial encounter: Secondary | ICD-10-CM | POA: Diagnosis not present

## 2022-03-27 DIAGNOSIS — S99912A Unspecified injury of left ankle, initial encounter: Secondary | ICD-10-CM | POA: Diagnosis present

## 2022-03-27 DIAGNOSIS — S90822A Blister (nonthermal), left foot, initial encounter: Secondary | ICD-10-CM | POA: Diagnosis not present

## 2022-03-27 DIAGNOSIS — X501XXA Overexertion from prolonged static or awkward postures, initial encounter: Secondary | ICD-10-CM | POA: Diagnosis not present

## 2022-03-27 DIAGNOSIS — S90522A Blister (nonthermal), left ankle, initial encounter: Secondary | ICD-10-CM | POA: Insufficient documentation

## 2022-03-27 DIAGNOSIS — Y9301 Activity, walking, marching and hiking: Secondary | ICD-10-CM | POA: Diagnosis not present

## 2022-03-27 NOTE — Discharge Instructions (Signed)
Please avoid rupturing the blister.  You may apply Neosporin cream over the skin as needed to decrease infection.  Return if you have any concern.

## 2022-03-27 NOTE — ED Provider Notes (Signed)
[facility] DEPT Provider Note   CSN: [id number] Arrival date & time: 03/27/22  1141     History  Chief Complaint  Patient presents with   Ankle Pain    Nicole Brooks is a 36 y.o. female.  The history is provided by the patient and medical records. No language interpreter was used.  Ankle Pain    36 year old female presenting for evaluation of left ankle injury.  Patient reports 4 days ago she was walking and accidentally twisted her left ankle.  She did not notice any significant pain and was able to ambulate afterward.  However the next day she noticed several blisters that popped up at the affected site.  She does not endorse any significant pain and denies any itchiness.  She denies any specific trauma to her ankle.  She denies any change in her clothing drugs or anything that may cause allergic reaction.  No other treatment tried.  She reports she is able to ambulate without difficulty.  Home Medications Prior to Admission medications   Medication Sig Start Date End Date Taking? Authorizing Provider  amitriptyline (ELAVIL) 25 MG tablet Take 1 tablet (25 mg total) by mouth at bedtime. 01/28/22     chlorhexidine (PERIDEX) 0.12 % solution Rinse with 15 ml for 30 seconds 2 times daily morning and evening. Use for 2 weeks then discontinue. 10/23/20     chlorhexidine (PERIDEX) 0.12 % solution Rinse with 15 mLs for 30 seconds 2 (two) times daily in the morning and in the evening for 2 weeks and discontinue. 03/18/22     clotrimazole-betamethasone (LOTRISONE) cream Apply topically 2 times daily. 08/26/21   Edrick Kins, DPM  ibuprofen (ADVIL) 100 MG/5ML suspension Take 200 mg by mouth every 6 (six) hours.    [provider]  ibuprofen (ADVIL) 200 MG tablet Take by mouth.    [provider]  SUMAtriptan (IMITREX) 50 MG tablet Take 1 tablet by mouth at onset of migraine. May repeat in 2 hours if headache persists or recurs. Max of 2 tabs  daily 01/17/21   Volney American, PA-C  terbinafine (LAMISIL) 250 MG tablet Take 1 tablet (250 mg total) by mouth daily. 08/26/21   Edrick Kins, DPM  topiramate (TOPAMAX) 25 MG tablet Take 1 tablet (25 mg total) by mouth 2 (two) times daily. 03/19/20   Lomax, Amy, NP      Allergies    Patient has no known allergies.    Review of Systems   Review of Systems  All other systems reviewed and are negative.   Physical Exam Updated Vital Signs BP (!) 130/94 (BP Location: Left Arm)   Pulse 95   Temp 98.3 F (36.8 C) (Oral)   Resp 16   Ht 5' (1.524 m)   Wt 81.6 kg   SpO2 100%   BMI 35.15 kg/m  Physical Exam Vitals and nursing note reviewed.  Constitutional:      General: She is not in acute distress.    Appearance: She is well-developed.  HENT:     Head: Atraumatic.  Eyes:     Conjunctiva/sclera: Conjunctivae normal.  Pulmonary:     Effort: Pulmonary effort is normal.  Musculoskeletal:     Cervical back: Neck supple.  Skin:    Findings: No rash.     Comments: Left ankle: Ankle is nontender to palpation with full range of motion.  Dorsalis pedis pulse palpable with brisk cap refill.  Superior to the ankle area is  a large 3 cm blister and several smaller blisters adjacent to that.  No surrounding erythema warmth or purulent discharge.  No true vesicular lesion.  Neurological:     Mental Status: She is alert.  Psychiatric:        Mood and Affect: Mood normal.     ED Results / Procedures / Treatments   Labs (all labs ordered are listed, but only abnormal results are displayed) Labs Reviewed - No data to display  EKG None  Radiology No results found.  Procedures Procedures    Medications Ordered in ED Medications - No data to display  ED Course/ Medical Decision Making/ A&P                           Medical Decision Making Amount and/or Complexity of Data Reviewed Radiology: ordered.   BP (!) 130/94 (BP Location: Left Arm)   Pulse 95   Temp 98.3  F (36.8 C) (Oral)   Resp 16   Ht 5' (1.524 m)   Wt 81.6 kg   SpO2 100%   BMI 35.15 kg/m   90:75 PM  36 year old female presenting for evaluation of left ankle injury.  Patient reports 4 days ago she was walking and accidentally twisted her left ankle.  She did not notice any significant pain and was able to ambulate afterward.  However the next day she noticed several blisters that popped up at the affected site.  She does not endorse any significant pain and denies any itchiness.  She denies any specific trauma to her ankle.  She denies any change in her clothing drugs or anything that may cause allergic reaction.  No other treatment tried.  She reports she is able to ambulate without difficulty.  On exam, left ankle with full range of motion and no tenderness.  She does have a large 3 cm blister noted to the dorsum of her lower extremity above her ankle and several small blisters adjacent to that.  It does not appears infected.  It does not have the appearance of herpes lesion.  I have considered shingles, skin skin allergic reaction, TENS, SJS but felt that these are less likely.  The blister could be due to friction.  It does not appear to be infected.  I recommend against deroofing this blister and to monitor closely for any signs of infection.  Return precaution given.  X-ray of the left ankle obtained independently viewed interpreted by me and I agree with radiologist impression mention.  X-ray unremarkable.  No fracture or dislocation.        Final Clinical Impression(s) / ED Diagnoses Final diagnoses:  Blister (nonthermal), left ankle, initial encounter    Rx / DC Orders ED Discharge Orders     None         Fayrene Helper, PA-C 03/27/22 1332    Jacalyn Lefevre, MD 03/27/22 1435

## 2022-03-27 NOTE — ED Triage Notes (Signed)
Pt twisted left ankle on Monday, Tuesday blister appeared, Blister remains intact, states she feels like it is swollen. Ambulatory on foot

## 2022-05-29 ENCOUNTER — Encounter (HOSPITAL_COMMUNITY): Payer: Self-pay

## 2022-05-29 ENCOUNTER — Emergency Department (HOSPITAL_COMMUNITY)
Admission: EM | Admit: 2022-05-29 | Discharge: 2022-05-29 | Disposition: A | Discharge status: Home / Self Care | Payer: 59 | Attending: Emergency Medicine | Admitting: Emergency Medicine

## 2022-05-29 ENCOUNTER — Other Ambulatory Visit: Payer: Self-pay

## 2022-05-29 DIAGNOSIS — J45909 Unspecified asthma, uncomplicated: Secondary | ICD-10-CM | POA: Insufficient documentation

## 2022-05-29 DIAGNOSIS — H01001 Unspecified blepharitis right upper eyelid: Secondary | ICD-10-CM | POA: Diagnosis not present

## 2022-05-29 DIAGNOSIS — H02841 Edema of right upper eyelid: Secondary | ICD-10-CM | POA: Diagnosis present

## 2022-05-29 NOTE — Discharge Instructions (Addendum)
Return to the ED with any new or worsening signs or symptoms Please read attached guide concerning blepharitis Please avoid any eye make-up until right upper eyelid swelling decreases If symptoms fail to resolve after 7 to 10 days, please report to PCP for further management Please begin utilizing warm compresses to right eye

## 2022-05-29 NOTE — ED Provider Notes (Signed)
[facility]ENC[facility] DEPT Provider Note   CSN: [id number] Arrival date & time: 05/29/22  1215     History  Chief Complaint  Patient presents with   Facial Swelling    Nicole Brooks is a 36 y.o. female with medical history of asthma, trigeminal neuralgia, cardiomegaly.  Patient presents to ED for evaluation of right eye swelling.  Patient reports that this morning she woke up with her right eyelid swollen.  Patient states when she went to bed last night her right eyelid was normal.  Patient denies wearing contacts, wearing glasses.  Patient reports that 1 week ago she had generalized bodyaches and chills, cough however the symptoms have resolved and she did not think much of it.  The patient denies any fevers, nausea, vomiting, loss of vision, foreign body sensation to eye, painful movement of eye.  Patient denies any discharge.  HPI     Home Medications Prior to Admission medications   Medication Sig Start Date End Date Taking? Authorizing Provider  amitriptyline (ELAVIL) 25 MG tablet Take 1 tablet (25 mg total) by mouth at bedtime. 01/28/22     chlorhexidine (PERIDEX) 0.12 % solution Rinse with 15 ml for 30 seconds 2 times daily morning and evening. Use for 2 weeks then discontinue. 10/23/20     chlorhexidine (PERIDEX) 0.12 % solution Rinse with 15 mLs for 30 seconds 2 (two) times daily in the morning and in the evening for 2 weeks and discontinue. 03/18/22     clotrimazole-betamethasone (LOTRISONE) cream Apply topically 2 times daily. 08/26/21   Felecia Shelling, DPM  ibuprofen (ADVIL) 100 MG/5ML suspension Take 200 mg by mouth every 6 (six) hours.    [provider]  ibuprofen (ADVIL) 200 MG tablet Take by mouth.    [provider]  SUMAtriptan (IMITREX) 50 MG tablet Take 1 tablet by mouth at onset of migraine. May repeat in 2 hours if headache persists or recurs. Max of 2 tabs daily 01/17/21   Particia Nearing, PA-C  terbinafine (LAMISIL) 250  MG tablet Take 1 tablet (250 mg total) by mouth daily. 08/26/21   Felecia Shelling, DPM  topiramate (TOPAMAX) 25 MG tablet Take 1 tablet (25 mg total) by mouth 2 (two) times daily. 03/19/20   Lomax, Amy, NP      Allergies    Patient has no known allergies.    Review of Systems   Review of Systems  Constitutional:  Negative for fever.  Eyes:  Negative for photophobia, discharge and visual disturbance.       Eyelid swelling  All other systems reviewed and are negative.   Physical Exam Updated Vital Signs BP 112/82 (BP Location: Right Arm)   Pulse 83   Temp 98.8 F (37.1 C) (Oral)   Resp 16   Ht 5' (1.524 m)   Wt 81.6 kg   LMP 05/05/2022 (Approximate)   SpO2 99%   BMI 35.15 kg/m  Physical Exam Vitals and nursing note reviewed.  Constitutional:      General: She is not in acute distress.    Appearance: Normal appearance. She is not ill-appearing, toxic-appearing or diaphoretic.  HENT:     Head: Normocephalic and atraumatic. No right periorbital erythema or left periorbital erythema.     Nose: Nose normal. No congestion.     Mouth/Throat:     Mouth: Mucous membranes are moist.     Pharynx: Oropharynx is clear.  Eyes:     Extraocular Movements: Extraocular movements intact.  Conjunctiva/sclera: Conjunctivae normal.     Pupils: Pupils are equal, round, and reactive to light.     Comments: Right upper eyelid swollen consistent with blepharitis, no erythema, no drainage. EOMs intact, nonpainful. No periorbital tenderness.   Cardiovascular:     Rate and Rhythm: Normal rate and regular rhythm.  Pulmonary:     Effort: Pulmonary effort is normal.     Breath sounds: Normal breath sounds. No wheezing.  Abdominal:     General: Abdomen is flat. Bowel sounds are normal.     Palpations: Abdomen is soft.     Tenderness: There is no abdominal tenderness.  Musculoskeletal:     Cervical back: Normal range of motion and neck supple.  Skin:    General: Skin is warm and dry.      Capillary Refill: Capillary refill takes less than 2 seconds.  Neurological:     Mental Status: She is alert and oriented to person, place, and time.     ED Results / Procedures / Treatments   Labs (all labs ordered are listed, but only abnormal results are displayed) Labs Reviewed - No data to display  EKG None  Radiology No results found.  Procedures Procedures   Medications Ordered in ED Medications - No data to display  ED Course/ Medical Decision Making/ A&P                           Medical Decision Making  36 year old female presents to ED for evaluation.  Please see HPI for further details.  On examination patient right upper eyelid is swollen consistent with blepharitis, no overlying skin change, no erythema.  Patient has no periorbital tenderness.  Patient has nonpainful EOMs.  Patient denies foreign body sensation.  No drainage, no crusting.  Patient afebrile nontachycardic.  Patient visual acuity intact.  Patient advised that this is most likely resulting from virus.  Patient counseled on how to perform warm compresses at home.  Patient advised to treat symptoms conservatively.  Patient provided return precautions and she voiced understanding.  Patient had other questions answered to her satisfaction.  The patient is stable to discharge.   Final Clinical Impression(s) / ED Diagnoses Final diagnoses:  Blepharitis of right upper eyelid, unspecified type    Rx / DC Orders ED Discharge Orders     None         Clent Ridges 05/29/22 1340    Benjiman Core, MD 05/29/22 [phone]

## 2022-05-29 NOTE — ED Triage Notes (Signed)
Patient reports that she began having eye irritation last night and when she woke this AM she had swelling and "crust" on her eye.

## 2022-07-09 DIAGNOSIS — H6121 Impacted cerumen, right ear: Secondary | ICD-10-CM | POA: Diagnosis not present

## 2022-07-29 DIAGNOSIS — H9041 Sensorineural hearing loss, unilateral, right ear, with unrestricted hearing on the contralateral side: Secondary | ICD-10-CM | POA: Diagnosis not present

## 2023-02-03 ENCOUNTER — Other Ambulatory Visit (HOSPITAL_COMMUNITY): Payer: Self-pay

## 2023-02-03 DIAGNOSIS — G500 Trigeminal neuralgia: Secondary | ICD-10-CM | POA: Diagnosis not present

## 2023-02-03 DIAGNOSIS — Z0000 Encounter for general adult medical examination without abnormal findings: Secondary | ICD-10-CM | POA: Diagnosis not present

## 2023-02-03 DIAGNOSIS — Z6839 Body mass index (BMI) 39.0-39.9, adult: Secondary | ICD-10-CM | POA: Diagnosis not present

## 2023-02-03 MED ORDER — TOPIRAMATE 50 MG PO TABS
50.0000 mg | ORAL_TABLET | Freq: Every day | ORAL | 1 refills | Status: AC
  Filled 2023-02-03: qty 30, 30d supply, fill #0

## 2023-02-04 ENCOUNTER — Other Ambulatory Visit (HOSPITAL_COMMUNITY): Payer: Self-pay

## 2023-05-07 DIAGNOSIS — J069 Acute upper respiratory infection, unspecified: Secondary | ICD-10-CM | POA: Diagnosis not present

## 2024-06-09 ENCOUNTER — Ambulatory Visit: Admitting: Podiatry

## 2024-06-09 ENCOUNTER — Encounter: Payer: Self-pay | Admitting: Podiatry

## 2024-06-09 DIAGNOSIS — B353 Tinea pedis: Secondary | ICD-10-CM | POA: Diagnosis not present

## 2024-06-09 MED ORDER — TERBINAFINE HCL 250 MG PO TABS: 250.0000 mg | ORAL_TABLET | Freq: Every day | ORAL | 0 refills | Status: AC

## 2024-06-09 MED ORDER — PREDNISONE 10 MG PO TABS: ORAL_TABLET | ORAL | 0 refills | Status: AC

## 2024-06-09 MED ORDER — CLOTRIMAZOLE-BETAMETHASONE 1-0.05 % EX CREA: 1.0000 | TOPICAL_CREAM | Freq: Every day | CUTANEOUS | 0 refills | Status: AC

## 2024-06-10 NOTE — Progress Notes (Signed)
 Subjective:   Patient ID: Nicole Brooks, female   DOB: 39 y.o.   MRN: [id number]   HPI Patient presents stating she has developed severe itching mechanism in both her feet and states that she had this a few years ago and its gotten bad over the last few months   ROS      Objective:  Physical Exam  Neurovascular status intact with significant irritation of skin bilateral especially left lateral foot left ankle.  All digits are involved there is cracking in between the toes     Assessment:  Significant skin fungal infection bilateral     Plan:  H&P reviewed and at this point I went ahead and I can go to start her on a protocol first for the itching with a 12-day steroid Dosepak with instructions given and Lotrisone  cream and then she will follow-up after that with 45 days of oral Lamisil .  Patient had blood work indicating good liver function and understands all risk factors.  Explained to her if it continues we will see her back
